# Patient Record
Sex: Female | Born: 1970 | Race: Black or African American | Hispanic: No | Marital: Single | Smoking: Current every day smoker
Health system: Southern US, Community
[De-identification: ages and names within clinical notes are randomized; demographics above are authoritative.]

## PROBLEM LIST (undated history)

## (undated) DIAGNOSIS — I639 Cerebral infarction, unspecified: Secondary | ICD-10-CM

## (undated) DIAGNOSIS — M51369 Other intervertebral disc degeneration, lumbar region without mention of lumbar back pain or lower extremity pain: Secondary | ICD-10-CM

## (undated) DIAGNOSIS — I219 Acute myocardial infarction, unspecified: Secondary | ICD-10-CM

## (undated) DIAGNOSIS — R0600 Dyspnea, unspecified: Secondary | ICD-10-CM

## (undated) DIAGNOSIS — I10 Essential (primary) hypertension: Secondary | ICD-10-CM

## (undated) DIAGNOSIS — E785 Hyperlipidemia, unspecified: Secondary | ICD-10-CM

## (undated) DIAGNOSIS — K219 Gastro-esophageal reflux disease without esophagitis: Secondary | ICD-10-CM

## (undated) DIAGNOSIS — M5136 Other intervertebral disc degeneration, lumbar region: Secondary | ICD-10-CM

## (undated) DIAGNOSIS — R0602 Shortness of breath: Secondary | ICD-10-CM

## (undated) DIAGNOSIS — E119 Type 2 diabetes mellitus without complications: Secondary | ICD-10-CM

## (undated) DIAGNOSIS — F32A Depression, unspecified: Secondary | ICD-10-CM

## (undated) DIAGNOSIS — F419 Anxiety disorder, unspecified: Secondary | ICD-10-CM

## (undated) DIAGNOSIS — T7840XA Allergy, unspecified, initial encounter: Secondary | ICD-10-CM

## (undated) DIAGNOSIS — N611 Abscess of the breast and nipple: Secondary | ICD-10-CM

## (undated) HISTORY — DX: Cerebral infarction, unspecified: I63.9

## (undated) HISTORY — PX: CARDIAC CATHETERIZATION: SHX172

## (undated) HISTORY — DX: Hyperlipidemia, unspecified: E78.5

## (undated) HISTORY — DX: Abscess of the breast and nipple: N61.1

## (undated) HISTORY — PX: TUBAL LIGATION: SHX77

---

## 2004-12-19 ENCOUNTER — Observation Stay: Payer: Self-pay | Admitting: Internal Medicine

## 2005-04-05 ENCOUNTER — Emergency Department: Payer: Self-pay | Admitting: Emergency Medicine

## 2005-04-23 ENCOUNTER — Emergency Department: Payer: Self-pay | Admitting: Emergency Medicine

## 2006-08-15 ENCOUNTER — Emergency Department: Payer: Self-pay | Admitting: Internal Medicine

## 2007-02-03 ENCOUNTER — Emergency Department: Payer: Self-pay | Admitting: Emergency Medicine

## 2008-05-09 DIAGNOSIS — R5383 Other fatigue: Secondary | ICD-10-CM | POA: Insufficient documentation

## 2008-09-12 DIAGNOSIS — E785 Hyperlipidemia, unspecified: Secondary | ICD-10-CM | POA: Insufficient documentation

## 2009-04-19 ENCOUNTER — Ambulatory Visit: Payer: Self-pay | Admitting: Family Medicine

## 2009-11-28 ENCOUNTER — Emergency Department: Payer: Self-pay | Admitting: Emergency Medicine

## 2010-10-23 ENCOUNTER — Emergency Department: Payer: Self-pay | Admitting: Emergency Medicine

## 2011-10-31 ENCOUNTER — Emergency Department: Payer: Self-pay | Admitting: Emergency Medicine

## 2012-01-23 DIAGNOSIS — K649 Unspecified hemorrhoids: Secondary | ICD-10-CM | POA: Insufficient documentation

## 2012-08-10 DIAGNOSIS — E559 Vitamin D deficiency, unspecified: Secondary | ICD-10-CM | POA: Insufficient documentation

## 2013-07-15 DIAGNOSIS — L732 Hidradenitis suppurativa: Secondary | ICD-10-CM | POA: Insufficient documentation

## 2013-09-22 DIAGNOSIS — I219 Acute myocardial infarction, unspecified: Secondary | ICD-10-CM

## 2013-09-22 HISTORY — DX: Acute myocardial infarction, unspecified: I21.9

## 2013-10-23 ENCOUNTER — Emergency Department: Payer: Self-pay | Admitting: Emergency Medicine

## 2013-10-23 LAB — CBC WITH DIFFERENTIAL/PLATELET
Basophil #: 0 10*3/uL (ref 0.0–0.1)
Basophil %: 0.3 %
EOS ABS: 0.1 10*3/uL (ref 0.0–0.7)
EOS PCT: 0.9 %
HCT: 42.1 % (ref 35.0–47.0)
HGB: 14.4 g/dL (ref 12.0–16.0)
LYMPHS ABS: 1.3 10*3/uL (ref 1.0–3.6)
LYMPHS PCT: 17 %
MCH: 31.6 pg (ref 26.0–34.0)
MCHC: 34 g/dL (ref 32.0–36.0)
MCV: 93 fL (ref 80–100)
MONOS PCT: 9.8 %
Monocyte #: 0.8 x10 3/mm (ref 0.2–0.9)
Neutrophil #: 5.6 10*3/uL (ref 1.4–6.5)
Neutrophil %: 72 %
PLATELETS: 224 10*3/uL (ref 150–440)
RBC: 4.54 10*6/uL (ref 3.80–5.20)
RDW: 13.5 % (ref 11.5–14.5)
WBC: 7.8 10*3/uL (ref 3.6–11.0)

## 2013-10-23 LAB — URINALYSIS, COMPLETE
BILIRUBIN, UR: NEGATIVE
BLOOD: NEGATIVE
Glucose,UR: >=500 mg/dL (ref 0–75)
LEUKOCYTE ESTERASE: NEGATIVE
NITRITE: NEGATIVE
PH: 6 (ref 4.5–8.0)
PROTEIN: NEGATIVE
RBC,UR: <1 /HPF (ref 0–5)
SPECIFIC GRAVITY: 1.021 (ref 1.003–1.030)
Squamous Epithelial: 7
WBC UR: 3 /HPF (ref 0–5)

## 2013-10-23 LAB — COMPREHENSIVE METABOLIC PANEL
ALBUMIN: 3.2 g/dL — AB (ref 3.4–5.0)
ALT: 54 U/L (ref 12–78)
AST: 38 U/L — AB (ref 15–37)
Alkaline Phosphatase: 115 U/L
Anion Gap: 5 — ABNORMAL LOW (ref 7–16)
BUN: 9 mg/dL (ref 7–18)
Bilirubin,Total: 0.7 mg/dL (ref 0.2–1.0)
Calcium, Total: 8.9 mg/dL (ref 8.5–10.1)
Chloride: 104 mmol/L (ref 98–107)
Co2: 25 mmol/L (ref 21–32)
Creatinine: 0.57 mg/dL — ABNORMAL LOW (ref 0.60–1.30)
EGFR (African American): >60
EGFR (Non-African Amer.): >60
GLUCOSE: 238 mg/dL — AB (ref 65–99)
OSMOLALITY: 275 (ref 275–301)
Potassium: 3.6 mmol/L (ref 3.5–5.1)
Sodium: 134 mmol/L — ABNORMAL LOW (ref 136–145)
Total Protein: 7.2 g/dL (ref 6.4–8.2)

## 2013-10-23 LAB — RAPID INFLUENZA A&B ANTIGENS

## 2013-10-25 LAB — BETA STREP CULTURE(ARMC)

## 2013-11-02 ENCOUNTER — Ambulatory Visit: Payer: Self-pay

## 2013-12-09 DIAGNOSIS — F332 Major depressive disorder, recurrent severe without psychotic features: Secondary | ICD-10-CM | POA: Insufficient documentation

## 2013-12-30 DIAGNOSIS — M5417 Radiculopathy, lumbosacral region: Secondary | ICD-10-CM | POA: Insufficient documentation

## 2014-03-08 DIAGNOSIS — G8929 Other chronic pain: Secondary | ICD-10-CM | POA: Insufficient documentation

## 2014-05-10 DIAGNOSIS — Z79899 Other long term (current) drug therapy: Secondary | ICD-10-CM | POA: Insufficient documentation

## 2014-06-15 ENCOUNTER — Inpatient Hospital Stay: Payer: Self-pay | Admitting: Internal Medicine

## 2014-06-15 LAB — CBC
HCT: 44 % (ref 35.0–47.0)
HGB: 14.4 g/dL (ref 12.0–16.0)
MCH: 30.1 pg (ref 26.0–34.0)
MCHC: 32.6 g/dL (ref 32.0–36.0)
MCV: 92 fL (ref 80–100)
Platelet: 325 10*3/uL (ref 150–440)
RBC: 4.77 10*6/uL (ref 3.80–5.20)
RDW: 13.3 % (ref 11.5–14.5)
WBC: 16 10*3/uL — AB (ref 3.6–11.0)

## 2014-06-15 LAB — DIFFERENTIAL
Basophil #: 0.1 10*3/uL (ref 0.0–0.1)
Basophil %: 0.4 %
Eosinophil #: 0.1 10*3/uL (ref 0.0–0.7)
Eosinophil %: 0.4 %
Lymphocyte #: 3.8 10*3/uL — ABNORMAL HIGH (ref 1.0–3.6)
Lymphocyte %: 24.9 %
Monocyte #: 0.9 x10 3/mm (ref 0.2–0.9)
Monocyte %: 5.9 %
NEUTROS PCT: 68.4 %
Neutrophil #: 10.3 10*3/uL — ABNORMAL HIGH (ref 1.4–6.5)

## 2014-06-15 LAB — BASIC METABOLIC PANEL
Anion Gap: 5 — ABNORMAL LOW (ref 7–16)
BUN: 22 mg/dL — ABNORMAL HIGH (ref 7–18)
CALCIUM: 9.3 mg/dL (ref 8.5–10.1)
CHLORIDE: 104 mmol/L (ref 98–107)
CO2: 26 mmol/L (ref 21–32)
CREATININE: 0.77 mg/dL (ref 0.60–1.30)
EGFR (African American): >60
EGFR (Non-African Amer.): >60
Glucose: 254 mg/dL — ABNORMAL HIGH (ref 65–99)
OSMOLALITY: 282 (ref 275–301)
Potassium: 3.9 mmol/L (ref 3.5–5.1)
Sodium: 135 mmol/L — ABNORMAL LOW (ref 136–145)

## 2014-06-15 LAB — D-DIMER(ARMC): D-Dimer: 367 ng/ml

## 2014-06-15 LAB — TROPONIN I
TROPONIN-I: 0.06 ng/mL — AB
Troponin-I: <0.02 ng/mL

## 2014-06-15 LAB — PROTIME-INR
INR: 1
Prothrombin Time: 12.8 secs (ref 11.5–14.7)

## 2014-06-15 LAB — PRO B NATRIURETIC PEPTIDE: B-TYPE NATIURETIC PEPTID: 16 pg/mL (ref 0–125)

## 2014-06-15 LAB — APTT: Activated PTT: 25.1 secs (ref 23.6–35.9)

## 2014-06-16 LAB — HEPARIN LEVEL (UNFRACTIONATED): Anti-Xa(Unfractionated): <0.1 IU/mL — ABNORMAL LOW (ref 0.30–0.70)

## 2014-06-16 LAB — TROPONIN I
TROPONIN-I: 0.16 ng/mL — AB
TROPONIN-I: 0.15 ng/mL — AB
Troponin-I: 0.12 ng/mL — ABNORMAL HIGH

## 2014-06-16 LAB — LIPID PANEL
CHOLESTEROL: 205 mg/dL — AB (ref 0–200)
HDL Cholesterol: 45 mg/dL (ref 40–60)
Ldl Cholesterol, Calc: 115 mg/dL — ABNORMAL HIGH (ref 0–100)
TRIGLYCERIDES: 227 mg/dL — AB (ref 0–200)
VLDL Cholesterol, Calc: 45 mg/dL — ABNORMAL HIGH (ref 5–40)

## 2014-06-16 LAB — CK-MB
CK-MB: 0.9 ng/mL (ref 0.5–3.6)
CK-MB: 1.5 ng/mL (ref 0.5–3.6)

## 2014-09-19 ENCOUNTER — Emergency Department: Payer: Self-pay | Admitting: Emergency Medicine

## 2014-09-20 LAB — CBC
HCT: 39.9 % (ref 35.0–47.0)
HGB: 13 g/dL (ref 12.0–16.0)
MCH: 31 pg (ref 26.0–34.0)
MCHC: 32.6 g/dL (ref 32.0–36.0)
MCV: 95 fL (ref 80–100)
Platelet: 281 10*3/uL (ref 150–440)
RBC: 4.2 10*6/uL (ref 3.80–5.20)
RDW: 13.1 % (ref 11.5–14.5)
WBC: 11.3 10*3/uL — AB (ref 3.6–11.0)

## 2014-09-20 LAB — BASIC METABOLIC PANEL
Anion Gap: 8 (ref 7–16)
BUN: 13 mg/dL (ref 7–18)
Calcium, Total: 8.9 mg/dL (ref 8.5–10.1)
Chloride: 104 mmol/L (ref 98–107)
Co2: 25 mmol/L (ref 21–32)
Creatinine: 0.67 mg/dL (ref 0.60–1.30)
EGFR (African American): >60
EGFR (Non-African Amer.): >60
Glucose: 259 mg/dL — ABNORMAL HIGH (ref 65–99)
Osmolality: 283 (ref 275–301)
Potassium: 3.6 mmol/L (ref 3.5–5.1)
Sodium: 137 mmol/L (ref 136–145)

## 2014-09-20 LAB — TROPONIN I
Troponin-I: <0.02 ng/mL
Troponin-I: <0.02 ng/mL

## 2014-12-14 DIAGNOSIS — F17200 Nicotine dependence, unspecified, uncomplicated: Secondary | ICD-10-CM | POA: Insufficient documentation

## 2015-01-13 NOTE — Discharge Summary (Signed)
PATIENT NAME:  Morgan Patel, Morgan Patel MR#:  [id number] DATE OF BIRTH:  07-May-1971  DATE OF ADMISSION:  06/15/2014 DATE OF DISCHARGE:  06/16/2014  ADMISSION DIAGNOSIS:  Non-ST elevation myocardial infarction.    DISCHARGE DIAGNOSES:   1.  Non-ST elevation myocardial infarction.  2.  Hypertension.   3.  Diabetes.   CONSULTATIONS:  Dr. Gwen Pounds.   PROCEDURES:   1.  The patient underwent a cardiac catheterization on 06/16/2014 which showed minimal CAD.   2.  2D echocardiogram showed a normal ejection fraction of 55%-60% with decreased left ventricular internal cavity size.  3.  Troponin max 0.16.  4.  Discharge troponin 0.15.   HOSPITAL COURSE:  A 44 year old female who presented with chest pain, found to have elevation in troponin. For further details please refer to H and P.    1.  Non-STEMI.  The patient was admitted to the hospitalist service. She was started on a heparin drip. She had a cardiac consultation. She underwent cardiac catheterization which showed preserved EF, however she has minimal CAD.  At this time medical management was indicated. She is on aspirin, statin, and beta blocker. She is chest pain-free and she will have followup in a couple of weeks with Dr. Gwen Pounds.  2.  Hypertensio ESSENTAIL:. The patient's blood pressure was controlled on her current medications.  3.  Diabetes not controlled well. The patient will continue her outpatient medications and needs close outpatient follow up. 4.  Leukocytosis on admission. This is, likely stress induced.   DISCHARGE MEDICATIONS:  1.  Hydralazine 100 mg p.o. t.i.d.  2.  Spironolactone 25 mg b.i.d.  3.  Glipizide 5 mg b.i.d.   4.  Verapamil 240 mg b.i.d.  5.  Metformin 500 mg b.i.d.  6.  Nitroglycerin sublingual p.r.n. chest pain.  7.  Aspirin 81 mg daily.  8.  Zocor 20 mg daily.   DISCHARGE DIET: Low sodium, ADA diet.   DISCHARGE ACTIVITY: As tolerated.   DISCHARGE FOLLOWUP: The patient will follow up in 2 weeks with Dr.  Gwen Pounds.   The patient is stable for discharge.   TIME SPENT: Approximately 45 minutes.   PHYSICAL EXAMINATION AT DISCHARGE:  VITAL SIGNS: Temperature 98, pulse is 71, respirations 20, blood pressure 146/86, 99% on room air.  GENERAL: The patient is alert, oriented, not in acute distress.  CARDIAC: Regular rate and rhythm. No murmurs, gallops, or rubs.  PMI is not displaced.   LUNGS: Clear to auscultation without crackles, rales, rhonchi, or wheezes.   ABDOMEN: Bowel sounds positive. Nontender,  nondistended. No hepatosplenomegaly.   EXTREMITIES:  No clubbing, cyanosis, or edema.  NEUROLOGIC:  Cranial nerves II-XII are intact. There are no focal deficits.   GROIN: The patient has  her incision at the right groin site without any bruits or bleeding.    TIME 38 minutes ____________________________ Donnis Pecha P. Juliene Pina, MD spm:bu D: 06/16/2014 18:38:17 ET T: 06/16/2014 20:37:21 ET JOB#: [id number]  cc: Ronnae Kaser P. Juliene Pina, MD, <Dictator> Lamar Blinks, MD Stacie Acres Cliffton Asters, MD Janyth Contes Franciszek Platten MD ELECTRONICALLY SIGNED 06/16/2014 21:53

## 2015-01-13 NOTE — H&P (Signed)
PATIENT NAME:  Morgan Patel, DUNFORD MR#:  [id number] DATE OF BIRTH:  01-27-1971  DATE OF ADMISSION:  06/15/2014  REFERRING PHYSICIAN: Dr. Darnelle Catalan.   PRIMARY CARE PHYSICIAN: Dr. Laurann Montana.   CHIEF COMPLAINT: Chest pain.   HISTORY OF PRESENT ILLNESS: A 44 year old African American female with type 2 diabetes, non-insulin-requiring, uncomplicated as well as  hypertension, presenting with chest pain. She describes chest pain, which has essentially been intermittent for 1 week total duration, retrosternal location, quality described as "pain," intensity 7 to 8 out of 10. Today; however, had radiation down the left arm with associated shortness of breath, nausea, diaphoresis, however, nonexertional. She denies any worsening or relieving factors and thus presented to the hospital for further work-up and evaluation. During her duration of her stay her cardiac enzymes trended upward thus she was started on heparin drip in the Emergency Department. Currently she is asymptomatic.   REVIEW OF SYSTEMS:  CONSTITUTIONAL: Denies fever, fatigue, weakness.  EYES: Denies blurred vision, double vision, eye pain.  EARS, NOSE, THROAT: Denies tinnitus, ear pain or hearing loss.  RESPIRATORY: Denies cough, wheeze, shortness of breath.  CARDIOVASCULAR: Positive for chest pain.  RESPIRATORY: Positive for shortness of breath as described above; however, denies any cough or wheeze.  CARDIOVASCULAR: Positive for chest pain as described above. Denies orthopnea, edema.  GASTROINTESTINAL: Positive for nausea. Denies vomiting, diarrhea, abdominal pain.  GENITOURINARY: Denies dysuria, hematuria. ENDOCRINE: Denies polyuria or thyroid problems.  HEMATOLOGIC: Denies easy bruising or bleeding.  SKIN: Denies rash or lesion. MUSCULOSKELETAL: Denies pain in the neck, back, shoulders, knees, hips or arthritic pain.  NEUROLOGIC: Denies paralysis or paresthesias.  PSYCHIATRIC: Denies anxiety or depressive symptoms.   PAST MEDICAL  HISTORY: Hypertension, type 2 diabetes non-insulin-requiring, uncomplicated.   SOCIAL HISTORY: Positive for tobacco use. Denies any alcohol or drug use.   FAMILY HISTORY: Positive for early onset coronary artery disease in her mother.   ALLERGIES: No known drug allergies.   HOME MEDICATIONS: Includes spironolactone 25 mg p.o. b.i.d., glipizide 5 mg p.o. b.i.d., hydralazine 100 mg p.o. 3 times daily.   PHYSICAL EXAMINATION:  VITAL SIGNS: Temperature 98.6, heart rate 88, respirations 20, blood pressure 189/106 on arrival, currently 153/96, saturating 98% on room air. Weight 127 kg, BUN 51.3.  GENERAL: Obese African American female, currently in no acute distress.  HEAD: Normocephalic, atraumatic.  EYES: Pupils are equal, round and reactive to light. Extraocular muscles intact. No scleral icterus.  MOUTH: Moist mucous membranes. Dentition intact. No abscess noted.  EARS, NOSE, THROAT: Clear without exudates. No external lesions.  NECK: Supple. No thyromegaly. No nodules. No JVD.  PULMONARY: Clear to auscultation bilaterally without wheezes, rales or rhonchi. No accessory muscles usage. Good respiratory effort.  CHEST: Nontender to palpation. CARDIOVASCULAR: S1, S2, regular rate and rhythm with no murmur, rubs or gallops. No edema. Pedal pulses 2+ bilaterally.   GASTROINTESTINAL: Soft, nontender, nondistended. No masses. Positive bowel sounds. No hepatosplenomegaly.  MUSCULOSKELETAL: No swelling, clubbing or edema. Range of motion full in all extremities.  NEUROLOGIC: Cranial nerves II-XII intact. No gross focal neurologic deficits. Sensation intact. Reflexes intact.  SKIN: No ulceration, lesions, rash, cyanosis. Skin warm, dry. Turgor intact.  PSYCHIATRIC: Mood and affect within normal limits. The patient is awake, alert, oriented x 3. Insight and judgment intact.   LABORATORY DATA: EKG performed normal sinus rhythm without ST or T wave abnormalities. Chest x-ray performed, reveals no acute  cardiopulmonary process. Remainder of laboratory data: Sodium 135, potassium 3.9, chloride 104, bicarbonate 26, BUN 22,  creatinine 0.77 glucose 254. Original troponin less than 0.02. Repeat troponin 0.06. WBC 16, hemoglobin 14.4, platelets of 325,000.   ASSESSMENT AND PLAN: A 44 year old history of type 2 diabetes non-insulin-requiring, uncomplicated, as well as hypertension who presents with chest pain.   1. N-STEMI indicated by positive troponins and chest pain. Initiate aspirin therapy. She has Nitroglycerine patch already in place. Heparin drip initiated in the Emergency Department. Will continue this for now. Trend cardiac enzymes. We will consult cardiology.  2. Type 2 diabetes, non-insulin-requiring, though poorly controlled. Hold p.o. agents. Add insulin sliding scale with q. 6 hour Accu-Cheks.  3. Hypertensive urgency without evidence of congestive heart failure. Restart home medications and add p.r.n. hydralazine IV as needed for systolic blood pressure greater than 180 or diastolic greater than 100.  4. Leukocytosis: No indication for antibiotics at this time.  5. Deep venous thrombosis prophylaxis. She is on heparin drip.   CODE STATUS: The patient is full code.   TIME SPENT: 45 minutes.    ____________________________ Cletis Athens. Hower, MD dkh:JT D: 06/16/2014 00:11:22 ET T: 06/16/2014 00:38:10 ET JOB#: [id number]  cc: Cletis Athens. Hower, MD, <Dictator> DAVID Synetta Shadow MD ELECTRONICALLY SIGNED 06/16/2014 20:20

## 2015-01-13 NOTE — Consult Note (Signed)
PATIENT NAME:  Morgan Patel, Morgan Patel MR#:  [id number] DATE OF BIRTH:  Mar 17, 1971  DATE OF CONSULTATION:  06/16/2014  REFERRING PHYSICIAN:  Dr. Allena Katz CONSULTING PHYSICIAN:  Lamar Blinks, MD  REASON FOR CONSULTATION: Acute subendocardial myocardial infarction, diabetes type 2, hypertension and hyperlipidemia.   CHIEF COMPLAINT: "I had chest pain."   HISTORY OF PRESENT ILLNESS: This is a 44 year old female with known diabetes type 2 without evidence of complication at this time and essential hypertension with mixed hyperlipidemia. These have been treated with appropriate insulin injection, as well as other medication management, and ACE inhibitor. The patient has had reasonable blood pressure control with these issues when she has had waxing and waning chest discomfort, substernal in nature, radiating into her back and left arm with and without physical activity over the last 3 days, relieved by rest and progressive in nature overall. She did have an EKG showing normal sinus rhythm and in addition of that an elevated troponin of 0.16 consistent with subendocardial myocardial infarction or NSTEMI. The patient has had appropriate heparin use and other beta blocker and stable at this time with no further episodes of chest discomfort.   REVIEW OF SYSTEMS: The remainder review of systems is negative for vision change, ringing in the ears, hearing loss, cough, congestion, heartburn, nausea, vomiting, diarrhea, bloody stools, stomach pain, extremity pain, leg weakness, cramping of the buttocks, known blood clots, headaches, blackouts, dizzy spells, nosebleeds, congestion, trouble swallowing, frequent urination, urination at night, muscle weakness, numbness, anxiety, depression, skin lesions or skin rashes.   PAST MEDICAL HISTORY: 1.  Essential hypertension.  2.  Uncomplicated diabetes type 2. 3.  Hyperlipidemia, mixed.   FAMILY HISTORY: Positive for mom having cardiovascular disease, diabetes and  hypertension.   SOCIAL HISTORY: She smokes and denies alcohol use.   ALLERGIES: As listed.   MEDICATIONS: As listed.   PHYSICAL EXAMINATION: VITAL SIGNS: Blood pressure is 126/68 bilaterally and heart rate 72 upright and reclining and regular.  GENERAL: She is a well-appearing female in no acute distress.  HEENT: No icterus, thyromegaly, ulcers, hemorrhage, or xanthelasma.  CARDIOVASCULAR: Regular rate and rhythm with normal S1 and S2, distant heart sounds. PMI is diffuse. Carotid upstroke normal without bruit. Jugular venous pressure is normal.  LUNGS: Few basilar crackles with few expiratory wheezes.  ABDOMEN: Soft and nontender without hepatosplenomegaly or masses. Abdominal aorta cannot be felt or heard.  EXTREMITIES: Show 2+ bilateral pulses in dorsal, pedal, radial and femoral arteries with trace lower extremity edema. No cyanosis, clubbing or ulcers.  NEUROLOGIC: She is oriented to time, place, and person with normal mood and affect.   ASSESSMENT: A 44 year old female with diabetes type 2 (uncomplicated), essential hypertension (mixed), and hyperlipidemia with elevated troponin consistent with non-ST-elevation myocardial infarction or subendocardial myocardial infarction with tobacco abuse needing further treatment options.   RECOMMENDATIONS: 1.  Heparin for further risk reduction in cardiovascular event.  2.  Aspirin 81 mg.  3.  Beta blocker and ACE inhibitor for treatment. 4.  The patient has had discussion of cardiovascular rehabilitation and other risk management.  5.  Proceed to cardiac catheterization to assess coronary anatomy and further treatment thereof as necessary. The patient understands the risks and benefits of cardiac catheterization. These include the possibility of death, stroke, heart attack, infection, bleeding, or blood clot. She is at low risk for conscious sedation.  6.  Echocardiogram for left ventricular systolic dysfunction and myocardial infarction. 7.   Hypertension control with ACE inhibitor and beta blocker. 8.  Diabetes  with insulin injection with goal hemoglobin A1c below 7. 9.  Hyperlipidemia with high intensity cholesterol therapy.  ____________________________ Lamar Blinks, MD bjk:sb D: 06/16/2014 08:34:00 ET T: 06/16/2014 10:33:02 ET JOB#: [id number]  cc: Lamar Blinks, MD, <Dictator> Lamar Blinks MD ELECTRONICALLY SIGNED 06/21/2014 15:47

## 2015-02-04 DIAGNOSIS — N611 Abscess of the breast and nipple: Secondary | ICD-10-CM | POA: Insufficient documentation

## 2015-02-04 HISTORY — DX: Abscess of the breast and nipple: N61.1

## 2015-09-02 ENCOUNTER — Emergency Department
Admission: EM | Admit: 2015-09-02 | Discharge: 2015-09-02 | Disposition: A | Discharge status: Home / Self Care | Payer: Medicaid Other | Attending: Emergency Medicine | Admitting: Emergency Medicine

## 2015-09-02 ENCOUNTER — Emergency Department: Payer: Medicaid Other

## 2015-09-02 DIAGNOSIS — H538 Other visual disturbances: Secondary | ICD-10-CM | POA: Insufficient documentation

## 2015-09-02 DIAGNOSIS — Z79899 Other long term (current) drug therapy: Secondary | ICD-10-CM | POA: Insufficient documentation

## 2015-09-02 DIAGNOSIS — R0989 Other specified symptoms and signs involving the circulatory and respiratory systems: Secondary | ICD-10-CM | POA: Insufficient documentation

## 2015-09-02 DIAGNOSIS — I10 Essential (primary) hypertension: Secondary | ICD-10-CM | POA: Insufficient documentation

## 2015-09-02 DIAGNOSIS — E119 Type 2 diabetes mellitus without complications: Secondary | ICD-10-CM | POA: Insufficient documentation

## 2015-09-02 DIAGNOSIS — R51 Headache: Secondary | ICD-10-CM | POA: Insufficient documentation

## 2015-09-02 DIAGNOSIS — R197 Diarrhea, unspecified: Secondary | ICD-10-CM | POA: Insufficient documentation

## 2015-09-02 DIAGNOSIS — R519 Headache, unspecified: Secondary | ICD-10-CM

## 2015-09-02 DIAGNOSIS — R200 Anesthesia of skin: Secondary | ICD-10-CM | POA: Insufficient documentation

## 2015-09-02 DIAGNOSIS — F17210 Nicotine dependence, cigarettes, uncomplicated: Secondary | ICD-10-CM | POA: Insufficient documentation

## 2015-09-02 DIAGNOSIS — R42 Dizziness and giddiness: Secondary | ICD-10-CM | POA: Insufficient documentation

## 2015-09-02 DIAGNOSIS — Z880 Allergy status to penicillin: Secondary | ICD-10-CM | POA: Insufficient documentation

## 2015-09-02 DIAGNOSIS — Z7984 Long term (current) use of oral hypoglycemic drugs: Secondary | ICD-10-CM | POA: Insufficient documentation

## 2015-09-02 DIAGNOSIS — R0602 Shortness of breath: Secondary | ICD-10-CM | POA: Insufficient documentation

## 2015-09-02 HISTORY — DX: Type 2 diabetes mellitus without complications: E11.9

## 2015-09-02 HISTORY — DX: Other intervertebral disc degeneration, lumbar region: M51.36

## 2015-09-02 HISTORY — DX: Other intervertebral disc degeneration, lumbar region without mention of lumbar back pain or lower extremity pain: M51.369

## 2015-09-02 HISTORY — DX: Essential (primary) hypertension: I10

## 2015-09-02 HISTORY — DX: Acute myocardial infarction, unspecified: I21.9

## 2015-09-02 MED ORDER — METOCLOPRAMIDE HCL 5 MG/ML IJ SOLN
10.0000 mg | Freq: Once | INTRAMUSCULAR | Status: AC
  Administered 2015-09-02: 10 mg via INTRAVENOUS
  Filled 2015-09-02: qty 2

## 2015-09-02 MED ORDER — SODIUM CHLORIDE 0.9 % IV BOLUS (SEPSIS)
500.0000 mL | INTRAVENOUS | Status: AC
  Administered 2015-09-02: 500 mL via INTRAVENOUS

## 2015-09-02 MED ORDER — DIPHENHYDRAMINE HCL 50 MG/ML IJ SOLN
25.0000 mg | Freq: Once | INTRAMUSCULAR | Status: AC
  Administered 2015-09-02: 25 mg via INTRAVENOUS
  Filled 2015-09-02: qty 1

## 2015-09-02 MED ORDER — DEXAMETHASONE SODIUM PHOSPHATE 10 MG/ML IJ SOLN
10.0000 mg | Freq: Once | INTRAMUSCULAR | Status: AC
  Administered 2015-09-02: 10 mg via INTRAVENOUS
  Filled 2015-09-02: qty 1

## 2015-09-02 MED ORDER — KETOROLAC TROMETHAMINE 30 MG/ML IJ SOLN
30.0000 mg | Freq: Once | INTRAMUSCULAR | Status: AC
  Administered 2015-09-02: 30 mg via INTRAVENOUS
  Filled 2015-09-02: qty 1

## 2015-09-02 NOTE — ED Notes (Signed)
22 G IV in R hand was removed at this time. Site clean,dry, and intact. Catheter intact on removal

## 2015-09-02 NOTE — ED Notes (Signed)
Patient transported to CT 

## 2015-09-02 NOTE — Discharge Instructions (Signed)
You have been seen in the Emergency Department (ED) for a headache that we believe is a form of migraine.  Please use Tylenol or Motrin as needed for symptoms, but only as written on the box, and take any regular medications that have been prescribed for you.  As we have discussed, please follow up with your doctor as soon as possible regarding todays ED visit and your headache symptoms.    Call your doctor or return to the Emergency Department (ED) if you have a worsening headache, sudden and severe headache, confusion, slurred speech, facial droop, weakness or numbness in any arm or leg, extreme fatigue, or other symptoms that concern you.   Migraine Headache A migraine headache is an intense, throbbing pain on one or both sides of your head. A migraine can last for 30 minutes to several hours. CAUSES  The exact cause of a migraine headache is not always known. However, a migraine may be caused when nerves in the brain become irritated and release chemicals that cause inflammation. This causes pain. Certain things may also trigger migraines, such as:  Alcohol.  Smoking.  Stress.  Menstruation.  Aged cheeses.  Foods or drinks that contain nitrates, glutamate, aspartame, or tyramine.  Lack of sleep.  Chocolate.  Caffeine.  Hunger.  Physical exertion.  Fatigue.  Medicines used to treat chest pain (nitroglycerine), birth control pills, estrogen, and some blood pressure medicines. SIGNS AND SYMPTOMS  Pain on one or both sides of your head.  Pulsating or throbbing pain.  Severe pain that prevents daily activities.  Pain that is aggravated by any physical activity.  Nausea, vomiting, or both.  Dizziness.  Pain with exposure to bright lights, loud noises, or activity.  General sensitivity to bright lights, loud noises, or smells. Before you get a migraine, you may get warning signs that a migraine is coming (aura). An aura may include:  Seeing flashing  lights.  Seeing bright spots, halos, or zigzag lines.  Having tunnel vision or blurred vision.  Having feelings of numbness or tingling.  Having trouble talking.  Having muscle weakness. DIAGNOSIS  A migraine headache is often diagnosed based on:  Symptoms.  Physical exam.  A CT scan or MRI of your head. These imaging tests cannot diagnose migraines, but they can help rule out other causes of headaches. TREATMENT Medicines may be given for pain and nausea. Medicines can also be given to help prevent recurrent migraines.  HOME CARE INSTRUCTIONS  Only take over-the-counter or prescription medicines for pain or discomfort as directed by your health care provider. The use of long-term narcotics is not recommended.  Lie down in a dark, quiet room when you have a migraine.  Keep a journal to find out what may trigger your migraine headaches. For example, write down:  What you eat and drink.  How much sleep you get.  Any change to your diet or medicines.  Limit alcohol consumption.  Quit smoking if you smoke.  Get 7-9 hours of sleep, or as recommended by your health care provider.  Limit stress.  Keep lights dim if bright lights bother you and make your migraines worse. SEEK IMMEDIATE MEDICAL CARE IF:   Your migraine becomes severe.  You have a fever.  You have a stiff neck.  You have vision loss.  You have muscular weakness or loss of muscle control.  You start losing your balance or have trouble walking.  You feel faint or pass out.  You have severe symptoms that are  different from your first symptoms. MAKE SURE YOU:   Understand these instructions.  Will watch your condition.  Will get help right away if you are not doing well or get worse.   This information is not intended to replace advice given to you by your health care provider. Make sure you discuss any questions you have with your health care provider.   Document Released: 09/08/2005 Document  Revised: 09/29/2014 Document Reviewed: 05/16/2013 Elsevier Interactive Patient Education [employer]

## 2015-09-02 NOTE — ED Notes (Addendum)
Pt c/o headache at 10 out of 10 with blurred vision, nausea, diarrhea, numbness of left hand, and numbness of entire right side, burning sensation in nose, SOB, dizziness and GERD. Pt denies vomiting.  Pt last bm was approximately 2 hours ago, and pt states: "it was straight liquid, other than a little bit of solid in the middle". Pt denies abdominal pain, changes in urination. Pt has a hx of migraines, but reports has not had any recently.

## 2015-09-02 NOTE — ED Provider Notes (Addendum)
Patient feels significantly better after resting, she reports her headache is improved. She has no blurry vision numbness or weakness. She is anxious to get home. Her CT head shows no acute abnormalities but does show some opacification of mastoid air cells but the patient has no symptoms of mastoiditis nor ear pain. We'll have her follow up with ENT for this likely chronic finding  Jene Every, MD 09/02/15 [id number]  Jene Every, MD 09/02/15 [phone]  Jene Every, MD 09/02/15 [phone]

## 2015-09-02 NOTE — ED Provider Notes (Addendum)
[facility] Emergency Department Provider Note  ____________________________________________  Time seen: Approximately 6:02 AM  I have reviewed the triage vital signs and the nursing notes.   HISTORY  Chief Complaint Headache; Numbness; and Dizziness    HPI Morgan Patel is a 44 y.o. female with a past medical history that includes diabetes, hypertension, elevated troponin/NSTEMI, and morbid obesity who presents with a variety of complaints that a been present for about 24 hours.  She reports that yesterday she awoke with a 10 out of 10 headache in the front of her head accompanied with some left sided facial numbness and blurry vision.  It is been steady and consistent since starting 24 hours ago.  She has also had some nausea, several episodes of loose stools, intermittent numbness of her left hand, numbness of "my whole right side", a burning sensation in her nose, intermittent shortness of breath, some dizziness/lightheadedness, and acid reflux.  She has had no vomiting.  Her last loose bowel movement was approximately 2 hours prior to arrival.  She has not had any abdominal pain.  She denies dysuria, fever/chills, chest pain.  She is currently having no shortness of breath.  She reports her symptoms as severe and her main cause of concern is her headache.  She states that she has had migraines in the past but this feels different.  She has had no motor dysfunction, specifically no weakness in any of her extremities.She takes no estrogen supplements and has never had a blood clot in the past.   Past Medical History  Diagnosis Date  . Hypertension   . Diabetes mellitus without complication (HCC)   . MI (myocardial infarction) (HCC) 2015  . DDD (degenerative disc disease), lumbar     There are no active problems to display for this patient.   No past surgical history on file.  Current Outpatient Rx  Name  Route  Sig  Dispense  Refill  . glipiZIDE  (GLUCOTROL) 10 MG tablet   Oral   Take 10 mg by mouth 2 (two) times daily before a meal.         . hydrALAZINE (APRESOLINE) 100 MG tablet   Oral   Take 100 mg by mouth 3 (three) times daily.         . metFORMIN (GLUCOPHAGE) 500 MG tablet   Oral   Take 500 mg by mouth 2 (two) times daily with a meal.         . spironolactone (ALDACTONE) 50 MG tablet   Oral   Take 50 mg by mouth 2 (two) times daily.         . verapamil (CALAN-SR) 240 MG CR tablet   Oral   Take 240 mg by mouth 2 (two) times daily.           Allergies Penicillins  Family History  Problem Relation Age of Onset  . Hypertension Mother   . Diabetes Mother   . Osteoarthritis Mother   . Hypertension Father   . Stroke Father   . Osteoarthritis Father   . Cancer Brother     Social History Social History  Substance Use Topics  . Smoking status: Current Every Day Smoker -- 0.50 packs/day    Types: Cigarettes  . Smokeless tobacco: Not on file  . Alcohol Use: No    Review of Systems Constitutional: No fever/chills Eyes: Blurry vision. ENT: Burning sensation in nose Cardiovascular: Denies chest pain. Respiratory: Diminished shortness of breath, now resolved Gastrointestinal: No abdominal  pain.  Nausea with no vomiting.  3 episodes of loose stools. Genitourinary: Negative for dysuria. Musculoskeletal: Negative for back pain. Skin: Negative for rash. Neurological: Severe frontal and somewhat left-sided headache with some blurred vision and numbness in the left side of her face and the right side of her body.  10-point ROS otherwise negative.  ____________________________________________   PHYSICAL EXAM:  VITAL SIGNS: ED Triage Vitals  Enc Vitals Group     BP 09/02/15 0532 158/113 mmHg     Pulse Rate 09/02/15 0532 103     Resp 09/02/15 0532 14     Temp 09/02/15 0532 98.9 F (37.2 C)     Temp Source 09/02/15 0532 Oral     SpO2 09/02/15 0532 99 %     Weight 09/02/15 0532 260 lb (117.935  kg)     Height 09/02/15 0532 5\' 2"  (1.575 m)     Head Cir --      Peak Flow --      Pain Score 09/02/15 0535 10     Pain Loc --      Pain Edu? --      Excl. in GC? --     Constitutional: Alert and oriented.  Though she has many concerns, she is in no apparent distress, interacting with me appropriately and laughing at jokes. Eyes: Conjunctivae are normal. PERRL. EOMI. no papilledema on funduscopic exam. Head: Atraumatic. Nose: No congestion/rhinnorhea. Mouth/Throat: Mucous membranes are moist.  Oropharynx non-erythematous. Neck: No stridor.  No meningismus. Cardiovascular: Normal rate, regular rhythm. Grossly normal heart sounds.  Good peripheral circulation. Respiratory: Normal respiratory effort.  No retractions. Lungs CTAB. Gastrointestinal: Soft and nontender. No distention. No abdominal bruits. No CVA tenderness. Musculoskeletal: No lower extremity tenderness nor edema.  No joint effusions. Neurologic:  Normal speech and language. No gross focal neurologic deficits are appreciated.  Strength is intact throughout her extremities.  No cranial nerve deficits are appreciated. Skin:  Skin is warm, dry and intact. No rash noted. Psychiatric: Mood and affect are normal. Speech and behavior are normal.  ____________________________________________   LABS (all labs ordered are listed, but only abnormal results are displayed)  Labs Reviewed - No data to display ____________________________________________  EKG  ED ECG REPORT I, Siearra Amberg, the attending physician, personally viewed and interpreted this ECG.  Date: 09/02/2015 EKG Time: 5:34 Rate: 102 Rhythm: Borderline sinus tachycardia QRS Axis: normal Intervals: normal ST/T Wave abnormalities: normal Conduction Disutrbances: none Narrative Interpretation: unremarkable  ____________________________________________  RADIOLOGY   No results  found.  ____________________________________________   PROCEDURES  Procedure(s) performed: None  Critical Care performed: No ____________________________________________   INITIAL IMPRESSION / ASSESSMENT AND PLAN / ED COURSE  Pertinent labs & imaging results that were available during my care of the patient were reviewed by me and considered in my medical decision making (see chart for details).  Differential diagnosis includes but is not exclusive to subarachnoid hemorrhage, meningitis, encephalitis, previous head trauma, cavernous/cerebral venous thrombosis, muscle tension headache, temporal arteritis, migraine or migraine equivalent, etc.  however, most of these are highly unlikely, especially in the absence of any obvious infectious process.  I suspect her symptoms are the result of a migraine or equivalent although cerebral venous thrombosis is certainly on my list.  The patient has borderline tachycardia but this is likely due to her discomfort.  I will treat her empirically as a migraine and obtain a noncontrast head CT for a general initial evaluation and then reassess the need for possible additional imaging.  I do not believe that her presentation warrants a lumbar puncture at this time.  ----------------------------------------- 7:03 AM on 09/02/2015 -----------------------------------------  Patient sleeping comfortably and easily, vital signs stable, awaiting results of head CT and reassessment afterwards.  Transferring ED care to Dr. Cyril Loosen.   ____________________________________________  FINAL CLINICAL IMPRESSION(S) / ED DIAGNOSES  Final diagnoses:  Nonintractable headache, unspecified chronicity pattern, unspecified headache type      NEW MEDICATIONS STARTED DURING THIS VISIT:  New Prescriptions   No medications on file     Loleta Rose, MD 09/02/15 [phone]

## 2015-09-23 DIAGNOSIS — I639 Cerebral infarction, unspecified: Secondary | ICD-10-CM

## 2015-09-23 HISTORY — DX: Cerebral infarction, unspecified: I63.9

## 2015-10-03 DIAGNOSIS — G4700 Insomnia, unspecified: Secondary | ICD-10-CM | POA: Insufficient documentation

## 2015-10-28 ENCOUNTER — Emergency Department
Admission: EM | Admit: 2015-10-28 | Discharge: 2015-10-28 | Disposition: A | Discharge status: Home / Self Care | Payer: Medicaid Other | Attending: Emergency Medicine | Admitting: Emergency Medicine

## 2015-10-28 ENCOUNTER — Encounter: Payer: Self-pay | Admitting: Emergency Medicine

## 2015-10-28 DIAGNOSIS — E119 Type 2 diabetes mellitus without complications: Secondary | ICD-10-CM | POA: Insufficient documentation

## 2015-10-28 DIAGNOSIS — Z7984 Long term (current) use of oral hypoglycemic drugs: Secondary | ICD-10-CM | POA: Insufficient documentation

## 2015-10-28 DIAGNOSIS — Z79899 Other long term (current) drug therapy: Secondary | ICD-10-CM | POA: Insufficient documentation

## 2015-10-28 DIAGNOSIS — R531 Weakness: Secondary | ICD-10-CM | POA: Insufficient documentation

## 2015-10-28 DIAGNOSIS — Z880 Allergy status to penicillin: Secondary | ICD-10-CM | POA: Insufficient documentation

## 2015-10-28 DIAGNOSIS — R519 Headache, unspecified: Secondary | ICD-10-CM

## 2015-10-28 DIAGNOSIS — H538 Other visual disturbances: Secondary | ICD-10-CM | POA: Insufficient documentation

## 2015-10-28 DIAGNOSIS — F17210 Nicotine dependence, cigarettes, uncomplicated: Secondary | ICD-10-CM | POA: Insufficient documentation

## 2015-10-28 DIAGNOSIS — R200 Anesthesia of skin: Secondary | ICD-10-CM | POA: Insufficient documentation

## 2015-10-28 DIAGNOSIS — R51 Headache: Secondary | ICD-10-CM | POA: Insufficient documentation

## 2015-10-28 DIAGNOSIS — R202 Paresthesia of skin: Secondary | ICD-10-CM | POA: Insufficient documentation

## 2015-10-28 DIAGNOSIS — I10 Essential (primary) hypertension: Secondary | ICD-10-CM | POA: Insufficient documentation

## 2015-10-28 DIAGNOSIS — G8929 Other chronic pain: Secondary | ICD-10-CM | POA: Insufficient documentation

## 2015-10-28 MED ORDER — METOCLOPRAMIDE HCL 10 MG PO TABS: 10.0000 mg | ORAL_TABLET | Freq: Three times a day (TID) | ORAL | Status: DC

## 2015-10-28 MED ORDER — DIPHENHYDRAMINE HCL 25 MG PO CAPS: 50.0000 mg | ORAL_CAPSULE | Freq: Four times a day (QID) | ORAL | Status: DC | PRN

## 2015-10-28 MED ORDER — DIVALPROEX SODIUM 500 MG PO DR TAB
500.0000 mg | DELAYED_RELEASE_TABLET | Freq: Once | ORAL | Status: AC
  Administered 2015-10-28: 500 mg via ORAL
  Filled 2015-10-28: qty 1

## 2015-10-28 MED ORDER — DIPHENHYDRAMINE HCL 25 MG PO CAPS
50.0000 mg | ORAL_CAPSULE | Freq: Once | ORAL | Status: AC
  Administered 2015-10-28: 50 mg via ORAL
  Filled 2015-10-28: qty 2

## 2015-10-28 MED ORDER — PREDNISONE 20 MG PO TABS
60.0000 mg | ORAL_TABLET | Freq: Once | ORAL | Status: AC
  Administered 2015-10-28: 60 mg via ORAL
  Filled 2015-10-28: qty 3

## 2015-10-28 MED ORDER — METOCLOPRAMIDE HCL 10 MG PO TABS
10.0000 mg | ORAL_TABLET | Freq: Once | ORAL | Status: AC
  Administered 2015-10-28: 10 mg via ORAL
  Filled 2015-10-28: qty 1

## 2015-10-28 NOTE — ED Notes (Signed)
Pt states hx of sinus blockage. Pt states she was dx on the 11th of December. Pt states worsening of the numbness of the whole face, worse on the R side. Pt also c/o HA and blurry vision and weakness at this time. Pt states the blurry vision and HA are not new to her, states that she has had those symptoms since Dec 11. Pt presents ambulatory in triage, alert and oriented, able to speak in full sentences, facial symmetry in tact at this time.

## 2015-10-28 NOTE — ED Notes (Signed)
MD at bedside. 

## 2015-10-28 NOTE — ED Provider Notes (Signed)
[facility] Emergency Department Provider Note  ____________________________________________  Time seen: 11:40 AM  I have reviewed the triage vital signs and the nursing notes.   HISTORY  Chief Complaint Headache; Numbness; and Facial Pain    HPI Morgan Patel is a 45 y.o. female who complains of bilateral headache and facial pain, right greater than left. Also associated with some blurry vision and generalized weakness. She also feels that her entire face and right-sided body is numb. This is all chronic for the past 2 months. She had an episode in December 11 which resolved spontaneously. She is followed up with her primary care doctor who is referred her to ENT and neurology per the patient's report. These appointments are coming up in the next 1-2 weeks. No trauma or recent illness or other changes. She's been compliant with her medications, but reports a recurrence of a similar episode from 2 months ago. No falls, denies any weakness.     Past Medical History  Diagnosis Date  . Hypertension   . Diabetes mellitus without complication (HCC)   . MI (myocardial infarction) (HCC) 2015  . DDD (degenerative disc disease), lumbar      There are no active problems to display for this patient.    No past surgical history on file.   Current Outpatient Rx  Name  Route  Sig  Dispense  Refill  . diphenhydrAMINE (BENADRYL) 25 mg capsule   Oral   Take 2 capsules (50 mg total) by mouth every 6 (six) hours as needed.   60 capsule   0   . glipiZIDE (GLUCOTROL) 10 MG tablet   Oral   Take 10 mg by mouth 2 (two) times daily before a meal.         . hydrALAZINE (APRESOLINE) 100 MG tablet   Oral   Take 100 mg by mouth 3 (three) times daily.         . metFORMIN (GLUCOPHAGE) 500 MG tablet   Oral   Take 500 mg by mouth 2 (two) times daily with a meal.         . metoCLOPramide (REGLAN) 10 MG tablet   Oral   Take 1 tablet (10 mg total) by mouth 4  (four) times daily -  before meals and at bedtime.   60 tablet   0   . spironolactone (ALDACTONE) 50 MG tablet   Oral   Take 50 mg by mouth 2 (two) times daily.         . verapamil (CALAN-SR) 240 MG CR tablet   Oral   Take 240 mg by mouth 2 (two) times daily.            Allergies Penicillins   Family History  Problem Relation Age of Onset  . Hypertension Mother   . Diabetes Mother   . Osteoarthritis Mother   . Hypertension Father   . Stroke Father   . Osteoarthritis Father   . Cancer Brother     Social History Social History  Substance Use Topics  . Smoking status: Current Every Day Smoker -- 0.50 packs/day    Types: Cigarettes  . Smokeless tobacco: None  . Alcohol Use: No    Review of Systems  Constitutional:   No fever or chills. No weight changes Eyes:   No blurry vision or double vision.  ENT:   No sore throat. Cardiovascular:   No chest pain. Respiratory:   No dyspnea or cough. Gastrointestinal:   Negative for abdominal  pain, vomiting and diarrhea.  No BRBPR or melena. Genitourinary:   Negative for dysuria, urinary retention, bloody urine, or difficulty urinating. Musculoskeletal:   Negative for back pain. No joint swelling or pain. Skin:   Negative for rash. Neurological:   Positive headache and paresthesias, no weakness. Psychiatric:  No anxiety or depression.   Endocrine:  No hot/cold intolerance, changes in energy, or sleep difficulty.  10-point ROS otherwise negative.  ____________________________________________   PHYSICAL EXAM:  VITAL SIGNS: ED Triage Vitals  Enc Vitals Group     BP 10/28/15 1133 155/110 mmHg     Pulse Rate 10/28/15 1133 95     Resp 10/28/15 1133 18     Temp 10/28/15 1133 98.8 F (37.1 C)     Temp Source 10/28/15 1133 Oral     SpO2 10/28/15 1133 98 %     Weight 10/28/15 1133 268 lb (121.564 kg)     Height 10/28/15 1133 5\' 2"  (1.575 m)     Head Cir --      Peak Flow --      Pain Score 10/28/15 1133 6      Pain Loc --      Pain Edu? --      Excl. in GC? --     Vital signs reviewed, nursing assessments reviewed.   Constitutional:   Alert and oriented. Well appearing and in no distress. Eyes:   No scleral icterus. No conjunctival pallor. PERRL. EOMI ENT   Head:   Normocephalic and atraumatic. TMs normal bilaterally   Nose:   No congestion/rhinnorhea. No septal hematoma   Mouth/Throat:   MMM, no pharyngeal erythema. No peritonsillar mass. No uvula shift.   Neck:   No stridor. No SubQ emphysema. No meningismus. Hematological/Lymphatic/Immunilogical:   No cervical lymphadenopathy. Cardiovascular:   RRR. Normal and symmetric distal pulses are present in all extremities. No murmurs, rubs, or gallops. Respiratory:   Normal respiratory effort without tachypnea nor retractions. Breath sounds are clear and equal bilaterally. No wheezes/rales/rhonchi. Gastrointestinal:   Soft and nontender. No distention. There is no CVA tenderness.  No rebound, rigidity, or guarding. Genitourinary:   deferred Musculoskeletal:   Nontender with normal range of motion in all extremities. No joint effusions.  No lower extremity tenderness.  No edema. Neurologic:   Normal speech and language.  CN 2-10 normal. Motor grossly intact. No pronator drift.  Normal gait. No gross focal neurologic deficits are appreciated.  Stroke scale is 0 Skin:    Skin is warm, dry and intact. No rash noted.  No petechiae, purpura, or bullae. Psychiatric:   Mood and affect are normal. Speech and behavior are normal. Patient exhibits appropriate insight and judgment.  ____________________________________________    LABS (pertinent positives/negatives) (all labs ordered are listed, but only abnormal results are displayed) Labs Reviewed - No data to display ____________________________________________   EKG  Normal sinus rhythm rate of 100, normal axis intervals QRS and ST segments and T  waves  ____________________________________________    RADIOLOGY    ____________________________________________   PROCEDURES   ____________________________________________   INITIAL IMPRESSION / ASSESSMENT AND PLAN / ED COURSE  Pertinent labs & imaging results that were available during my care of the patient were reviewed by me and considered in my medical decision making (see chart for details).  Patient presents with recurrent nonspecific headache. It is bilateral as are the paresthesias. She has no lateralizing acute symptoms. Workup done previously was unremarkable, and I see no reason to think that this is  any different or that we should repeat any of those tests or escalate the workup at this time. We'll treat symptomatically and if she is feeling better discharge home to follow up with primary care and outpatient specialist. Low suspicion for meningitis encephalitis temporal arteritis glaucoma intracranial thrombosis optic neuritis.    ----------------------------------------- 1:18 PM on 10/28/2015 -----------------------------------------  Feeling better. Now complains only of some mild perioral numbness but otherwise her symptoms are resolved. This may represent an atypical migraine. We'll discharge home to follow up with ENT and neurology.     ____________________________________________   FINAL CLINICAL IMPRESSION(S) / ED DIAGNOSES  Final diagnoses:  Nonintractable episodic headache, unspecified headache type      Sharman Cheek, MD 10/28/15 1318

## 2015-10-28 NOTE — Discharge Instructions (Signed)

## 2015-11-15 DIAGNOSIS — K219 Gastro-esophageal reflux disease without esophagitis: Secondary | ICD-10-CM | POA: Insufficient documentation

## 2015-11-28 DIAGNOSIS — I639 Cerebral infarction, unspecified: Secondary | ICD-10-CM | POA: Insufficient documentation

## 2015-11-29 DIAGNOSIS — F17200 Nicotine dependence, unspecified, uncomplicated: Secondary | ICD-10-CM | POA: Insufficient documentation

## 2016-01-31 DIAGNOSIS — E118 Type 2 diabetes mellitus with unspecified complications: Secondary | ICD-10-CM | POA: Insufficient documentation

## 2016-01-31 DIAGNOSIS — I10 Essential (primary) hypertension: Secondary | ICD-10-CM | POA: Insufficient documentation

## 2016-02-06 DIAGNOSIS — G8929 Other chronic pain: Secondary | ICD-10-CM | POA: Insufficient documentation

## 2016-02-06 DIAGNOSIS — M7918 Myalgia, other site: Secondary | ICD-10-CM

## 2016-05-19 DIAGNOSIS — H547 Unspecified visual loss: Secondary | ICD-10-CM | POA: Insufficient documentation

## 2016-05-19 DIAGNOSIS — I651 Occlusion and stenosis of basilar artery: Secondary | ICD-10-CM | POA: Insufficient documentation

## 2016-05-19 DIAGNOSIS — I69398 Other sequelae of cerebral infarction: Secondary | ICD-10-CM

## 2016-05-26 DIAGNOSIS — M5136 Other intervertebral disc degeneration, lumbar region: Secondary | ICD-10-CM | POA: Insufficient documentation

## 2016-05-26 DIAGNOSIS — M47816 Spondylosis without myelopathy or radiculopathy, lumbar region: Secondary | ICD-10-CM | POA: Insufficient documentation

## 2016-06-13 DIAGNOSIS — F329 Major depressive disorder, single episode, unspecified: Secondary | ICD-10-CM | POA: Insufficient documentation

## 2016-11-10 DIAGNOSIS — Z716 Tobacco abuse counseling: Secondary | ICD-10-CM | POA: Insufficient documentation

## 2017-05-28 ENCOUNTER — Encounter: Payer: Self-pay | Admitting: Nurse Practitioner

## 2017-05-28 ENCOUNTER — Ambulatory Visit: Payer: Medicaid Other | Attending: Nurse Practitioner | Admitting: Nurse Practitioner

## 2017-05-28 VITALS — BP 169/87 | HR 100 | Temp 98.0°F | Resp 16 | Ht 64.0 in | Wt 255.8 lb

## 2017-05-28 DIAGNOSIS — M069 Rheumatoid arthritis, unspecified: Secondary | ICD-10-CM | POA: Insufficient documentation

## 2017-05-28 DIAGNOSIS — G894 Chronic pain syndrome: Secondary | ICD-10-CM | POA: Diagnosis not present

## 2017-05-28 DIAGNOSIS — M542 Cervicalgia: Secondary | ICD-10-CM

## 2017-05-28 DIAGNOSIS — M5441 Lumbago with sciatica, right side: Secondary | ICD-10-CM

## 2017-05-28 DIAGNOSIS — Z79891 Long term (current) use of opiate analgesic: Secondary | ICD-10-CM

## 2017-05-28 DIAGNOSIS — Z8249 Family history of ischemic heart disease and other diseases of the circulatory system: Secondary | ICD-10-CM | POA: Insufficient documentation

## 2017-05-28 DIAGNOSIS — I2510 Atherosclerotic heart disease of native coronary artery without angina pectoris: Secondary | ICD-10-CM | POA: Diagnosis not present

## 2017-05-28 DIAGNOSIS — H5340 Unspecified visual field defects: Secondary | ICD-10-CM | POA: Diagnosis not present

## 2017-05-28 DIAGNOSIS — M546 Pain in thoracic spine: Secondary | ICD-10-CM

## 2017-05-28 DIAGNOSIS — Z7984 Long term (current) use of oral hypoglycemic drugs: Secondary | ICD-10-CM | POA: Diagnosis not present

## 2017-05-28 DIAGNOSIS — Z823 Family history of stroke: Secondary | ICD-10-CM | POA: Insufficient documentation

## 2017-05-28 DIAGNOSIS — I252 Old myocardial infarction: Secondary | ICD-10-CM | POA: Diagnosis not present

## 2017-05-28 DIAGNOSIS — Z8261 Family history of arthritis: Secondary | ICD-10-CM | POA: Diagnosis not present

## 2017-05-28 DIAGNOSIS — I10 Essential (primary) hypertension: Secondary | ICD-10-CM | POA: Insufficient documentation

## 2017-05-28 DIAGNOSIS — Z888 Allergy status to other drugs, medicaments and biological substances status: Secondary | ICD-10-CM | POA: Insufficient documentation

## 2017-05-28 DIAGNOSIS — M533 Sacrococcygeal disorders, not elsewhere classified: Secondary | ICD-10-CM | POA: Diagnosis not present

## 2017-05-28 DIAGNOSIS — Z8673 Personal history of transient ischemic attack (TIA), and cerebral infarction without residual deficits: Secondary | ICD-10-CM | POA: Insufficient documentation

## 2017-05-28 DIAGNOSIS — R5382 Chronic fatigue, unspecified: Secondary | ICD-10-CM | POA: Insufficient documentation

## 2017-05-28 DIAGNOSIS — G8929 Other chronic pain: Secondary | ICD-10-CM | POA: Diagnosis not present

## 2017-05-28 DIAGNOSIS — E119 Type 2 diabetes mellitus without complications: Secondary | ICD-10-CM | POA: Diagnosis not present

## 2017-05-28 DIAGNOSIS — M5442 Lumbago with sciatica, left side: Secondary | ICD-10-CM

## 2017-05-28 DIAGNOSIS — F17210 Nicotine dependence, cigarettes, uncomplicated: Secondary | ICD-10-CM | POA: Diagnosis not present

## 2017-05-28 DIAGNOSIS — Z833 Family history of diabetes mellitus: Secondary | ICD-10-CM | POA: Insufficient documentation

## 2017-05-28 DIAGNOSIS — M545 Low back pain: Secondary | ICD-10-CM

## 2017-05-28 DIAGNOSIS — M79601 Pain in right arm: Secondary | ICD-10-CM | POA: Diagnosis not present

## 2017-05-28 DIAGNOSIS — Z7982 Long term (current) use of aspirin: Secondary | ICD-10-CM | POA: Diagnosis not present

## 2017-05-28 DIAGNOSIS — M79602 Pain in left arm: Secondary | ICD-10-CM | POA: Diagnosis not present

## 2017-05-28 DIAGNOSIS — Z880 Allergy status to penicillin: Secondary | ICD-10-CM | POA: Insufficient documentation

## 2017-05-28 DIAGNOSIS — E785 Hyperlipidemia, unspecified: Secondary | ICD-10-CM | POA: Diagnosis not present

## 2017-05-28 DIAGNOSIS — Z809 Family history of malignant neoplasm, unspecified: Secondary | ICD-10-CM | POA: Insufficient documentation

## 2017-05-28 NOTE — Patient Instructions (Addendum)
____________________________________________________________________________________________  Appointment Policy Summary  It is our goal and responsibility to provide the medical community with assistance in the evaluation and management of patients with chronic pain. Unfortunately our resources are limited. Because we do not have an unlimited amount of time, or available appointments, we are required to closely monitor and manage their use. The following rules exist to maximize their use:  Patient's responsibilities: 1. Punctuality:  At what time should I arrive? You should be physically present in our office 30 minutes before your scheduled appointment. Your scheduled appointment is with your assigned healthcare provider. However, it takes 5-10 minutes to be "checked-in", and another 15 minutes for the nurses to do the admission. If you arrive to our office at the time you were given for your appointment, you will end up being at least 20-25 minutes late to your appointment with the provider. 2. Tardiness:  What happens if I arrive only a few minutes after my scheduled appointment time? You will need to reschedule your appointment. The cutoff is your appointment time. This is why it is so important that you arrive at least 30 minutes before that appointment. If you have an appointment scheduled for 10:00 AM and you arrive at 10:01, you will be required to reschedule your appointment.  3. Plan ahead:  Always assume that you will encounter traffic on your way in. Plan for it. If you are dependent on a driver, make sure they understand these rules and the need to arrive early. 4. Other appointments and responsibilities:  Avoid scheduling any other appointments before or after your pain clinic appointments.  5. Be prepared:  Write down everything that you need to discuss with your healthcare provider and give this information to the admitting nurse. Write down the medications that you will need  refilled. Bring your pills and bottles (even the empty ones), to all of your appointments, except for those where a procedure is scheduled. 6. No children or pets:  Find someone to take care of them. It is not appropriate to bring them in. 7. Scheduling changes:  We request "advanced notification" of any changes or cancellations. 8. Advanced notification:  Defined as a time period of more than 24 hours prior to the originally scheduled appointment. This allows for the appointment to be offered to other patients. 9. Rescheduling:  When a visit is rescheduled, it will require the cancellation of the original appointment. For this reason they both fall within the category of "Cancellations".  10. Cancellations:  They require advanced notification. Any cancellation less than 24 hours before the  appointment will be recorded as a "No Show". 11. No Show:  Defined as an unkept appointment where the patient failed to notify or declare to the practice their intention or inability to keep the appointment.  Corrective process for repeat offenders:  1. Tardiness: Three (3) episodes of rescheduling due to late arrivals will be recorded as one (1) "No Show". 2. Cancellation or reschedule: Three (3) cancellations or rescheduling will be recorded as one (1) "No Show". 3. "No Shows": Three (3) "No Shows" within a 12 month period will result in discharge from the practice.  ____________________________________________________________________________________________  ____________________________________________________________________________________________  Pain Scale  Introduction: The pain score used by this practice is the Verbal Numerical Rating Scale (VNRS-11). This is an 11-point scale. It is for adults and children 10 years or older. There are significant differences in how the pain score is reported, used, and applied. Forget everything you learned in the past and  learn this scoring  system.  General Information: The scale should reflect your current level of pain. Unless you are specifically asked for the level of your worst pain, or your average pain. If you are asked for one of these two, then it should be understood that it is over the past 24 hours.  Basic Activities of Daily Living (ADL): Personal hygiene, dressing, eating, transferring, and using restroom.  Instructions: Most patients tend to report their level of pain as a combination of two factors, their physical pain and their psychosocial pain. This last one is also known as "suffering" and it is reflection of how physical pain affects you socially and psychologically. From now on, report them separately. From this point on, when asked to report your pain level, report only your physical pain. Use the following table for reference.  Pain Clinic Pain Levels (0-5/10)  Pain Level Score  Description  No Pain 0   Mild pain 1 Nagging, annoying, but does not interfere with basic activities of daily living (ADL). Patients are able to eat, bathe, get dressed, toileting (being able to get on and off the toilet and perform personal hygiene functions), transfer (move in and out of bed or a chair without assistance), and maintain continence (able to control bladder and bowel functions). Blood pressure and heart rate are unaffected. A normal heart rate for a healthy adult ranges from 60 to 100 bpm (beats per minute).   Mild to moderate pain 2 Noticeable and distracting. Impossible to hide from other people. More frequent flare-ups. Still possible to adapt and function close to normal. It can be very annoying and may have occasional stronger flare-ups. With discipline, patients may get used to it and adapt.   Moderate pain 3 Interferes significantly with activities of daily living (ADL). It becomes difficult to feed, bathe, get dressed, get on and off the toilet or to perform personal hygiene functions. Difficult to get in and out of  bed or a chair without assistance. Very distracting. With effort, it can be ignored when deeply involved in activities.   Moderately severe pain 4 Impossible to ignore for more than a few minutes. With effort, patients may still be able to manage work or participate in some social activities. Very difficult to concentrate. Signs of autonomic nervous system discharge are evident: dilated pupils (mydriasis); mild sweating (diaphoresis); sleep interference. Heart rate becomes elevated (>115 bpm). Diastolic blood pressure (lower number) rises above 100 mmHg. Patients find relief in laying down and not moving.   Severe pain 5 Intense and extremely unpleasant. Associated with frowning face and frequent crying. Pain overwhelms the senses.  Ability to do any activity or maintain social relationships becomes significantly limited. Conversation becomes difficult. Pacing back and forth is common, as getting into a comfortable position is nearly impossible. Pain wakes you up from deep sleep. Physical signs will be obvious: pupillary dilation; increased sweating; goosebumps; brisk reflexes; cold, clammy hands and feet; nausea, vomiting or dry heaves; loss of appetite; significant sleep disturbance with inability to fall asleep or to remain asleep. When persistent, significant weight loss is observed due to the complete loss of appetite and sleep deprivation.  Blood pressure and heart rate becomes significantly elevated. Caution: If elevated blood pressure triggers a pounding headache associated with blurred vision, then the patient should immediately seek attention at an urgent or emergency care unit, as these may be signs of an impending stroke.    Emergency Department Pain Levels (6-10/10)  Emergency Room Pain 6   Severely limiting. Requires emergency care and should not be seen or managed at an outpatient pain management facility. Communication becomes difficult and requires great effort. Assistance to reach the  emergency department may be required. Facial flushing and profuse sweating along with potentially dangerous increases in heart rate and blood pressure will be evident.   Distressing pain 7 Self-care is very difficult. Assistance is required to transport, or use restroom. Assistance to reach the emergency department will be required. Tasks requiring coordination, such as bathing and getting dressed become very difficult.   Disabling pain 8 Self-care is no longer possible. At this level, pain is disabling. The individual is unable to do even the most "basic" activities such as walking, eating, bathing, dressing, transferring to a bed, or toileting. Fine motor skills are lost. It is difficult to think clearly.   Incapacitating pain 9 Pain becomes incapacitating. Thought processing is no longer possible. Difficult to remember your own name. Control of movement and coordination are lost.   The worst pain imaginable 10 At this level, most patients pass out from pain. When this level is reached, collapse of the autonomic nervous system occurs, leading to a sudden drop in blood pressure and heart rate. This in turn results in a temporary and dramatic drop in blood flow to the brain, leading to a loss of consciousness. Fainting is one of the body's self defense mechanisms. Passing out puts the brain in a calmed state and causes it to shut down for a while, in order to begin the healing process.    Summary: 1. Refer to this scale when providing [place] with your pain level. 2. Be accurate and careful when reporting your pain level. This will help with your care. 3. Over-reporting your pain level will lead to loss of credibility. 4. Even a level of 1/10 means that there is pain and will be treated at our facility. 5. High, inaccurate reporting will be documented as "Symptom Exaggeration", leading to loss of credibility and suspicions of possible secondary gains such as obtaining more narcotics, or wanting to appear  disabled, for fraudulent reasons. 6. Only pain levels of 5 or below will be seen at our facility. 7. Pain levels of 6 and above will be sent to the Emergency Department and the appointment cancelled. ____________________________________________________________________________________________   BMI Assessment: Estimated body mass index is 43.91 kg/m as calculated from the following:   Height as of this encounter: 5\' 4"  (1.626 m).   Weight as of this encounter: 255 lb 12.8 oz (116 kg).  BMI interpretation table: BMI level Category Range association with higher incidence of chronic pain  <18 kg/m2 Underweight   18.5-24.9 kg/m2 Ideal body weight   25-29.9 kg/m2 Overweight Increased incidence by 20%  30-34.9 kg/m2 Obese (Class I) Increased incidence by 68%  35-39.9 kg/m2 Severe obesity (Class II) Increased incidence by 136%  >40 kg/m2 Extreme obesity (Class III) Increased incidence by 254%   BMI Readings from Last 4 Encounters:  05/28/17 43.91 kg/m  10/28/15 49.02 kg/m  09/02/15 47.55 kg/m   Wt Readings from Last 4 Encounters:  05/28/17 255 lb 12.8 oz (116 kg)  10/28/15 268 lb (121.6 kg)  09/02/15 260 lb (117.9 kg)

## 2017-05-28 NOTE — Progress Notes (Signed)
Patient's Name: Morgan Patel  MRN: [id number]  Referring Provider: Inge Rise, NP  DOB: 1971-05-19  PCP: Inge Rise, NP  DOS: 05/28/2017  Note by: Dionisio David NP  Service setting: Ambulatory outpatient  Specialty: Interventional Pain Management  Location: [facility] (AMB) Pain Management Facility    Patient type: New Patient    Primary Reason(s) for Visit: Initial Patient Evaluation CC: Neck Pain (left); Joint Pain (arthritis); and Back Pain (lower bilateral)  HPI  Ms. Creegan is a 46 y.o. year old, female patient, who comes today for an initial evaluation. She has Abscess of breast, right; Basilar artery stenosis; Cervical spondylosis; Chronic pain; Coronary artery disease; Degenerative disc disease, lumbar; Encounter for long-term (current) use of other high-risk medications; Encounter for tobacco use cessation counseling; Essential hypertension; Gastroesophageal reflux disease; Ischemic stroke ([place]); Lumbar facet arthropathy ([place]); Lumbar spinal stenosis; Lumbosacral radiculopathy; Major depressive disorder with current active episode; Myofascial pain; Nicotine dependence, uncomplicated; Other chronic pain; Tobacco use disorder; Type 2 diabetes mellitus with complication, without long-term current use of insulin ([place]); Visual field loss following stroke; Chronic neck pain (Primary Area of Pain) (L>R); Chronic upper extremity pain  (Tertiary Area of Pain) (L>R); Chronic thoracic back pain  (Secondary Area of Pain)(L>R); Chronic low back pain (Fourth Area of Pain) (L>R); Chronic pain syndrome; Long term current use of opiate analgesic; Long term prescription opiate use; Opiate use; and Chronic sacroiliac joint pain on her problem list.. Her primarily concern today is the Neck Pain (left); Joint Pain (arthritis); and Back Pain (lower bilateral)  Pain Assessment: Location: Left Neck (back and joint pain) Radiating: radiating into shoulder area and down left arm Onset: More than a month  ago Duration: Chronic pain Quality: Numbness, Tingling, Constant, Radiating Severity: 5 /10 (self-reported pain score)  Note: Reported level is compatible with observation.                   Effect on ADL: increased activity increases back pain or different positions or movements.  neck pain is pretty constant, states it is a pinched nerve Timing: Constant Modifying factors: laying down and pain medication makes back better, stretches help neck and patient states there is a popping sensation that she can feel and hear  Onset and Duration: Gradual and Date of onset: 2011 Cause of pain: Unknown Severity: Getting worse Timing: During activity or exercise Aggravating Factors: Bending, Climbing, Kneeling, Lifiting, Prolonged sitting, Prolonged standing, Squatting, Stooping , Twisting, Walking and Working Alleviating Factors: Hot packs, Medications, Resting, Sleeping and Warm showers or baths Associated Problems: Day-time cramps, Depression, Fatigue, Inability to concentrate, Nausea, Numbness, Personality changes, Spasms, Swelling, Temperature changes, Tingling and Weakness Quality of Pain: Aching, Annoying, Cramping, Deep, Exhausting, Horrible, Nagging, Sharp and Uncomfortable Previous Examinations or Tests: MRI scan Previous Treatments: The patient denies any  The patient comes into the clinics today for the first time for a chronic pain management evaluation. According to the patient her primary area of pain is in her neck. She admits that the left side is greater than the right. She radicular pains that go into her shoulder. She denies any previous surgery, interventional therapy or physical therapy.   Her second area of pain is in her upper back. She feels that this also radiates down into her shoulders. She denies any previous surgeries, interventional therapy or physical therapy  Her third area of pain is in her upper extremities. She denies any numbness tingling or weakness. She denies any  previous surgeries, interventional therapy  or physical therapy.  Her next area of pain is in her lower back. She admits the left is greater than the right. She does have occasional radicular symptoms. She denies any numbness or tingling. She describes aching pain with some weakness. She admits that she does have occasional falls contribute to loss of balance. She did have PT for balance and safety in the past.   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 She admits that she has had multiple strokes the last year She also suffered an MI about 3-4 years ago.  Today I took the time to provide the patient with information regarding this pain practice. The patient was informed that the practice is divided into two sections: an interventional pain management section, as well as a completely separate and distinct medication management section. I explained that there are procedure days for interventional therapies, and evaluation days for follow-ups and medication management. Because of the amount of documentation required during both, they are kept separated. This means that there is the possibility that she may be scheduled for a procedure on one day, and medication management the next. I have also informed her that because of staffing and facility limitations, this practice will no longer take patients for medication management only. To illustrate the reasons for this, I gave the patient the example of surgeons, and how inappropriate it would be to refer a patient to his/her care, just to write for the post-surgical antibiotics on a surgery done by a different surgeon.   Because interventional pain management is part of the board-certified specialty for the doctors, the patient was informed that joining this practice means that they are open to any and all interventional therapies. I made it clear that this does not mean that they will be forced to have any procedures done. What this means is that I believe  interventional therapies to be essential part of the diagnosis and proper management of chronic pain conditions. Therefore, patients not interested in these interventional alternatives will be better served under the care of a different practitioner.  The patient was also made aware of my Comprehensive Pain Management Safety Guidelines where by joining this practice, they limit all of their nerve blocks and joint injections to those done by our practice, for as long as we are retained to manage their care. Historic Controlled Substance Pharmacotherapy Review  PMP and historical list of controlled substances:Lyrica 150 mg, hydrocodone/acetaminophen 10/325 mg,hydrocodone/acetaminophen 5/325 mg Lyrica 50 mg, oxycodone/acetaminophen 5/325 mg, lorazepam 0.5 mg, fentanyl 12 g hydrocodone/acetaminophen 5/539m Highest opioid analgesic regimen found: oxycodone/acetaminophen 10/325 mg every 4 hours (fill date 10/31/2011)) Oxycodone 60 mg per day) Most recent opioid analgesic:hydrocodone/acetaminophen 10/325 mg 1 tablet daily (fill date 04/09/2017) hydrocodone 10 mg per day Current opioid analgesics:none Highest recorded MME/day: 90 mg/day MME/day:10 mg/day Medications: The patient did not bring the medication(s) to the appointment, as requested in our "New Patient Package" Pharmacodynamics: Desired effects: Analgesia: The patient reports >50% benefit. Reported improvement in function: The patient reports medication allows her to accomplish basic ADLs. Clinically meaningful improvement in function (CMIF): Sustained CMIF goals met Perceived effectiveness: Described as relatively effective, allowing for increase in activities of daily living (ADL) Undesirable effects: Side-effects or Adverse reactions: None reported Historical Monitoring: The patient  reports that she does not use drugs. List of all UDS Test(s): No results found for: MDMA, COCAINSCRNUR, PCPSCRNUR, PCPQUANT, CANNABQUANT, THCU, EO[place]stList of  all Serum Drug Screening Test(s):  No results found for: AMPHSCRSER, BHarrold Donath  COCAINSCRSER, PCPSCRSER, PCPQUANT, THCSCRSER, CANNABQUANT, OPIATESCRSER, OXYSCRSER, PROPOXSCRSER Historical Background Evaluation: [place] PDMP: Six (6) year initial data search conducted.             [place] Department of public safety, offender search: Editor, commissioning Information) Non-contributory Risk Assessment Profile: Aberrant behavior: None observed or detected today Risk factors for fatal opioid overdose: None identified today Fatal overdose hazard ratio (HR): Calculation deferred Non-fatal overdose hazard ratio (HR): Calculation deferred Risk of opioid abuse or dependence: 0.7-3.0% with doses ? 36 MME/day and 6.1-26% with doses ? 120 MME/day. Substance use disorder (SUD) risk level: Pending results of Medical Psychology Evaluation for SUD Opioid risk tool (ORT) (Total Score): 3  ORT Scoring interpretation table:  Score <3 = Low Risk for SUD  Score between 4-7 = Moderate Risk for SUD  Score >8 = High Risk for Opioid Abuse   PHQ-2 Depression Scale:  Total score: 0  PHQ-2 Scoring interpretation table: (Score and probability of major depressive disorder)  Score 0 = No depression  Score 1 = 15.4% Probability  Score 2 = 21.1% Probability  Score 3 = 38.4% Probability  Score 4 = 45.5% Probability  Score 5 = 56.4% Probability  Score 6 = 78.6% Probability   PHQ-9 Depression Scale:  Total score: 0  PHQ-9 Scoring interpretation table:  Score 0-4 = No depression  Score 5-9 = Mild depression  Score 10-14 = Moderate depression  Score 15-19 = Moderately severe depression  Score 20-27 = Severe depression (2.4 times higher risk of SUD and 2.89 times higher risk of overuse)   Pharmacologic Plan: Pending ordered tests and/or consults  Meds  The patient has a current medication list which includes the following prescription(s): atorvastatin, cyclobenzaprine, fluticasone, glipizide, glucose blood, hydralazine,  hydroxyzine, ibuprofen, metformin, pregabalin, venlafaxine xr, verapamil, aspirin ec, and nitroglycerin.  Current Outpatient Prescriptions on File Prior to Visit  Medication Sig  . glipiZIDE (GLUCOTROL) 10 MG tablet Take 10 mg by mouth 2 (two) times daily before a meal.  . hydrALAZINE (APRESOLINE) 100 MG tablet Take 100 mg by mouth 3 (three) times daily.  . metFORMIN (GLUCOPHAGE) 500 MG tablet Take 500 mg by mouth 2 (two) times daily with a meal.  . verapamil (CALAN-SR) 240 MG CR tablet Take 240 mg by mouth 2 (two) times daily.   No current facility-administered medications on file prior to visit.    Imaging Review  Note: No new results found.        ROS  Cardiovascular History: High blood pressure Pulmonary or Respiratory History: Snoring  and Temporary stoppage of breathing during sleep Neurological History: Stroke (Residual deficits or weakness:  ) and Abnormal skin sensations (Peripheral Neuropathy) Review of Past Neurological Studies:  Results for orders placed or performed during the hospital encounter of 09/02/15  CT Head Wo Contrast   Narrative   CLINICAL DATA:  Frontal headache with blurred vision and left hand numbness; numbness of right side in its entirety.  EXAM: CT HEAD WITHOUT CONTRAST  TECHNIQUE: Contiguous axial images were obtained from the base of the skull through the vertex without intravenous contrast.  COMPARISON:  None.  FINDINGS: The ventricles are normal in size and configuration. There is apparent invagination of CSF into the sella. There is no intracranial mass hemorrhage, extra-axial fluid collection, or midline shift. Gray-white compartments are normal. No acute infarct identified.  Bony calvarium appears intact. There is extensive opacification of mastoid air cells bilaterally. There is lymphoid prominence in the posterior upper pharyngeal region.  IMPRESSION: Evidence of  a degree of empty sella. No intracranial mass, hemorrhage, or focal  gray -white compartment lesions/acute appearing infarct.  Extensive opacification of mastoid air cells bilaterally. Lymphoid prominence in the upper pharynx bilaterally of uncertain etiology.   Electronically Signed   By: Lowella Grip III M.D.   On: 09/02/2015 07:10    Psychological-Psychiatric History: Anxiousness, Depressed and Prone to panicking Gastrointestinal History: Reflux or heatburn Genitourinary History: No reported renal or genitourinary signs or symptoms such as difficulty voiding or producing urine, peeing blood, non-functioning kidney, kidney stones, difficulty emptying the bladder, difficulty controlling the flow of urine, or chronic kidney disease Hematological History: No reported hematological signs or symptoms such as prolonged bleeding, low or poor functioning platelets, bruising or bleeding easily, hereditary bleeding problems, low energy levels due to low hemoglobin or being anemic Endocrine History: High blood sugar controlled without the use of insulin (NIDDM) Rheumatologic History: Rheumatoid arthritis and Constant unexplained fatigue (Chronic Fatigue Syndrome) Musculoskeletal History: Negative for myasthenia gravis, muscular dystrophy, multiple sclerosis or malignant hyperthermia Work History: Disabled  Allergies  Ms. Hou is allergic to amlodipine; quinapril; hydrochlorothiazide; atenolol; and penicillins.  Laboratory Chemistry  Inflammation Markers No results found for: CRP, ESRSEDRATE (CRP: Acute Phase) (ESR: Chronic Phase) Renal Function Markers Lab Results  Component Value Date   BUN 13 09/20/2014   CREATININE 0.67 09/20/2014   GFRAA >60 09/20/2014   GFRNONAA >60 09/20/2014   Hepatic Function Markers Lab Results  Component Value Date   AST 38 (H) 10/23/2013   ALT 54 10/23/2013   ALBUMIN 3.2 (L) 10/23/2013   ALKPHOS 115 10/23/2013   Electrolytes Lab Results  Component Value Date   NA 137 09/20/2014   K 3.6 09/20/2014   CL 104  09/20/2014   CALCIUM 8.9 09/20/2014   Neuropathy Markers No results found for: [id number] Bone Pathology Markers Lab Results  Component Value Date   ALKPHOS 115 10/23/2013   CALCIUM 8.9 09/20/2014   Coagulation Parameters Lab Results  Component Value Date   INR 1.0 06/15/2014   LABPROT 12.8 06/15/2014   APTT 25.1 06/15/2014   PLT 281 09/20/2014   Cardiovascular Markers Lab Results  Component Value Date   BNP 16 06/15/2014   HGB 13.0 09/20/2014   HCT 39.9 09/20/2014   Note: Lab results reviewed.  [place]  Drug: Ms. Bambrick  reports that she does not use drugs. Alcohol:  reports that she does not drink alcohol. Tobacco:  reports that she has been smoking Cigarettes.  She has been smoking about 0.50 packs per day. She has never used smokeless tobacco. Medical:  has a past medical history of DDD (degenerative disc disease), lumbar; Diabetes mellitus without complication ([place]); Hyperlipidemia; Hypertension; MI (myocardial infarction) ([place]) (2015); and Stroke ([place]) (2017). Family: family history includes Cancer in her brother; Diabetes in her mother; Hypertension in her father and mother; Osteoarthritis in her father and mother; Stroke in her father.  History reviewed. No pertinent surgical history. Active Ambulatory Problems    Diagnosis Date Noted  . Abscess of breast, right 02/04/2015  . Basilar artery stenosis 05/19/2016  . Cervical spondylosis 12/30/2013  . Chronic pain 03/08/2014  . Coronary artery disease 05/28/2017  . Degenerative disc disease, lumbar 05/26/2016  . Encounter for long-term (current) use of other high-risk medications 05/10/2014  . Encounter for tobacco use cessation counseling 11/10/2016  . Essential hypertension 01/31/2016  . Gastroesophageal reflux disease 11/15/2015  . Ischemic stroke ([place]) 11/28/2015  . Lumbar facet arthropathy ([place]) 05/26/2016  . Lumbar spinal stenosis 05/26/2016  .  Lumbosacral radiculopathy 12/30/2013  . Major depressive  disorder with current active episode 06/13/2016  . Myofascial pain 02/06/2016  . Nicotine dependence, uncomplicated 45/85/9292  . Other chronic pain 03/08/2014  . Tobacco use disorder 11/29/2015  . Type 2 diabetes mellitus with complication, without long-term current use of insulin ([place]) 01/31/2016  . Visual field loss following stroke 05/19/2016  . Chronic neck pain (Primary Area of Pain) (L>R) 05/28/2017  . Chronic upper extremity pain  [facility] Area of Pain) (L>R) 05/28/2017  . Chronic thoracic back pain  (Secondary Area of Pain)(L>R) 05/28/2017  . Chronic low back pain (Fourth Area of Pain) (L>R) 05/28/2017  . Chronic pain syndrome 05/28/2017  . Long term current use of opiate analgesic 05/28/2017  . Long term prescription opiate use 05/28/2017  . Opiate use 05/28/2017  . Chronic sacroiliac joint pain 05/28/2017   Resolved Ambulatory Problems    Diagnosis Date Noted  . No Resolved Ambulatory Problems   Past Medical History:  Diagnosis Date  . DDD (degenerative disc disease), lumbar   . Diabetes mellitus without complication ([place])   . Hyperlipidemia   . Hypertension   . MI (myocardial infarction) ([place]) 2015  . Stroke [facility]) 2017   Constitutional Exam  General appearance: Well nourished, well developed, and well hydrated. In no apparent acute distress Vitals:   05/28/17 1135  BP: (!) 169/87  Pulse: 100  Resp: 16  Temp: 98 F (36.7 C)  TempSrc: Oral  SpO2: 99%  Weight: 255 lb 12.8 oz (116 kg)  Height: 5' 4"  (1.626 m)   BMI Assessment: Estimated body mass index is 43.91 kg/m as calculated from the following:   Height as of this encounter: 5' 4"  (1.626 m).   Weight as of this encounter: 255 lb 12.8 oz (116 kg). Psych/Mental status: Alert, oriented x 3 (person, place, & time)       Eyes: PERLA Respiratory: No evidence of acute respiratory distress  Cervical Spine Exam  Inspection: No masses, redness, or swelling Alignment: Symmetrical Functional ROM: Decreased ROM       Stability: No instability detected Muscle strength & Tone: Functionally intact Sensory: Unimpaired Palpation: Complains of area being tender to palpation              Upper Extremity (UE) Exam    Side: Right upper extremity  Side: Left upper extremity  Inspection: No masses, redness, swelling, or asymmetry. No contractures  Inspection: No masses, redness, swelling, or asymmetry. No contractures  Functional ROM: Unrestricted ROM          Functional ROM: Unrestricted ROM          Muscle strength & Tone: Functionally intact  Muscle strength & Tone: Functionally intact  Sensory: Unimpaired  Sensory: Unimpaired  Palpation: No palpable anomalies              Palpation: No palpable anomalies              Specialized Test(s): Deferred         Specialized Test(s): Deferred          Thoracic Spine Exam  Inspection: No masses, redness, or swelling Alignment: Symmetrical Functional ROM: Unrestricted ROM Stability: No instability detected Sensory: Unimpaired Muscle strength & Tone: No palpable anomalies  Lumbar Spine Exam  Inspection: No masses, redness, or swelling Alignment: Symmetrical Functional ROM: Unrestricted ROM      Stability: No instability detected Muscle strength & Tone: Functionally intact Sensory: Unimpaired Palpation: Complains of area being tender to palpation  Provocative Tests: Lumbar Hyperextension and rotation test: Positive bilaterally for facet joint pain. Patrick's Maneuver: Positive for bilateral S-I arthralgia              Gait & Posture Assessment  Ambulation: Unassisted Gait: Grossly intact Posture: WNL   Lower Extremity Exam    Side: Right lower extremity  Side: Left lower extremity  Inspection: No masses, redness, swelling, or asymmetry. No contractures  Inspection: No masses, redness, swelling, or asymmetry. No contractures  Functional ROM: Unrestricted ROM          Functional ROM: Unrestricted ROM          Muscle strength & Tone: Functionally  intact  Muscle strength & Tone: Functionally intact  Sensory: Unimpaired  Sensory: Unimpaired  Palpation: No palpable anomalies  Palpation: No palpable anomalies   Assessment  Primary Diagnosis & Pertinent Problem List: The primary encounter diagnosis was Chronic neck pain (Primary Area of Pain) (L>R). Diagnoses of Chronic pain of both upper extremities, Chronic bilateral thoracic back pain, Chronic bilateral low back pain, with sciatica presence unspecified, Chronic pain syndrome, Long term current use of opiate analgesic, and Chronic sacroiliac joint pain were also pertinent to this visit.  Visit Diagnosis: 1. Chronic neck pain (Primary Area of Pain) (L>R)   2. Chronic pain of both upper extremities   3. Chronic bilateral thoracic back pain   4. Chronic bilateral low back pain, with sciatica presence unspecified   5. Chronic pain syndrome   6. Long term current use of opiate analgesic   7. Chronic sacroiliac joint pain    Plan of Care  Initial treatment plan:  Please be advised that as per protocol, today's visit has been an evaluation only. We have not taken over the patient's controlled substance management.  Problem-specific plan: No problem-specific Assessment & Plan notes found for this encounter.  Ordered Lab-work, Procedure(s), Referral(s), & Consult(s): Orders Placed This Encounter  Procedures  . DG Lumbar Spine Complete W/Bend  . DG Si Joints  . Compliance Drug Analysis, Ur  . Comprehensive metabolic panel  . C-reactive protein  . Sedimentation rate  . Magnesium  . 25-Hydroxyvitamin D Lcms D2+D3  . Vitamin B12  . Ambulatory referral to Psychology   Pharmacotherapy: Medications ordered:  No orders of the defined types were placed in this encounter.  Medications administered during this visit: Ms. Flow had no medications administered during this visit.   Pharmacotherapy under consideration:  Opioid Analgesics: The patient was informed that there is no  guarantee that she would be a candidate for opioid analgesics. The decision will be made following CDC guidelines. This decision will be based on the results of diagnostic studies, as well as Ms. Buchberger risk profile.  Membrane stabilizer: To be determined at a later time Muscle relaxant: To be determined at a later time NSAID: To be determined at a later time Other analgesic(s): To be determined at a later time   Interventional therapies under consideration: Ms. Lovick was informed that there is no guarantee that she would be a candidate for interventional therapies. The decision will be based on the results of diagnostic studies, as well as Ms. Friis risk profile.  Possible procedure(s): diagnostic left sided cervical epidural steroid injection Diagnostic left-sided cervical facet block Left sided Cervical radiofrequency ablation Possible diagnostic left-sided thoracic facet block Possible diagnostic bilateral lumbar epidural steroid injection Possible diagnostic bilateral lumbar facet  Possible bilateral radiofrequency ablation   Provider-requested follow-up: Return for 2nd Visit, w/ Dr. Dossie Arbour, after [facility] eval.  No future appointments.  Primary Care Physician: Inge Rise, NP Location: [facility] Facility Note by:  Date: 05/28/2017; Time: 12:42 PM  Pain Score Disclaimer: We use the NRS-11 scale. This is a self-reported, subjective measurement of pain severity with only modest accuracy. It is used primarily to identify changes within a particular patient. It must be understood that outpatient pain scales are significantly less accurate that those used for research, where they can be applied under ideal controlled circumstances with minimal exposure to variables. In reality, the score is likely to be a combination of pain intensity and pain affect, where pain affect describes the degree of emotional arousal or changes in action readiness caused by the  sensory experience of pain. Factors such as social and work situation, setting, emotional state, anxiety levels, expectation, and prior pain experience may influence pain perception and show large inter-individual differences that may also be affected by time variables.  Patient instructions provided during this appointment: Patient Instructions    ____________________________________________________________________________________________  Appointment Policy Summary  It is our goal and responsibility to provide the medical community with assistance in the evaluation and management of patients with chronic pain. Unfortunately our resources are limited. Because we do not have an unlimited amount of time, or available appointments, we are required to closely monitor and manage their use. The following rules exist to maximize their use:  Patient's responsibilities: 1. Punctuality:  At what time should I arrive? You should be physically present in our office 30 minutes before your scheduled appointment. Your scheduled appointment is with your assigned healthcare provider. However, it takes 5-10 minutes to be "checked-in", and another 15 minutes for the nurses to do the admission. If you arrive to our office at the time you were given for your appointment, you will end up being at least 20-25 minutes late to your appointment with the provider. 2. Tardiness:  What happens if I arrive only a few minutes after my scheduled appointment time? You will need to reschedule your appointment. The cutoff is your appointment time. This is why it is so important that you arrive at least 30 minutes before that appointment. If you have an appointment scheduled for 10:00 AM and you arrive at 10:01, you will be required to reschedule your appointment.  3. Plan ahead:  Always assume that you will encounter traffic on your way in. Plan for it. If you are dependent on a driver, make sure they understand these rules and the  need to arrive early. 4. Other appointments and responsibilities:  Avoid scheduling any other appointments before or after your pain clinic appointments.  5. Be prepared:  Write down everything that you need to discuss with your healthcare provider and give this information to the admitting nurse. Write down the medications that you will need refilled. Bring your pills and bottles (even the empty ones), to all of your appointments, except for those where a procedure is scheduled. 6. No children or pets:  Find someone to take care of them. It is not appropriate to bring them in. 7. Scheduling changes:  We request "advanced notification" of any changes or cancellations. 8. Advanced notification:  Defined as a time period of more than 24 hours prior to the originally scheduled appointment. This allows for the appointment to be offered to other patients. 9. Rescheduling:  When a visit is rescheduled, it will require the cancellation of the original appointment. For this reason they both fall within the category of "Cancellations".  10.  Cancellations:  They require advanced notification. Any cancellation less than 24 hours before the  appointment will be recorded as a "No Show". 11. No Show:  Defined as an unkept appointment where the patient failed to notify or declare to the practice their intention or inability to keep the appointment.  Corrective process for repeat offenders:  1. Tardiness: Three (3) episodes of rescheduling due to late arrivals will be recorded as one (1) "No Show". 2. Cancellation or reschedule: Three (3) cancellations or rescheduling will be recorded as one (1) "No Show". 3. "No Shows": Three (3) "No Shows" within a 12 month period will result in discharge from the practice.  ____________________________________________________________________________________________  ____________________________________________________________________________________________  Pain  Scale  Introduction: The pain score used by this practice is the Verbal Numerical Rating Scale (VNRS-11). This is an 11-point scale. It is for adults and children 10 years or older. There are significant differences in how the pain score is reported, used, and applied. Forget everything you learned in the past and learn this scoring system.  General Information: The scale should reflect your current level of pain. Unless you are specifically asked for the level of your worst pain, or your average pain. If you are asked for one of these two, then it should be understood that it is over the past 24 hours.  Basic Activities of Daily Living (ADL): Personal hygiene, dressing, eating, transferring, and using restroom.  Instructions: Most patients tend to report their level of pain as a combination of two factors, their physical pain and their psychosocial pain. This last one is also known as "suffering" and it is reflection of how physical pain affects you socially and psychologically. From now on, report them separately. From this point on, when asked to report your pain level, report only your physical pain. Use the following table for reference.  Pain Clinic Pain Levels (0-5/10)  Pain Level Score  Description  No Pain 0   Mild pain 1 Nagging, annoying, but does not interfere with basic activities of daily living (ADL). Patients are able to eat, bathe, get dressed, toileting (being able to get on and off the toilet and perform personal hygiene functions), transfer (move in and out of bed or a chair without assistance), and maintain continence (able to control bladder and bowel functions). Blood pressure and heart rate are unaffected. A normal heart rate for a healthy adult ranges from 60 to 100 bpm (beats per minute).   Mild to moderate pain 2 Noticeable and distracting. Impossible to hide from other people. More frequent flare-ups. Still possible to adapt and function close to normal. It can be very  annoying and may have occasional stronger flare-ups. With discipline, patients may get used to it and adapt.   Moderate pain 3 Interferes significantly with activities of daily living (ADL). It becomes difficult to feed, bathe, get dressed, get on and off the toilet or to perform personal hygiene functions. Difficult to get in and out of bed or a chair without assistance. Very distracting. With effort, it can be ignored when deeply involved in activities.   Moderately severe pain 4 Impossible to ignore for more than a few minutes. With effort, patients may still be able to manage work or participate in some social activities. Very difficult to concentrate. Signs of autonomic nervous system discharge are evident: dilated pupils (mydriasis); mild sweating (diaphoresis); sleep interference. Heart rate becomes elevated (>115 bpm). Diastolic blood pressure (lower number) rises above 100 mmHg. Patients find relief in laying down  and not moving.   Severe pain 5 Intense and extremely unpleasant. Associated with frowning face and frequent crying. Pain overwhelms the senses.  Ability to do any activity or maintain social relationships becomes significantly limited. Conversation becomes difficult. Pacing back and forth is common, as getting into a comfortable position is nearly impossible. Pain wakes you up from deep sleep. Physical signs will be obvious: pupillary dilation; increased sweating; goosebumps; brisk reflexes; cold, clammy hands and feet; nausea, vomiting or dry heaves; loss of appetite; significant sleep disturbance with inability to fall asleep or to remain asleep. When persistent, significant weight loss is observed due to the complete loss of appetite and sleep deprivation.  Blood pressure and heart rate becomes significantly elevated. Caution: If elevated blood pressure triggers a pounding headache associated with blurred vision, then the patient should immediately seek attention at an urgent or  emergency care unit, as these may be signs of an impending stroke.    Emergency Department Pain Levels (6-10/10)  Emergency Room Pain 6 Severely limiting. Requires emergency care and should not be seen or managed at an outpatient pain management facility. Communication becomes difficult and requires great effort. Assistance to reach the emergency department may be required. Facial flushing and profuse sweating along with potentially dangerous increases in heart rate and blood pressure will be evident.   Distressing pain 7 Self-care is very difficult. Assistance is required to transport, or use restroom. Assistance to reach the emergency department will be required. Tasks requiring coordination, such as bathing and getting dressed become very difficult.   Disabling pain 8 Self-care is no longer possible. At this level, pain is disabling. The individual is unable to do even the most "basic" activities such as walking, eating, bathing, dressing, transferring to a bed, or toileting. Fine motor skills are lost. It is difficult to think clearly.   Incapacitating pain 9 Pain becomes incapacitating. Thought processing is no longer possible. Difficult to remember your own name. Control of movement and coordination are lost.   The worst pain imaginable 10 At this level, most patients pass out from pain. When this level is reached, collapse of the autonomic nervous system occurs, leading to a sudden drop in blood pressure and heart rate. This in turn results in a temporary and dramatic drop in blood flow to the brain, leading to a loss of consciousness. Fainting is one of the body's self defense mechanisms. Passing out puts the brain in a calmed state and causes it to shut down for a while, in order to begin the healing process.    Summary: 1. Refer to this scale when providing [place] with your pain level. 2. Be accurate and careful when reporting your pain level. This will help with your care. 3. Over-reporting  your pain level will lead to loss of credibility. 4. Even a level of 1/10 means that there is pain and will be treated at our facility. 5. High, inaccurate reporting will be documented as "Symptom Exaggeration", leading to loss of credibility and suspicions of possible secondary gains such as obtaining more narcotics, or wanting to appear disabled, for fraudulent reasons. 6. Only pain levels of 5 or below will be seen at our facility. 7. Pain levels of 6 and above will be sent to the Emergency Department and the appointment cancelled. ____________________________________________________________________________________________   BMI Assessment: Estimated body mass index is 43.91 kg/m as calculated from the following:   Height as of this encounter: 5' 4"  (1.626 m).   Weight as of this encounter:  255 lb 12.8 oz (116 kg).  BMI interpretation table: BMI level Category Range association with higher incidence of chronic pain  <18 kg/m2 Underweight   18.5-24.9 kg/m2 Ideal body weight   25-29.9 kg/m2 Overweight Increased incidence by 20%  30-34.9 kg/m2 Obese (Class I) Increased incidence by 68%  35-39.9 kg/m2 Severe obesity (Class II) Increased incidence by 136%  >40 kg/m2 Extreme obesity (Class III) Increased incidence by 254%   BMI Readings from Last 4 Encounters:  05/28/17 43.91 kg/m  10/28/15 49.02 kg/m  09/02/15 47.55 kg/m   Wt Readings from Last 4 Encounters:  05/28/17 255 lb 12.8 oz (116 kg)  10/28/15 268 lb (121.6 kg)  09/02/15 260 lb (117.9 kg)

## 2017-05-28 NOTE — Progress Notes (Signed)
Safety precautions to be maintained throughout the outpatient stay will include: orient to surroundings, keep bed in low position, maintain call bell within reach at all times, provide assistance with transfer out of bed and ambulation.  

## 2017-06-02 LAB — COMPLIANCE DRUG ANALYSIS, UR

## 2017-06-03 LAB — COMPREHENSIVE METABOLIC PANEL
ALBUMIN: 4.2 g/dL (ref 3.5–5.5)
ALK PHOS: 107 IU/L (ref 39–117)
ALT: 21 IU/L (ref 0–32)
AST: 25 IU/L (ref 0–40)
Albumin/Globulin Ratio: 1.4 (ref 1.2–2.2)
BUN / CREAT RATIO: 23 (ref 9–23)
BUN: 14 mg/dL (ref 6–24)
Bilirubin Total: 0.5 mg/dL (ref 0.0–1.2)
CO2: 23 mmol/L (ref 20–29)
CREATININE: 0.6 mg/dL (ref 0.57–1.00)
Calcium: 10 mg/dL (ref 8.7–10.2)
Chloride: 107 mmol/L — ABNORMAL HIGH (ref 96–106)
GFR calc Af Amer: 126 mL/min/{1.73_m2} (ref >59)
GFR, EST NON AFRICAN AMERICAN: 109 mL/min/{1.73_m2} (ref >59)
GLOBULIN, TOTAL: 3 g/dL (ref 1.5–4.5)
Glucose: 89 mg/dL (ref 65–99)
Potassium: 4.6 mmol/L (ref 3.5–5.2)
SODIUM: 144 mmol/L (ref 134–144)
Total Protein: 7.2 g/dL (ref 6.0–8.5)

## 2017-06-03 LAB — 25-HYDROXYVITAMIN D LCMS D2+D3: 25-HYDROXY, VITAMIN D-3: 13 ng/mL

## 2017-06-03 LAB — SEDIMENTATION RATE: Sed Rate: 2 mm/hr (ref 0–32)

## 2017-06-03 LAB — VITAMIN B12: Vitamin B-12: 700 pg/mL (ref 232–1245)

## 2017-06-03 LAB — MAGNESIUM: Magnesium: 1.8 mg/dL (ref 1.6–2.3)

## 2017-06-03 LAB — C-REACTIVE PROTEIN: CRP: 0.4 mg/L (ref 0.0–4.9)

## 2017-06-03 LAB — 25-HYDROXY VITAMIN D LCMS D2+D3: 25-Hydroxy, Vitamin D: 14 ng/mL — ABNORMAL LOW

## 2017-06-04 ENCOUNTER — Other Ambulatory Visit: Payer: Self-pay | Admitting: Nurse Practitioner

## 2017-06-04 ENCOUNTER — Encounter: Payer: Self-pay | Admitting: Nurse Practitioner

## 2017-06-04 DIAGNOSIS — E559 Vitamin D deficiency, unspecified: Secondary | ICD-10-CM | POA: Insufficient documentation

## 2017-06-04 MED ORDER — ERGOCALCIFEROL 1.25 MG (50000 UT) PO CAPS: 50000.0000 [IU] | ORAL_CAPSULE | ORAL | 0 refills | Status: AC

## 2017-06-08 ENCOUNTER — Telehealth: Payer: Self-pay | Admitting: *Deleted

## 2017-06-08 NOTE — Telephone Encounter (Signed)
Patient advised of Vitamin D level.

## 2017-06-15 ENCOUNTER — Ambulatory Visit
Admission: RE | Admit: 2017-06-15 | Discharge: 2017-06-15 | Disposition: A | Discharge status: Home / Self Care | Payer: Medicaid Other | Source: Ambulatory Visit | Attending: Nurse Practitioner | Admitting: Nurse Practitioner

## 2017-06-15 DIAGNOSIS — M533 Sacrococcygeal disorders, not elsewhere classified: Secondary | ICD-10-CM

## 2017-06-15 DIAGNOSIS — G8929 Other chronic pain: Secondary | ICD-10-CM | POA: Insufficient documentation

## 2017-06-15 DIAGNOSIS — M545 Low back pain: Principal | ICD-10-CM

## 2017-06-15 DIAGNOSIS — M47816 Spondylosis without myelopathy or radiculopathy, lumbar region: Secondary | ICD-10-CM | POA: Diagnosis not present

## 2017-06-16 NOTE — Progress Notes (Signed)
Results were reviewed and found to be: mildly abnormal  No acute injury or pathology identified  Review would suggest interventional pain management techniques may be of benefit 

## 2017-06-21 ENCOUNTER — Encounter: Payer: Self-pay | Admitting: Pain Medicine

## 2017-06-21 NOTE — Progress Notes (Addendum)
Patient's Name: Morgan Patel  MRN: [id number]  Referring Provider: Burnette, Erin C, NP  DOB: 12/22/1970  PCP: Burnette, Erin C, NP  DOS: 06/22/2017  Note by: Francisco A Naveira, MD  Service setting: Ambulatory outpatient  Specialty: Interventional Pain Management  Location: [facility] (AMB) Pain Management Facility    Patient type: Established   Primary Reason(s) for Visit: Encounter for evaluation before starting new chronic pain management plan of care (Level of risk: moderate) CC: Neck Pain and Back Pain (low)  HPI  Morgan Patel is a 46 y.o. year old, female patient, who comes today for a follow-up evaluation to review the test results and decide on a treatment plan. She has Basilar artery stenosis; Coronary artery disease; DDD (degenerative disc disease), lumbar; Encounter for long-term (current) use of other high-risk medications; Encounter for tobacco use cessation counseling; Essential hypertension; Gastroesophageal reflux disease; Ischemic stroke (HCC); Lumbar facet arthropathy (Bilateral); Lumbosacral radiculopathy; Major depressive disorder with current active episode; Chronic myofascial pain; Nicotine dependence, uncomplicated; Other chronic pain; Tobacco use disorder; Type 2 diabetes mellitus with complication, without long-term current use of insulin (HCC); Visual field loss following stroke; Chronic neck pain (Primary Area of Pain) (Bilateral) (L>R); Chronic low back pain (Second Area of Pain) (Bilateral) (L>R); Chronic pain syndrome; Chronic sacroiliac joint pain (Bilateral) (L>R); Vitamin D deficiency; DDD (degenerative disc disease), cervical; Disorder of skeletal system; Pharmacologic therapy; Problems influencing health status; Chronic thoracic back pain (Bilateral) (L>R); Chronic upper extremity pain (4) (Bilateral) (L>R); Lumbar facet syndrome (Bilateral) (L>R); Cervical spondylosis; Lumbar central spinal stenosis; Chronic pain of lower extremity (3)(B)(L>R); and Social fears on her  problem list. Her primarily concern today is the Neck Pain and Back Pain (low)  Pain Assessment: Location: Upper, Left Neck Radiating: left shoulder and arm Onset: More than a month ago Duration: Chronic pain Quality: Cramping, Constant, Nagging (pulling) Severity: 10-Worst pain ever (neck is a 10, low back 6)/10 (self-reported pain score)  Note: Reported level is inconsistent with clinical observations. Clinically the patient looks like a 3/10 Information on the proper use of the pain scale provided to the patient today. When using our objective Pain Scale, levels between 6 and 10/10 are said to belong in an emergency room, as it progressively worsens from a 6/10, described as severely limiting, requiring emergency care not usually available at an outpatient pain management facility. At a 6/10 level, communication becomes difficult and requires great effort. Assistance to reach the emergency department may be required. Facial flushing and profuse sweating along with potentially dangerous increases in heart rate and blood pressure will be evident. Timing: Constant Modifying factors: rest, pain patches, NSAIDS, heat  Morgan Patel comes in today for a follow-up visit after her initial evaluation on 06/08/2017. Today we went over the results of her tests. These were explained in "Layman's terms". During today's appointment we went over my diagnostic impression, as well as the proposed treatment plan.  According to the patient her primary area of pain is in her neck. She admits that the left side is greater than the right. She radicular pains that go into her shoulder. She denies any previous surgery, interventional therapy or physical therapy.   Her second area of pain is in her upper back. She feels that this also radiates down into her shoulders. She denies any previous surgeries, interventional therapy or physical therapy  Her third area of pain is in her upper extremities. She denies any numbness  tingling or weakness. She denies any previous surgeries, interventional therapy   or physical therapy.  Her next area of pain is in her lower back. She admits the left is greater than the right. She does have occasional radicular symptoms. She denies any numbness or tingling. She describes aching pain with some weakness. She admits that she does have occasional falls contribute to loss of balance. She did have PT for balance and safety in the past.   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 She admits that she has had multiple strokes the last year She also suffered an MI about 3-4 years ago.  As the nurse was conducting her initial interview, the patient expressed her concern about her safety at home, apparently related to her daughter's boyfriend. This type of things always concerning when it comes to making a decision as to whether or not we can provide this patient with any kind of controlled substances.  In considering the treatment plan options, Morgan Patel was reminded that I no longer take patients for medication management only. I asked her to let me know if she had no intention of taking advantage of the interventional therapies, so that we could make arrangements to provide this space to someone interested. I also made it clear that undergoing interventional therapies for the purpose of getting pain medications is very inappropriate on the part of a patient, and it will not be tolerated in this practice. This type of behavior would suggest true addiction and therefore it requires referral to an addiction specialist.   Further details on both, my assessment(s), as well as the proposed treatment plan, please see below.  Controlled Substance Pharmacotherapy Assessment REMS (Risk Evaluation and Mitigation Strategy)  Analgesic: Tramadol 50 mg 1 tablet by mouth every 6 hours prn for pain (The patient had initially indicated that she was not taking any pain medication. However, when we looked at the  results of the UDS, it turned out that she was taking hydrocodone. Patient indicates that that was the last pill that she had and that she has not taking anything else since. During the course of our conversation, she indicated that that hydrocodone was left from a prior prescription given to her. She also indicated that she saves the pills for a "rainy day". Of course, this is a type of hoarding of medications. Today I asked her to collect all the pills that she had at home and bring him here to be destroyed. This is when she said that she had no more left. Since she was not taking any medications, and according to her had not been taking any for a while, I decided to start the patient on tramadol 50 mg 1 tablet by mouth every 6 hours. When she received a prescription from the nurse, she said that this was like taking water and that she didn't want the prescription. The nurse proceeded to threat the prescription, after which the patient returned and indicated that she would take to prescription. Unfortunately for the patient, by now the prescription had been destroyed. In view of this I decided that it would not be proper to give her another prescription. I will concentrate managing her pain primarily with interventional therapies.) Highest recorded MME/day: 90 mg/day MME/day: 20 mg/day Pill Count: None expected due to no prior prescriptions written by our practice. Landis Martins, RN  06/22/2017  1:09 PM  Sign at close encounter Attempted to explain Medication Agreement, she said she was ready to go and didn't need to hear it. When given Tramadol, states. "I  don't want that, it's like taking Aspirin." The prescription was placed in the shredder box. A few minutes later, she returned and stated she changed her mind and wanted to try it.Dr. Dossie Arbour informed, instructed patient that the prescription will not be reprinted.  Hart Rochester, RN  06/22/2017 11:48 AM  Sign at close encounter Safety  precautions to be maintained throughout the outpatient stay will include: orient to surroundings, keep bed in low position, maintain call bell within reach at all times, provide assistance with transfer out of bed and ambulation.    Pharmacokinetics: Liberation and absorption (onset of action): WNL Distribution (time to peak effect): WNL Metabolism and excretion (duration of action): WNL         Pharmacodynamics: Desired effects: Analgesia: Ms. Forge reports >50% benefit. Functional ability: Patient reports that medication allows her to accomplish basic ADLs Clinically meaningful improvement in function (CMIF): Sustained CMIF goals met Perceived effectiveness: Described as relatively effective, allowing for increase in activities of daily living (ADL) Undesirable effects: Side-effects or Adverse reactions: None reported Monitoring: [place] PMP: Online review of the past 51-monthperiod previously conducted. Not applicable at this point since we have not taken over the patient's medication management yet. List of all Serum Drug Screening Test(s):  No results found for: AMPHSCRSER, BARBSCRSER, BENZOSCRSER, COCAINSCRSER, PCPSCRSER, THCSCRSER, OPIATESCRSER, [place] test(s) done:  Lab Results  Component Value Date   SUMMARY FINAL 05/28/2017   Last UDS on record: Summary  Date Value Ref Range Status  05/28/2017 FINAL  Final    Comment:    ==================================================================== TOXASSURE COMP DRUG ANALYSIS,UR ==================================================================== Test                             Result       Flag       Units Drug Present and Declared for Prescription Verification   Pregabalin                     PRESENT      EXPECTED   Cyclobenzaprine                PRESENT      EXPECTED   Desmethylcyclobenzaprine       PRESENT      EXPECTED    Desmethylcyclobenzaprine is an expected metabolite of     cyclobenzaprine.   Venlafaxine                    PRESENT      EXPECTED   Desmethylvenlafaxine           PRESENT      EXPECTED    Desmethylvenlafaxine is an expected metabolite of venlafaxine.   Hydroxyzine                    PRESENT      EXPECTED   Verapamil                      PRESENT      EXPECTED Drug Present not Declared for Prescription Verification   Hydrocodone                    114          UNEXPECTED ng/mg creat   Norhydrocodone                 100  UNEXPECTED ng/mg creat    Sources of hydrocodone include scheduled prescription    medications. Norhydrocodone is an expected metabolite of    hydrocodone.   Acetaminophen                  PRESENT      UNEXPECTED   Ibuprofen                      PRESENT      UNEXPECTED Drug Absent but Declared for Prescription Verification   Salicylate                     Not Detected UNEXPECTED    Aspirin, as indicated in the declared medication list, is not    always detected even when used as directed. ==================================================================== Test                      Result    Flag   Units      Ref Range   Creatinine              265              mg/dL      >=20 ==================================================================== Declared Medications:  The flagging and interpretation on this report are based on the  following declared medications.  Unexpected results may arise from  inaccuracies in the declared medications.  **Note: The testing scope of this panel includes these medications:  Cyclobenzaprine (Flexeril)  Hydroxyzine  Pregabalin  Venlafaxine  Verapamil  **Note: The testing scope of this panel does not include small to  moderate amounts of these reported medications:  Aspirin (Aspirin 81)  **Note: The testing scope of this panel does not include following  reported medications:  Atorvastatin (Lipitor)  Fluticasone (Flonase)  Glipizide (Glucotrol)  Hydralazine  Metformin   Nitroglycerin ==================================================================== For clinical consultation, please call [phone]. ====================================================================    UDS interpretation: Unexpected findings: Today I reminded Ms. Suttles that accuracy during medication reconciliation is of paramount importance. When I informed the patient that hydrocodone had been found on the UDS, she immediately went into the defensive, which is something that I have noticed is common when there is the possibility of substance use disorder. Medication Assessment Form: Patient introduced to form today Treatment compliance: Treatment may start today if patient agrees with proposed plan. Evaluation of compliance is not applicable at this point Risk Assessment Profile: Aberrant behavior: See initial evaluations. None observed or detected today Comorbid factors increasing risk of overdose: See initial evaluation. No additional risks detected today Medical Psychology Evaluation: Please see scanned results in medical record.     Opioid Risk Tool - 05/28/17 1156      Family History of Substance Abuse   Alcohol Negative   Illegal Drugs Negative   Rx Drugs Negative     Personal History of Substance Abuse   Alcohol Negative   Illegal Drugs Negative   Rx Drugs Negative     Psychological Disease   Psychological Disease Positive   ADD Negative   OCD Negative   Bipolar Negative   Schizophrenia Negative   Depression Positive  patient takes effexor for depression but remains to have a problem over family situation.     Total Score   Opioid Risk Tool Scoring 3   Opioid Risk Interpretation Low Risk     ORT Scoring interpretation table:  Score <3 = Low Risk for SUD  Score between 4-7 =  Moderate Risk for SUD  Score >8 = High Risk for Opioid Abuse   Risk Mitigation Strategies:  Patient opioid safety counseling: Completed today. Counseling provided to patient as per  "Patient Counseling Document". Document signed by patient, attesting to counseling and understanding Patient-Prescriber Agreement (PPA): The patient indicated that she was in a hurry and she did not want to complete the agreement. Because of this, I have also not provide her with any controlled substances.  Controlled substance notification to other providers: Not applicable  Pharmacologic Plan: For now, I will manage the patient's condition without the use of opioids as I am somewhat concerned about the information provided to me by the patient.             Laboratory Chemistry  Inflammation Markers (CRP: Acute Phase) (ESR: Chronic Phase) Lab Results  Component Value Date   CRP 0.4 05/28/2017   ESRSEDRATE 2 05/28/2017                 Renal Function Markers Lab Results  Component Value Date   BUN 14 05/28/2017   CREATININE 0.60 05/28/2017   GFRAA 126 05/28/2017   GFRNONAA 109 05/28/2017                 Hepatic Function Markers Lab Results  Component Value Date   AST 25 05/28/2017   ALT 21 05/28/2017   ALBUMIN 4.2 05/28/2017   ALKPHOS 107 05/28/2017                 Electrolytes Lab Results  Component Value Date   NA 144 05/28/2017   K 4.6 05/28/2017   CL 107 (H) 05/28/2017   CALCIUM 10.0 05/28/2017   MG 1.8 05/28/2017                 Neuropathy Markers Lab Results  Component Value Date   VITAMINB12 700 05/28/2017                 Bone Pathology Markers Lab Results  Component Value Date   ALKPHOS 107 05/28/2017   25OHVITD1 14 (L) 05/28/2017   25OHVITD2 <1.0 05/28/2017   25OHVITD3 13 05/28/2017   CALCIUM 10.0 05/28/2017                 Coagulation Parameters Lab Results  Component Value Date   INR 1.0 06/15/2014   LABPROT 12.8 06/15/2014   APTT 25.1 06/15/2014   PLT 281 09/20/2014                 Cardiovascular Markers Lab Results  Component Value Date   BNP 16 06/15/2014   HGB 13.0 09/20/2014   HCT 39.9 09/20/2014                 Note: Lab  results reviewed and explained to patient in Layman's terms.  Recent Diagnostic Imaging Review  Lumbosacral Imaging: Lumbar DG Bending views:  Results for orders placed during the hospital encounter of 06/15/17  DG Lumbar Spine Complete W/Bend   Narrative CLINICAL DATA:  Mid to lower back pain for years.  EXAM: LUMBAR SPINE - COMPLETE WITH BENDING VIEWS  COMPARISON:  None.  FINDINGS: No fracture or malalignment. Degenerative disc disease seen at L4-5 and L5-S1. No unexpected change in alignment with flexion or extension.  IMPRESSION: Degenerative changes as above. No instability with flexion or extension identified.   Electronically Signed   By: David  Williams III M.D   On: 06/15/2017 17:04    Sacroiliac Joint Imaging:   Sacroiliac Joint DG:  Results for orders placed during the hospital encounter of 06/15/17  DG Si Joints   Narrative CLINICAL DATA:  Mid low back pain for years.  EXAM: BILATERAL SACROILIAC JOINTS - 3+ VIEW  COMPARISON:  None.  FINDINGS: SI joints are unremarkable. Degenerative changes are seen in the lower lumbar spine.  IMPRESSION: Negative.   Electronically Signed   By: Misty Stanley M.D.   On: 06/15/2017 19:59    Note: Results of ordered imaging test(s) reviewed and explained to patient in Layman's terms. Copy of results provided to patient.  Meds   Current Outpatient Prescriptions:  .  aspirin EC 81 MG tablet, Take 81 mg by mouth daily., Disp: , Rfl:  .  atorvastatin (LIPITOR) 80 MG tablet, Take 80 mg by mouth daily., Disp: , Rfl:  .  cyclobenzaprine (FLEXERIL) 10 MG tablet, Take 10 mg by mouth at bedtime as needed., Disp: , Rfl:  .  ergocalciferol (VITAMIN D2) 50000 units capsule, Take 1 capsule (50,000 Units total) by mouth once a week. X 12 weeks., Disp: 12 capsule, Rfl: 0 .  fluticasone (FLONASE) 50 MCG/ACT nasal spray, Place 1 spray into both nostrils daily., Disp: , Rfl:  .  glipiZIDE (GLUCOTROL) 10 MG tablet, Take 10 mg by  mouth 2 (two) times daily before a meal., Disp: , Rfl:  .  glucose blood (FREESTYLE TEST STRIPS) test strip, 1 strip as needed., Disp: , Rfl:  .  hydrALAZINE (APRESOLINE) 100 MG tablet, Take 100 mg by mouth 3 (three) times daily., Disp: , Rfl:  .  hydrOXYzine (ATARAX/VISTARIL) 25 MG tablet, Take 1 tablet by mouth daily., Disp: , Rfl:  .  ibuprofen (ADVIL,MOTRIN) 600 MG tablet, Take 600 mg by mouth daily., Disp: , Rfl:  .  metFORMIN (GLUCOPHAGE) 500 MG tablet, Take 500 mg by mouth 2 (two) times daily with a meal., Disp: , Rfl:  .  nitroGLYCERIN (NITROSTAT) 0.4 MG SL tablet, Place 0.4 mg under the tongue as needed., Disp: , Rfl:  .  pregabalin (LYRICA) 150 MG capsule, Take 150 mg by mouth 2 (two) times daily., Disp: , Rfl:  .  venlafaxine XR (EFFEXOR-XR) 37.5 MG 24 hr capsule, Take 2 capsules by mouth daily., Disp: , Rfl:  .  verapamil (CALAN-SR) 240 MG CR tablet, Take 240 mg by mouth 2 (two) times daily., Disp: , Rfl:  .  traMADol (ULTRAM) 50 MG tablet, Take 1 tablet (50 mg total) by mouth every 6 (six) hours as needed for severe pain., Disp: 120 tablet, Rfl: 0  ROS  Constitutional: Denies any fever or chills Gastrointestinal: No reported hemesis, hematochezia, vomiting, or acute GI distress Musculoskeletal: Denies any acute onset joint swelling, redness, loss of ROM, or weakness Neurological: No reported episodes of acute onset apraxia, aphasia, dysarthria, agnosia, amnesia, paralysis, loss of coordination, or loss of consciousness  Allergies  Ms. Bamburg is allergic to amlodipine; quinapril; hydrochlorothiazide; atenolol; and penicillins.  [place]  Drug: Ms. Guiles  reports that she does not use drugs. Alcohol:  reports that she does not drink alcohol. Tobacco:  reports that she has been smoking Cigarettes.  She has been smoking about 0.50 packs per day. She has never used smokeless tobacco. Medical:  has a past medical history of Abscess of breast, right (02/04/2015); DDD (degenerative disc  disease), lumbar; Diabetes mellitus without complication ([place]); Hyperlipidemia; Hypertension; MI (myocardial infarction) ([place]) (2015); and Stroke ([place]) (2017). Surgical: Ms. Sunderland  has no past surgical history on file. Family: family history includes Cancer  in her brother; Diabetes in her mother; Hypertension in her father and mother; Osteoarthritis in her father and mother; Stroke in her father.  Constitutional Exam  General appearance: Well nourished, well developed, and well hydrated. In no apparent acute distress Vitals:   06/22/17 1129  BP: (!) 162/100  Pulse: (!) 104  Resp: 18  Temp: 98.4 F (36.9 C)  TempSrc: Oral  SpO2: 98%  Weight: 259 lb 9.6 oz (117.8 kg)  Height: 5' 2" (1.575 m)   BMI Assessment: Estimated body mass index is 47.48 kg/m as calculated from the following:   Height as of this encounter: 5' 2" (1.575 m).   Weight as of this encounter: 259 lb 9.6 oz (117.8 kg).  BMI interpretation table: BMI level Category Range association with higher incidence of chronic pain  <18 kg/m2 Underweight   18.5-24.9 kg/m2 Ideal body weight   25-29.9 kg/m2 Overweight Increased incidence by 20%  30-34.9 kg/m2 Obese (Class I) Increased incidence by 68%  35-39.9 kg/m2 Severe obesity (Class II) Increased incidence by 136%  >40 kg/m2 Extreme obesity (Class III) Increased incidence by 254%   BMI Readings from Last 4 Encounters:  06/22/17 47.48 kg/m  05/28/17 43.91 kg/m  10/28/15 49.02 kg/m  09/02/15 47.55 kg/m   Wt Readings from Last 4 Encounters:  06/22/17 259 lb 9.6 oz (117.8 kg)  05/28/17 255 lb 12.8 oz (116 kg)  10/28/15 268 lb (121.6 kg)  09/02/15 260 lb (117.9 kg)  Psych/Mental status: Alert, oriented x 3 (person, place, & time)       Eyes: PERLA Respiratory: No evidence of acute respiratory distress  Cervical Spine Area Exam  Skin & Axial Inspection: No masses, redness, edema, swelling, or associated skin lesions Alignment: Symmetrical Functional ROM:  Decreased ROM      Stability: No instability detected Muscle Tone/Strength: Functionally intact. No obvious neuro-muscular anomalies detected. Sensory (Neurological): Movement-associated pain Palpation: Complains of area being tender to palpation              Upper Extremity (UE) Exam    Side: Right upper extremity  Side: Left upper extremity  Skin & Extremity Inspection: Skin color, temperature, and hair growth are WNL. No peripheral edema or cyanosis. No masses, redness, swelling, asymmetry, or associated skin lesions. No contractures.  Skin & Extremity Inspection: Skin color, temperature, and hair growth are WNL. No peripheral edema or cyanosis. No masses, redness, swelling, asymmetry, or associated skin lesions. No contractures.  Functional ROM: Unrestricted ROM          Functional ROM: Unrestricted ROM          Muscle Tone/Strength: Functionally intact. No obvious neuro-muscular anomalies detected.  Muscle Tone/Strength: Functionally intact. No obvious neuro-muscular anomalies detected.  Sensory (Neurological): Unimpaired          Sensory (Neurological): Unimpaired          Palpation: No palpable anomalies              Palpation: No palpable anomalies              Specialized Test(s): Deferred         Specialized Test(s): Deferred          Thoracic Spine Area Exam  Skin & Axial Inspection: No masses, redness, or swelling Alignment: Symmetrical Functional ROM: Unrestricted ROM Stability: No instability detected Muscle Tone/Strength: Functionally intact. No obvious neuro-muscular anomalies detected. Sensory (Neurological): Unimpaired Muscle strength & Tone: No palpable anomalies  Lumbar Spine Area Exam  Skin & Axial Inspection:   No masses, redness, or swelling Alignment: Symmetrical Functional ROM: Decreased ROM      Stability: No instability detected Muscle Tone/Strength: Functionally intact. No obvious neuro-muscular anomalies detected. Sensory (Neurological): Movement-associated  pain Palpation: Complains of area being tender to palpation       Provocative Tests: Lumbar Hyperextension and rotation test: Positive bilaterally for facet joint pain. Lumbar Lateral bending test: evaluation deferred today       Patrick's Maneuver: evaluation deferred today                    Gait & Posture Assessment  Ambulation: Unassisted Gait: Relatively normal for age and body habitus Posture: WNL   Lower Extremity Exam    Side: Right lower extremity  Side: Left lower extremity  Skin & Extremity Inspection: Skin color, temperature, and hair growth are WNL. No peripheral edema or cyanosis. No masses, redness, swelling, asymmetry, or associated skin lesions. No contractures.  Skin & Extremity Inspection: Skin color, temperature, and hair growth are WNL. No peripheral edema or cyanosis. No masses, redness, swelling, asymmetry, or associated skin lesions. No contractures.  Functional ROM: Unrestricted ROM          Functional ROM: Unrestricted ROM          Muscle Tone/Strength: Functionally intact. No obvious neuro-muscular anomalies detected.  Muscle Tone/Strength: Functionally intact. No obvious neuro-muscular anomalies detected.  Sensory (Neurological): Unimpaired  Sensory (Neurological): Unimpaired  Palpation: No palpable anomalies  Palpation: No palpable anomalies   Assessment & Plan  Primary Diagnosis & Pertinent Problem List: The primary encounter diagnosis was Chronic neck pain (Primary Area of Pain) (Bilateral) (L>R). Diagnoses of Cervical spondylosis, DDD (degenerative disc disease), cervical, Chronic thoracic back pain (Secondary Area of Pain) (Bilateral) (L>R), Chronic upper extremity pain (Tertiary Area of Pain) (Bilateral) (L>R), Chronic low back pain (Fourth Area of Pain) (Bilateral) (L>R), DDD (degenerative disc disease), lumbar, Lumbar central spinal stenosis, Lumbar facet arthropathy (Bilateral), Lumbar facet syndrome (Bilateral) (L>R), Chronic sacroiliac joint pain  (Bilateral) (L>R), Lumbosacral radiculopathy, Chronic myofascial pain, Chronic pain syndrome, Other chronic pain, Disorder of skeletal system, Problems influencing health status, Pharmacologic therapy, Chronic pain of lower extremity (3)(B)(L>R), and Social fears were also pertinent to this visit.  Visit Diagnosis: 1. Chronic neck pain (Primary Area of Pain) (Bilateral) (L>R)   2. Cervical spondylosis   3. DDD (degenerative disc disease), cervical   4. Chronic thoracic back pain (Secondary Area of Pain) (Bilateral) (L>R)   5. Chronic upper extremity pain (Tertiary Area of Pain) (Bilateral) (L>R)   6. Chronic low back pain (Fourth Area of Pain) (Bilateral) (L>R)   7. DDD (degenerative disc disease), lumbar   8. Lumbar central spinal stenosis   9. Lumbar facet arthropathy (Bilateral)   10. Lumbar facet syndrome (Bilateral) (L>R)   11. Chronic sacroiliac joint pain (Bilateral) (L>R)   12. Lumbosacral radiculopathy   13. Chronic myofascial pain   14. Chronic pain syndrome   15. Other chronic pain   16. Disorder of skeletal system   17. Problems influencing health status   18. Pharmacologic therapy   19. Chronic pain of lower extremity (3)(B)(L>R)   20. Social fears    Problems updated and reviewed during this visit: Problem  Ddd (Degenerative Disc Disease), Cervical  Chronic thoracic back pain (Bilateral) (L>R)  Chronic upper extremity pain (4) (Bilateral) (L>R)  Lumbar facet syndrome (Bilateral) (L>R)  Cervical spondylosis  Lumbar central spinal stenosis  Chronic pain of lower extremity (3)(B)(L>R)  Chronic neck pain (Primary Area of   Pain) (Bilateral) (L>R)  Chronic low back pain (Second Area of Pain) (Bilateral) (L>R)  Chronic Pain Syndrome  Chronic sacroiliac joint pain (Bilateral) (L>R)  Ddd (Degenerative Disc Disease), Lumbar  Lumbar facet arthropathy (Bilateral)  Chronic Myofascial Pain  Other Chronic Pain  Disorder of Skeletal System  Pharmacologic Therapy  Problems  Influencing Health Status  Social Fears    Plan of Care  Pharmacotherapy (Medications Ordered): Meds ordered this encounter  Medications  . traMADol (ULTRAM) 50 MG tablet    Sig: Take 1 tablet (50 mg total) by mouth every 6 (six) hours as needed for severe pain.    Dispense:  120 tablet    Refill:  0    Do not place this medication, or any other prescription from our practice, on "Automatic Refill". Patient may have prescription filled one day early if pharmacy is closed on scheduled refill date. Do not fill until: 06/22/17 To last until: 07/22/17 Note: The patient actually did not take this prescription home. Please read above.     Procedure Orders     Cervical Epidural Injection Lab Orders  No laboratory test(s) ordered today   Imaging Orders  No imaging studies ordered today   Referral Orders  No referral(s) requested today    Pharmacological management options:  Opioid Analgesics: We'll take over management today. See above orders Membrane stabilizer: We have discussed the possibility of optimizing this mode of therapy, if tolerated Muscle relaxant: We have discussed the possibility of a trial NSAID: We have discussed the possibility of a trial Other analgesic(s): To be determined at a later time   Interventional management options: Planned, scheduled, and/or pending:    Diagnostic left CESI under fluoro    Considering:   Diagnostic left sided cervical epidural steroid injection Diagnostic left-sided cervical facet block Diagnostic Left sided Cervical radiofrequency ablation Diagnostic left-sided thoracic facet block Diagnostic bilateral lumbar epidural steroid injection Diagnostic bilateral lumbar facet  Possible bilateral radiofrequency ablation   PRN Procedures:   None at this time   Provider-requested follow-up: Return for Procedure (w/ sedation): (L) CESI.  Future Appointments Date Time Provider Department Center  06/30/2017 10:15 AM Naveira,  Francisco, MD [facility] None    Primary Care Physician: Burnette, Erin C, NP Location: [facility] Note by: Francisco A Naveira, MD Date: 06/22/2017; Time: 2:12 PM 

## 2017-06-22 ENCOUNTER — Ambulatory Visit: Payer: Medicaid Other | Attending: Pain Medicine | Admitting: Pain Medicine

## 2017-06-22 ENCOUNTER — Encounter: Payer: Self-pay | Admitting: Pain Medicine

## 2017-06-22 VITALS — BP 162/100 | HR 104 | Temp 98.4°F | Resp 18 | Ht 62.0 in | Wt 259.6 lb

## 2017-06-22 DIAGNOSIS — M5030 Other cervical disc degeneration, unspecified cervical region: Secondary | ICD-10-CM | POA: Insufficient documentation

## 2017-06-22 DIAGNOSIS — M48061 Spinal stenosis, lumbar region without neurogenic claudication: Secondary | ICD-10-CM | POA: Diagnosis not present

## 2017-06-22 DIAGNOSIS — G8929 Other chronic pain: Secondary | ICD-10-CM | POA: Insufficient documentation

## 2017-06-22 DIAGNOSIS — M4722 Other spondylosis with radiculopathy, cervical region: Secondary | ICD-10-CM | POA: Diagnosis not present

## 2017-06-22 DIAGNOSIS — Z880 Allergy status to penicillin: Secondary | ICD-10-CM | POA: Diagnosis not present

## 2017-06-22 DIAGNOSIS — I10 Essential (primary) hypertension: Secondary | ICD-10-CM | POA: Insufficient documentation

## 2017-06-22 DIAGNOSIS — M546 Pain in thoracic spine: Secondary | ICD-10-CM | POA: Diagnosis not present

## 2017-06-22 DIAGNOSIS — M79602 Pain in left arm: Secondary | ICD-10-CM

## 2017-06-22 DIAGNOSIS — M5441 Lumbago with sciatica, right side: Secondary | ICD-10-CM

## 2017-06-22 DIAGNOSIS — I252 Old myocardial infarction: Secondary | ICD-10-CM | POA: Insufficient documentation

## 2017-06-22 DIAGNOSIS — E119 Type 2 diabetes mellitus without complications: Secondary | ICD-10-CM | POA: Insufficient documentation

## 2017-06-22 DIAGNOSIS — F4010 Social phobia, unspecified: Secondary | ICD-10-CM | POA: Insufficient documentation

## 2017-06-22 DIAGNOSIS — Z79899 Other long term (current) drug therapy: Secondary | ICD-10-CM | POA: Insufficient documentation

## 2017-06-22 DIAGNOSIS — M79601 Pain in right arm: Secondary | ICD-10-CM | POA: Diagnosis not present

## 2017-06-22 DIAGNOSIS — Z7982 Long term (current) use of aspirin: Secondary | ICD-10-CM | POA: Diagnosis not present

## 2017-06-22 DIAGNOSIS — M545 Low back pain: Secondary | ICD-10-CM | POA: Insufficient documentation

## 2017-06-22 DIAGNOSIS — F17210 Nicotine dependence, cigarettes, uncomplicated: Secondary | ICD-10-CM | POA: Diagnosis not present

## 2017-06-22 DIAGNOSIS — M533 Sacrococcygeal disorders, not elsewhere classified: Secondary | ICD-10-CM | POA: Insufficient documentation

## 2017-06-22 DIAGNOSIS — F329 Major depressive disorder, single episode, unspecified: Secondary | ICD-10-CM | POA: Insufficient documentation

## 2017-06-22 DIAGNOSIS — Z8673 Personal history of transient ischemic attack (TIA), and cerebral infarction without residual deficits: Secondary | ICD-10-CM | POA: Diagnosis not present

## 2017-06-22 DIAGNOSIS — M899 Disorder of bone, unspecified: Secondary | ICD-10-CM | POA: Insufficient documentation

## 2017-06-22 DIAGNOSIS — Z7984 Long term (current) use of oral hypoglycemic drugs: Secondary | ICD-10-CM | POA: Insufficient documentation

## 2017-06-22 DIAGNOSIS — M47812 Spondylosis without myelopathy or radiculopathy, cervical region: Secondary | ICD-10-CM | POA: Diagnosis not present

## 2017-06-22 DIAGNOSIS — E785 Hyperlipidemia, unspecified: Secondary | ICD-10-CM | POA: Diagnosis not present

## 2017-06-22 DIAGNOSIS — M5417 Radiculopathy, lumbosacral region: Secondary | ICD-10-CM

## 2017-06-22 DIAGNOSIS — M48062 Spinal stenosis, lumbar region with neurogenic claudication: Secondary | ICD-10-CM | POA: Insufficient documentation

## 2017-06-22 DIAGNOSIS — G894 Chronic pain syndrome: Secondary | ICD-10-CM | POA: Insufficient documentation

## 2017-06-22 DIAGNOSIS — M79605 Pain in left leg: Secondary | ICD-10-CM

## 2017-06-22 DIAGNOSIS — M47816 Spondylosis without myelopathy or radiculopathy, lumbar region: Secondary | ICD-10-CM | POA: Insufficient documentation

## 2017-06-22 DIAGNOSIS — M5442 Lumbago with sciatica, left side: Secondary | ICD-10-CM | POA: Diagnosis not present

## 2017-06-22 DIAGNOSIS — M961 Postlaminectomy syndrome, not elsewhere classified: Secondary | ICD-10-CM | POA: Diagnosis not present

## 2017-06-22 DIAGNOSIS — Z789 Other specified health status: Secondary | ICD-10-CM | POA: Insufficient documentation

## 2017-06-22 DIAGNOSIS — M542 Cervicalgia: Secondary | ICD-10-CM

## 2017-06-22 DIAGNOSIS — I2510 Atherosclerotic heart disease of native coronary artery without angina pectoris: Secondary | ICD-10-CM | POA: Insufficient documentation

## 2017-06-22 DIAGNOSIS — M7918 Myalgia, other site: Secondary | ICD-10-CM | POA: Diagnosis not present

## 2017-06-22 DIAGNOSIS — M5136 Other intervertebral disc degeneration, lumbar region: Secondary | ICD-10-CM | POA: Insufficient documentation

## 2017-06-22 DIAGNOSIS — Z888 Allergy status to other drugs, medicaments and biological substances status: Secondary | ICD-10-CM | POA: Diagnosis not present

## 2017-06-22 DIAGNOSIS — I651 Occlusion and stenosis of basilar artery: Secondary | ICD-10-CM | POA: Insufficient documentation

## 2017-06-22 DIAGNOSIS — M79604 Pain in right leg: Secondary | ICD-10-CM

## 2017-06-22 MED ORDER — TRAMADOL HCL 50 MG PO TABS: 50.0000 mg | ORAL_TABLET | Freq: Four times a day (QID) | ORAL | 0 refills | Status: DC | PRN

## 2017-06-22 NOTE — Progress Notes (Signed)
Safety precautions to be maintained throughout the outpatient stay will include: orient to surroundings, keep bed in low position, maintain call bell within reach at all times, provide assistance with transfer out of bed and ambulation.  

## 2017-06-22 NOTE — Progress Notes (Signed)
Attempted to explain Medication Agreement, she said she was ready to go and didn't need to hear it. When given Tramadol, states. "I don't want that, it's like taking Aspirin." The prescription was placed in the shredder box. A few minutes later, she returned and stated she changed her mind and wanted to try it.Dr. Laban Emperor informed, instructed patient that the prescription will not be reprinted.

## 2017-06-22 NOTE — Patient Instructions (Addendum)
____________________________________________________________________________________________  Pain Scale  Introduction: The pain score used by this practice is the Verbal Numerical Rating Scale (VNRS-11). This is an 11-point scale. It is for adults and children 10 years or older. There are significant differences in how the pain score is reported, used, and applied. Forget everything you learned in the past and learn this scoring system.  General Information: The scale should reflect your current level of pain. Unless you are specifically asked for the level of your worst pain, or your average pain. If you are asked for one of these two, then it should be understood that it is over the past 24 hours.  Basic Activities of Daily Living (ADL): Personal hygiene, dressing, eating, transferring, and using restroom.  Instructions: Most patients tend to report their level of pain as a combination of two factors, their physical pain and their psychosocial pain. This last one is also known as "suffering" and it is reflection of how physical pain affects you socially and psychologically. From now on, report them separately. From this point on, when asked to report your pain level, report only your physical pain. Use the following table for reference.  Pain Clinic Pain Levels (0-5/10)  Pain Level Score  Description  No Pain 0   Mild pain 1 Nagging, annoying, but does not interfere with basic activities of daily living (ADL). Patients are able to eat, bathe, get dressed, toileting (being able to get on and off the toilet and perform personal hygiene functions), transfer (move in and out of bed or a chair without assistance), and maintain continence (able to control bladder and bowel functions). Blood pressure and heart rate are unaffected. A normal heart rate for a healthy adult ranges from 60 to 100 bpm (beats per minute).   Mild to moderate pain 2 Noticeable and distracting. Impossible to hide from other  people. More frequent flare-ups. Still possible to adapt and function close to normal. It can be very annoying and may have occasional stronger flare-ups. With discipline, patients may get used to it and adapt.   Moderate pain 3 Interferes significantly with activities of daily living (ADL). It becomes difficult to feed, bathe, get dressed, get on and off the toilet or to perform personal hygiene functions. Difficult to get in and out of bed or a chair without assistance. Very distracting. With effort, it can be ignored when deeply involved in activities.   Moderately severe pain 4 Impossible to ignore for more than a few minutes. With effort, patients may still be able to manage work or participate in some social activities. Very difficult to concentrate. Signs of autonomic nervous system discharge are evident: dilated pupils (mydriasis); mild sweating (diaphoresis); sleep interference. Heart rate becomes elevated (>115 bpm). Diastolic blood pressure (lower number) rises above 100 mmHg. Patients find relief in laying down and not moving.   Severe pain 5 Intense and extremely unpleasant. Associated with frowning face and frequent crying. Pain overwhelms the senses.  Ability to do any activity or maintain social relationships becomes significantly limited. Conversation becomes difficult. Pacing back and forth is common, as getting into a comfortable position is nearly impossible. Pain wakes you up from deep sleep. Physical signs will be obvious: pupillary dilation; increased sweating; goosebumps; brisk reflexes; cold, clammy hands and feet; nausea, vomiting or dry heaves; loss of appetite; significant sleep disturbance with inability to fall asleep or to remain asleep. When persistent, significant weight loss is observed due to the complete loss of appetite and sleep deprivation.  Blood   pressure and heart rate becomes significantly elevated. Caution: If elevated blood pressure triggers a pounding headache  associated with blurred vision, then the patient should immediately seek attention at an urgent or emergency care unit, as these may be signs of an impending stroke.    Emergency Department Pain Levels (6-10/10)  Emergency Room Pain 6 Severely limiting. Requires emergency care and should not be seen or managed at an outpatient pain management facility. Communication becomes difficult and requires great effort. Assistance to reach the emergency department may be required. Facial flushing and profuse sweating along with potentially dangerous increases in heart rate and blood pressure will be evident.   Distressing pain 7 Self-care is very difficult. Assistance is required to transport, or use restroom. Assistance to reach the emergency department will be required. Tasks requiring coordination, such as bathing and getting dressed become very difficult.   Disabling pain 8 Self-care is no longer possible. At this level, pain is disabling. The individual is unable to do even the most "basic" activities such as walking, eating, bathing, dressing, transferring to a bed, or toileting. Fine motor skills are lost. It is difficult to think clearly.   Incapacitating pain 9 Pain becomes incapacitating. Thought processing is no longer possible. Difficult to remember your own name. Control of movement and coordination are lost.   The worst pain imaginable 10 At this level, most patients pass out from pain. When this level is reached, collapse of the autonomic nervous system occurs, leading to a sudden drop in blood pressure and heart rate. This in turn results in a temporary and dramatic drop in blood flow to the brain, leading to a loss of consciousness. Fainting is one of the body's self defense mechanisms. Passing out puts the brain in a calmed state and causes it to shut down for a while, in order to begin the healing process.    Summary: 1. Refer to this scale when providing us with your pain level. 2. Be  accurate and careful when reporting your pain level. This will help with your care. 3. Over-reporting your pain level will lead to loss of credibility. 4. Even a level of 1/10 means that there is pain and will be treated at our facility. 5. High, inaccurate reporting will be documented as "Symptom Exaggeration", leading to loss of credibility and suspicions of possible secondary gains such as obtaining more narcotics, or wanting to appear disabled, for fraudulent reasons. 6. Only pain levels of 5 or below will be seen at our facility. 7. Pain levels of 6 and above will be sent to the Emergency Department and the appointment cancelled. ____________________________________________________________________________________________  ____________________________________________________________________________________________  Preparing for Procedure with Sedation Instructions: . Oral Intake: Do not eat or drink anything for at least 8 hours prior to your procedure. . Transportation: Public transportation is not allowed. Bring an adult driver. The driver must be physically present in our waiting room before any procedure can be started. . Physical Assistance: Bring an adult physically capable of assisting you, in the event you need help. This adult should keep you company at home for at least 6 hours after the procedure. . Blood Pressure Medicine: Take your blood pressure medicine with a sip of water the morning of the procedure. . Blood thinners:  . Diabetics on insulin: Notify the staff so that you can be scheduled 1st case in the morning. If your diabetes requires high dose insulin, take only  of your normal insulin dose the morning of the procedure and notify the   staff that you have done so. . Preventing infections: Shower with an antibacterial soap the morning of your procedure. . Build-up your immune system: Take 1000 mg of Vitamin C with every meal (3 times a day) the day prior to your  procedure. [place] Kitchen Antibiotics: Inform the staff if you have a condition or reason that requires you to take antibiotics before dental procedures. . Pregnancy: If you are pregnant, call and cancel the procedure. . Sickness: If you have a cold, fever, or any active infections, call and cancel the procedure. . Arrival: You must be in the facility at least 30 minutes prior to your scheduled procedure. . Children: Do not bring children with you. . Dress appropriately: Bring dark clothing that you would not mind if they get stained. . Valuables: Do not bring any jewelry or valuables. Procedure appointments are reserved for interventional treatments only. Marland Kitchen No Prescription Refills. . No medication changes will be discussed during procedure appointments. . No disability issues will be discussed. ____________________________________________________________________________________________   ____________________________________________________________________________________________  Medication Rules  Applies to: All patients receiving prescriptions (written or electronic).  Pharmacy of record: Pharmacy where electronic prescriptions will be sent. If written prescriptions are taken to a different pharmacy, please inform the nursing staff. The pharmacy listed in the electronic medical record should be the one where you would like electronic prescriptions to be sent.  Prescription refills: Only during scheduled appointments. Applies to both, written and electronic prescriptions.  NOTE: The following applies primarily to controlled substances (Opioid* Pain Medications).   Patient's responsibilities: 1. Pain Pills: Bring all pain pills to every appointment (except for procedure appointments). 2. Pill Bottles: Bring pills in original pharmacy bottle. Always bring newest bottle. Bring bottle, even if empty. 3. Medication refills: You are responsible for knowing and keeping track of what medications you need  refilled. The day before your appointment, write a list of all prescriptions that need to be refilled. Bring that list to your appointment and give it to the admitting nurse. Prescriptions will be written only during appointments. If you forget a medication, it will not be "Called in", "Faxed", or "electronically sent". You will need to get another appointment to get these prescribed. 4. Prescription Accuracy: You are responsible for carefully inspecting your prescriptions before leaving our office. Have the discharge nurse carefully go over each prescription with you, before taking them home. Make sure that your name is accurately spelled, that your address is correct. Check the name and dose of your medication to make sure it is accurate. Check the number of pills, and the written instructions to make sure they are clear and accurate. Make sure that you are given enough medication to last until your next medication refill appointment. 5. Taking Medication: Take medication as prescribed. Never take more pills than instructed. Never take medication more frequently than prescribed. Taking less pills or less frequently is permitted and encouraged, when it comes to controlled substances (written prescriptions).  6. Inform other Doctors: Always inform, all of your healthcare providers, of all the medications you take. 7. Pain Medication from other Providers: You are not allowed to accept any additional pain medication from any other Doctor or Healthcare provider. There are two exceptions to this rule. (see below) In the event that you require additional pain medication, you are responsible for notifying us, as stated below. 8. Medication Agreement: You are responsible for carefully reading and following our Medication Agreement. This must be signed before receiving any prescriptions from our practice. Safely store a  copy of your signed Agreement. Violations to the Agreement will result in no further prescriptions.  (Additional copies of our Medication Agreement are available upon request.) 9. Laws, Rules, & Regulations: All patients are expected to follow all [address] and [employer], Rules, [place] [employer]. Ignorance of the Laws does not constitute a valid excuse. The use of any illegal substances is prohibited. 10. Adopted CDC guidelines & recommendations: Target dosing levels will be at or below 60 MME/day. Use of benzodiazepines** is not recommended.  Exceptions: There are only two exceptions to the rule of not receiving pain medications from other Healthcare Providers. 1. Exception #1 (Emergencies): In the event of an emergency (i.e.: accident requiring emergency care), you are allowed to receive additional pain medication. However, you are responsible for: As soon as you are able, call our office [phone], at any time of the day or night, and leave a message stating your name, the date and nature of the emergency, and the name and dose of the medication prescribed. In the event that your call is answered by a member of our staff, make sure to document and save the date, time, and the name of the person that took your information.  2. Exception #2 (Planned Surgery): In the event that you are scheduled by another doctor or dentist to have any type of surgery or procedure, you are allowed (for a period no longer than 30 days), to receive additional pain medication, for the acute post-op pain. However, in this case, you are responsible for picking up a copy of our "Post-op Pain Management for Surgeons" handout, and giving it to your surgeon or dentist. This document is available at our office, and does not require an appointment to obtain it. Simply go to our office during business hours (Monday-Thursday from 8:00 AM to 4:00 PM) (Friday 8:00 AM to 12:00 Noon) or if you have a scheduled appointment with [place], prior to your surgery, and ask for it by name. In addition, you will need to provide [place] with your  name, name of your surgeon, type of surgery, and date of procedure or surgery.  *Opioid medications include: morphine, codeine, oxycodone, oxymorphone, hydrocodone, hydromorphone, meperidine, tramadol, tapentadol, buprenorphine, fentanyl, methadone. **Benzodiazepine medications include: diazepam (Valium), alprazolam (Xanax), clonazepam (Klonopine), lorazepam (Ativan), clorazepate (Tranxene), chlordiazepoxide (Librium), estazolam (Prosom), oxazepam (Serax), temazepam (Restoril), triazolam (Halcion)  ____________________________________________________________________________________________ Epidural Steroid Injection Patient Information  Description: The epidural space surrounds the nerves as they exit the spinal cord.  In some patients, the nerves can be compressed and inflamed by a bulging disc or a tight spinal canal (spinal stenosis).  By injecting steroids into the epidural space, we can bring irritated nerves into direct contact with a potentially helpful medication.  These steroids act directly on the irritated nerves and can reduce swelling and inflammation which often leads to decreased pain.  Epidural steroids may be injected anywhere along the spine and from the neck to the low back depending upon the location of your pain.   After numbing the skin with local anesthetic (like Novocaine), a small needle is passed into the epidural space slowly.  You may experience a sensation of pressure while this is being done.  The entire block usually last less than 10 minutes.  Conditions which may be treated by epidural steroids:   Low back and leg pain  Neck and arm pain  Spinal stenosis  Post-laminectomy syndrome  Herpes zoster (shingles) pain  Pain from compression fractures  Preparation for the injection:  3. Do not eat any solid food or dairy products within 8 hours of your appointment.  4. You may drink clear liquids up to 3 hours before appointment.  Clear liquids include water,  black coffee, juice or soda.  No milk or cream please. 5. You may take your regular medication, including pain medications, with a sip of water before your appointment  Diabetics should hold regular insulin (if taken separately) and take 1/2 normal NPH dos the morning of the procedure.  Carry some sugar containing items with you to your appointment. 6. A driver must accompany you and be prepared to drive you home after your procedure.  7. Bring all your current medications with your. 8. An IV may be inserted and sedation may be given at the discretion of the physician.   9. A blood pressure cuff, EKG and other monitors will often be applied during the procedure.  Some patients may need to have extra oxygen administered for a short period. 10. You will be asked to provide medical information, including your allergies, prior to the procedure.  We must know immediately if you are taking blood thinners (like Coumadin/Warfarin)  Or if you are allergic to IV iodine contrast (dye). We must know if you could possible be pregnant.  Possible side-effects:  Bleeding from needle site  Infection (rare, may require surgery)  Nerve injury (rare)  Numbness & tingling (temporary)  Difficulty urinating (rare, temporary)  Spinal headache ( a headache worse with upright posture)  Light -headedness (temporary)  Pain at injection site (several days)  Decreased blood pressure (temporary)  Weakness in arm/leg (temporary)  Pressure sensation in back/neck (temporary)  Call if you experience:  Fever/chills associated with headache or increased back/neck pain.  Headache worsened by an upright position.  New onset weakness or numbness of an extremity below the injection site  Hives or difficulty breathing (go to the emergency room)  Inflammation or drainage at the infection site  Severe back/neck pain  Any new symptoms which are concerning to you  Please note:  Although the local anesthetic  injected can often make your back or neck feel good for several hours after the injection, the pain will likely return.  It takes 3-7 days for steroids to work in the epidural space.  You may not notice any pain relief for at least that one week.  If effective, we will often do a series of three injections spaced 3-6 weeks apart to maximally decrease your pain.  After the initial series, we generally will wait several months before considering a repeat injection of the same type.  If you have any questions, please call [phone] [facility] Clinic[facility]estic Violence Information What is domestic violence? Domestic violence, also called intimate partner violence, can involve physical, emotional, psychological, sexual, and economic abuse by a current or former intimate partner. Stalking is also considered a type of domestic violence. Domestic violence can happen between people who are or were:  Married.  Dating.  Living together.  Abusers repeatedly act to maintain control and power over their partner. Physical abuse Physical abuse can include:  Slapping.  Hitting.  Kicking.  Punching.  Choking.  Pulling the victim's hair.  Damaging the victim's property.  Threatening or hurting the victim with weapons.  Trapping the victim in his or her home.  Forcing the victim to use drugs or alcohol.  Emotional and psychological abuse Emotional and psychological abuse can include:  Threats.  Insults.  Isolation.  Humiliation.  Jealousy and possessiveness.  Blame.  Withholding affection.  Intimidation.  Manipulation.  Limiting contact with friends and family.  Sexual abuse Sexual abuse can include:  Forcing sex.  Forcing sexual touching.  Hurting the victim during sex.  Forcing the victim to have sex with other people.  Giving the victim a sexually transmitted disease (STD) on purpose.  Economic abuse Economic abuse can  include:  Controlling resources, such as money, food, transportation, a phone, or computer.  Stealing money from the victim or his or her family or friends.  Forbidding the victim to work.  Refusing to work or to contribute to the household.  Stalking Stalking can include:  Making repeated, unwanted phone calls, emails, or text messages.  Leaving cards, letters, flowers or other items the victim does not want.  Watching or following the victim from a distance.  Going to places where the victim does not want the abuser.  Entering the victim's home or car.  Damaging the victim's personal property.  What are some warning signs of domestic violence? Physical signs  Bruises.  Broken bones.  Burns or cuts.  Physical pain.  Head injury. Emotional and psychological signs  Crying.  Depression.  Hopelessness.  Desperation.  Trouble sleeping.  Fear of partner.  Anxiety.  Suicidal behavior.  Antisocial behavior.  Low self-esteem.  Fear of intimacy.  Flashbacks. Sexual signs  Bruising, swelling, or bleeding of the genital or rectal area.  Signs of a sexually transmitted infection, such as genital sores, warts, or discharge coming from the genital area.  Pain in the genital area.  Unintended pregnancy.  Problems with pregnancy, including delayed prenatal care and prematurity. Economic signs  Having little money or food.  Homelessness.  Asking for or borrowing money. What are common behaviors of those affected by domestic violence? Those affected by domestic violence may:  Be late to work or other events.  Not show up to places as promised.  Have to let their partner know where they are and who they are with.  Be isolated or kept from seeing friends or family.  Make comments about their partner's temper or behavior.  Make excuses for their partner.  Engage in high-risk sexual behaviors.  Use drugs or alcohol.  Have unhealthy  diet-related behaviors.  What are common feelings of those affected by domestic violence? Those affected by domestic violence may feel that:  They have to be careful not to say or do things that trigger their partner's anger.  They cannot do anything right.  They deserve to be treated badly.  They are overreacting to their partner's behavior or temper.  They cannot trust their own feelings.  They cannot trust other people.  They are trapped.  Their partner would take away their children.  They are emotionally drained or numb.  Their life is in danger.  They might have to kill their partner to survive.  Where can you get help? Domestic violence hotlines and websites If you do not feel safe searching for help online at home, use a computer at [employer] to access [employer]. Call 911 if you are in immediate danger or need medical help.  [employer]. ? The 24-hour phone hotline is 306-814-3085 or 423-247-8581 (TTY). ? The videophone is available Monday through Friday, 9 a.m. to 5 p.m. Call 214-528-3065. ? The website is http://thehotline.org  The National Sexual Assault Hotline. ? The 24-hour phone hotline is 478-350-5810. ? You can access the online hotline at [employer]nl  Shelters for victims of domestic violence If you are a victim of domestic violence, there are resources to help you find a temporary place for you and your children to live (shelter). The specific address of these shelters is often not known to the public. Police Report assaults, threats, and stalking to the police. Counselors and counseling centers People who have been victims of domestic violence can benefit from counseling. Counseling can help you cope with difficult emotions and empower you to plan for your future safety. The topics you discuss with a counselor are private and confidential. Children of domestic violence victims also might  need counseling to manage stress and anxiety. The court system You can work with a Clinical research associate or an advocate to get legal protection against an abuser. Protection includes restraining orders and private addresses. Crimes against you, such as assault, can also be prosecuted through the courts. Laws vary by state. This information is not intended to replace advice given to you by your health care provider. Make sure you discuss any questions you have with your health care provider. Document Released: 11/29/2003 Document Revised: 08/22/2016 Document Reviewed: 05/26/2014 Elsevier Interactive Patient Education  2018 [employer].

## 2017-06-30 ENCOUNTER — Ambulatory Visit: Payer: Medicaid Other | Admitting: Pain Medicine

## 2017-06-30 DIAGNOSIS — M5412 Radiculopathy, cervical region: Secondary | ICD-10-CM

## 2017-06-30 DIAGNOSIS — G8929 Other chronic pain: Secondary | ICD-10-CM | POA: Insufficient documentation

## 2017-06-30 NOTE — Progress Notes (Deleted)
Patient's Name: Morgan Patel  MRN: [id number]  Referring Provider: Steve Rattler, NP  DOB: [birth year]  PCP: Steve Rattler, NP  DOS: 06/30/2017  Note by: Oswaldo Done, MD  Service setting: Ambulatory outpatient  Specialty: Interventional Pain Management  Patient type: Established  Location: [facility] (AMB) Pain Management Facility  Visit type: Interventional Procedure   Primary Reason for Visit: Interventional Pain Management Treatment. CC: No chief complaint on file.  Procedure:  Anesthesia, Analgesia, Anxiolysis:  Type: Diagnostic, Inter-Laminar, Epidural Steroid Injection Region: Posterior Cervico-thoracic Region Level: C7-T1 Laterality: Left-Sided Paramedial  Type: Local Anesthesia with Moderate (Conscious) Sedation Local Anesthetic: Lidocaine 1% Route: Intravenous (IV) IV Access: Secured Sedation: Meaningful verbal contact was maintained at all times during the procedure  Indication(s): Analgesia and Anxiety   Indications: 1. Chronic upper extremity pain (Fourth Area of Pain) (Bilateral) (L>R)   2. Chronic cervical radicular pain (Bilateral)   3. Cervical spondylosis   4. Chronic neck pain (Primary Area of Pain) (Bilateral) (L>R)   5. DDD (degenerative disc disease), cervical    Pain Score: Pre-procedure:  /10 Post-procedure:  /10  Pre-op Assessment:  Morgan Patel is a 46 y.o. (year old), female patient, seen today for interventional treatment. She  has no past surgical history on file. Morgan Patel has a current medication list which includes the following prescription(s): aspirin ec, atorvastatin, cyclobenzaprine, ergocalciferol, fluticasone, glipizide, glucose blood, hydralazine, hydroxyzine, ibuprofen, metformin, nitroglycerin, pregabalin, tramadol, venlafaxine xr, and verapamil. Her primarily concern today is the No chief complaint on file.  Initial Vital Signs: There were no vitals taken for this visit. BMI: Estimated body mass index is 47.48 kg/m as calculated  from the following:   Height as of 06/22/17: 5\' 2"  (1.575 m).   Weight as of 06/22/17: 259 lb 9.6 oz (117.8 kg).  Risk Assessment: Allergies: Reviewed. She is allergic to amlodipine; quinapril; hydrochlorothiazide; atenolol; and penicillins.  Allergy Precautions: None required Coagulopathies: Reviewed. None identified.  Blood-thinner therapy: None at this time Active Infection(s): Reviewed. None identified. Morgan Patel is afebrile  Site Confirmation: Morgan Patel was asked to confirm the procedure and laterality before marking the site Procedure checklist: Completed Consent: Before the procedure and under the influence of no sedative(s), amnesic(s), or anxiolytics, the patient was informed of the treatment options, risks and possible complications. To fulfill our ethical and legal obligations, as recommended by the American Medical Association's Code of Ethics, I have informed the patient of my clinical impression; the nature and purpose of the treatment or procedure; the risks, benefits, and possible complications of the intervention; the alternatives, including doing nothing; the risk(s) and benefit(s) of the alternative treatment(s) or procedure(s); and the risk(s) and benefit(s) of doing nothing. The patient was provided information about the general risks and possible complications associated with the procedure. These may include, but are not limited to: failure to achieve desired goals, infection, bleeding, organ or nerve damage, allergic reactions, paralysis, and death. In addition, the patient was informed of those risks and complications associated to Spine-related procedures, such as failure to decrease pain; infection (i.e.: Meningitis, epidural or intraspinal abscess); bleeding (i.e.: epidural hematoma, subarachnoid hemorrhage, or any other type of intraspinal or peri-dural bleeding); organ or nerve damage (i.e.: Any type of peripheral nerve, nerve root, or spinal cord injury) with subsequent  damage to sensory, motor, and/or autonomic systems, resulting in permanent pain, numbness, and/or weakness of one or several areas of the body; allergic reactions; (i.e.: anaphylactic reaction); and/or death. Furthermore, the patient was informed of  those risks and complications associated with the medications. These include, but are not limited to: allergic reactions (i.e.: anaphylactic or anaphylactoid reaction(s)); adrenal axis suppression; blood sugar elevation that in diabetics may result in ketoacidosis or comma; water retention that in patients with history of congestive heart failure may result in shortness of breath, pulmonary edema, and decompensation with resultant heart failure; weight gain; swelling or edema; medication-induced neural toxicity; particulate matter embolism and blood vessel occlusion with resultant organ, and/or nervous system infarction; and/or aseptic necrosis of one or more joints. Finally, the patient was informed that Medicine is not an exact science; therefore, there is also the possibility of unforeseen or unpredictable risks and/or possible complications that may result in a catastrophic outcome. The patient indicated having understood very clearly. We have given the patient no guarantees and we have made no promises. Enough time was given to the patient to ask questions, all of which were answered to the patient's satisfaction. Morgan Patel has indicated that she wanted to continue with the procedure. Attestation: I, the ordering provider, attest that I have discussed with the patient the benefits, risks, side-effects, alternatives, likelihood of achieving goals, and potential problems during recovery for the procedure that I have provided informed consent. Date: 06/30/2017; Time: 7:51 AM  Pre-Procedure Preparation:  Monitoring: As per clinic protocol. Respiration, ETCO2, SpO2, BP, heart rate and rhythm monitor placed and checked for adequate function Safety Precautions:  Patient was assessed for positional comfort and pressure points before starting the procedure. Time-out: I initiated and conducted the "Time-out" before starting the procedure, as per protocol. The patient was asked to participate by confirming the accuracy of the "Time Out" information. Verification of the correct person, site, and procedure were performed and confirmed by me, the nursing staff, and the patient. "Time-out" conducted as per Joint Commission's Universal Protocol (UP.01.01.01). "Time-out" Date & Time: 06/30/2017;   hrs.  Description of Procedure Process:   Position: Prone with head of the table was raised to facilitate breathing. Target Area: For Epidural Steroid injections the target is the interlaminar space, initially targeting the lower border of the superior vertebral body lamina. Approach: Paramedial approach. Area Prepped: Entire PosteriorCervical Region Prepping solution: ChloraPrep (2% chlorhexidine gluconate and 70% isopropyl alcohol) Safety Precautions: Aspiration looking for blood return was conducted prior to all injections. At no point did we inject any substances, as a needle was being advanced. No attempts were made at seeking any paresthesias. Safe injection practices and needle disposal techniques used. Medications properly checked for expiration dates. SDV (single dose vial) medications used. Description of the Procedure: Protocol guidelines were followed. The procedure needle was introduced through the skin, ipsilateral to the reported pain, and advanced to the target area. Bone was contacted and the needle walked caudad, until the lamina was cleared. The epidural space was identified using "loss-of-resistance technique" with 2-3 ml of PF-NaCl (0.9% NSS), in a 5cc LOR glass syringe. There were no vitals filed for this visit.  Start Time:   hrs. End Time:   hrs. Materials:  Needle(s) Type: Epidural needle Gauge: 17G Length: 3.5-in Medication(s): Ms. Blakeney had no  medications administered during this visit. Please see chart orders for dosing details.  Imaging Guidance (Spinal):  Type of Imaging Technique: Fluoroscopy Guidance (Spinal) Indication(s): Assistance in needle guidance and placement for procedures requiring needle placement in or near specific anatomical locations not easily accessible without such assistance. Exposure Time: Please see nurses notes. Contrast: Before injecting any contrast, we confirmed that the patient did not  have an allergy to iodine, shellfish, or radiological contrast. Once satisfactory needle placement was completed at the desired level, radiological contrast was injected. Contrast injected under live fluoroscopy. No contrast complications. See chart for type and volume of contrast used. Fluoroscopic Guidance: I was personally present during the use of fluoroscopy. "Tunnel Vision Technique" used to obtain the best possible view of the target area. Parallax error corrected before commencing the procedure. "Direction-depth-direction" technique used to introduce the needle under continuous pulsed fluoroscopy. Once target was reached, antero-posterior, oblique, and lateral fluoroscopic projection used confirm needle placement in all planes. Images permanently stored in EMR. Interpretation: I personally interpreted the imaging intraoperatively. Adequate needle placement confirmed in multiple planes. Appropriate spread of contrast into desired area was observed. No evidence of afferent or efferent intravascular uptake. No intrathecal or subarachnoid spread observed. Permanent images saved into the patient's record.  Antibiotic Prophylaxis:  Indication(s): None identified Antibiotic given: None  Post-operative Assessment:  EBL: None Complications: No immediate post-treatment complications observed by team, or reported by patient. Note: The patient tolerated the entire procedure well. A repeat set of vitals were taken after the  procedure and the patient was kept under observation following institutional policy, for this type of procedure. Post-procedural neurological assessment was performed, showing return to baseline, prior to discharge. The patient was provided with post-procedure discharge instructions, including a section on how to identify potential problems. Should any problems arise concerning this procedure, the patient was given instructions to immediately contact us, at any time, without hesitation. In any case, we plan to contact the patient by telephone for a follow-up status report regarding this interventional procedure. Comments:  No additional relevant information.  Plan of Care   Imaging Orders  No imaging studies ordered today   Procedure Orders    No procedure(s) ordered today    Medications ordered for procedure: No orders of the defined types were placed in this encounter.  Medications administered: Ms. Baird had no medications administered during this visit.  See the medical record for exact dosing, route, and time of administration.  New Prescriptions   No medications on file   Disposition: Discharge home  Discharge Date & Time: 06/30/2017;   hrs.   Physician-requested Follow-up: No Follow-up on file. Future Appointments Date Time Provider Department Center  06/30/2017 10:15 AM Delano Metz, MD [facility] None   Primary Care Physician: Steve Rattler, NP Location: [facility] Facility Note by: Oswaldo Done, MD Date: 06/30/2017; Time: 7:51 AM  Disclaimer:  Medicine is not an Visual merchandiser. The only guarantee in medicine is that nothing is guaranteed. It is important to note that the decision to proceed with this intervention was based on the information collected from the patient. The Data and conclusions were drawn from the patient's questionnaire, the interview, and the physical examination. Because the information was provided in large part by  the patient, it cannot be guaranteed that it has not been purposely or unconsciously manipulated. Every effort has been made to obtain as much relevant data as possible for this evaluation. It is important to note that the conclusions that lead to this procedure are derived in large part from the available data. Always take into account that the treatment will also be dependent on availability of resources and existing treatment guidelines, considered by other Pain Management Practitioners as being common knowledge and practice, at the time of the intervention. For Medico-Legal purposes, it is also important to point out that variation in procedural techniques  and pharmacological choices are the acceptable norm. The indications, contraindications, technique, and results of the above procedure should only be interpreted and judged by a Board-Certified Interventional Pain Specialist with extensive familiarity and expertise in the same exact procedure and technique.

## 2017-07-02 ENCOUNTER — Ambulatory Visit: Payer: Medicaid Other | Admitting: Pain Medicine

## 2017-07-28 ENCOUNTER — Ambulatory Visit
Admission: RE | Admit: 2017-07-28 | Discharge: 2017-07-28 | Disposition: A | Discharge status: Home / Self Care | Payer: Medicaid Other | Source: Ambulatory Visit | Attending: Pain Medicine | Admitting: Pain Medicine

## 2017-07-28 ENCOUNTER — Ambulatory Visit: Payer: Medicaid Other | Admitting: Pain Medicine

## 2017-07-28 ENCOUNTER — Encounter: Payer: Self-pay | Admitting: Pain Medicine

## 2017-07-28 VITALS — BP 176/103 | HR 106 | Temp 97.4°F | Resp 20 | Ht 62.0 in | Wt 258.0 lb

## 2017-07-28 DIAGNOSIS — M4722 Other spondylosis with radiculopathy, cervical region: Secondary | ICD-10-CM | POA: Insufficient documentation

## 2017-07-28 DIAGNOSIS — M542 Cervicalgia: Secondary | ICD-10-CM

## 2017-07-28 DIAGNOSIS — M5412 Radiculopathy, cervical region: Secondary | ICD-10-CM | POA: Diagnosis not present

## 2017-07-28 DIAGNOSIS — M5030 Other cervical disc degeneration, unspecified cervical region: Secondary | ICD-10-CM | POA: Insufficient documentation

## 2017-07-28 DIAGNOSIS — G8929 Other chronic pain: Secondary | ICD-10-CM

## 2017-07-28 DIAGNOSIS — Z880 Allergy status to penicillin: Secondary | ICD-10-CM | POA: Insufficient documentation

## 2017-07-28 DIAGNOSIS — Z888 Allergy status to other drugs, medicaments and biological substances status: Secondary | ICD-10-CM | POA: Diagnosis not present

## 2017-07-28 MED ORDER — DEXAMETHASONE SODIUM PHOSPHATE 10 MG/ML IJ SOLN
10.0000 mg | Freq: Once | INTRAMUSCULAR | Status: AC
Start: 2017-07-28 — End: 2017-07-28
  Administered 2017-07-28: 10 mg
  Filled 2017-07-28: qty 1

## 2017-07-28 MED ORDER — LACTATED RINGERS IV SOLN
1000.0000 mL | Freq: Once | INTRAVENOUS | Status: AC
  Administered 2017-07-28: 1000 mL via INTRAVENOUS

## 2017-07-28 MED ORDER — IOPAMIDOL (ISOVUE-M 200) INJECTION 41%
10.0000 mL | Freq: Once | INTRAMUSCULAR | Status: AC
  Administered 2017-07-28: 10 mL via EPIDURAL
  Filled 2017-07-28: qty 10

## 2017-07-28 MED ORDER — FENTANYL CITRATE (PF) 100 MCG/2ML IJ SOLN
25.0000 ug | INTRAMUSCULAR | Status: DC | PRN
  Administered 2017-07-28: 100 ug via INTRAVENOUS
  Filled 2017-07-28: qty 2

## 2017-07-28 MED ORDER — SODIUM CHLORIDE 0.9% FLUSH
1.0000 mL | Freq: Once | INTRAVENOUS | Status: AC
  Administered 2017-07-28: 1 mL

## 2017-07-28 MED ORDER — MIDAZOLAM HCL 5 MG/5ML IJ SOLN
1.0000 mg | INTRAMUSCULAR | Status: DC | PRN
Start: 2017-07-28 — End: 2017-07-28
  Administered 2017-07-28: 3 mg via INTRAVENOUS
  Filled 2017-07-28: qty 5

## 2017-07-28 MED ORDER — LIDOCAINE HCL 2 % IJ SOLN
10.0000 mL | Freq: Once | INTRAMUSCULAR | Status: AC
  Administered 2017-07-28: 400 mg
  Filled 2017-07-28: qty 40

## 2017-07-28 MED ORDER — ROPIVACAINE HCL 2 MG/ML IJ SOLN
1.0000 mL | Freq: Once | INTRAMUSCULAR | Status: AC
Start: 2017-07-28 — End: 2017-07-28
  Administered 2017-07-28: 1 mL via EPIDURAL
  Filled 2017-07-28: qty 10

## 2017-07-28 NOTE — Patient Instructions (Addendum)
____________________________________________________________________________________________  Post-Procedure instructions Instructions:  Apply ice: Fill a plastic sandwich bag with crushed ice. Cover it with a small towel and apply to injection site. Apply for 15 minutes then remove x 15 minutes. Repeat sequence on day of procedure, until you go to bed. The purpose is to minimize swelling and discomfort after procedure.  Apply heat: Apply heat to procedure site starting the day following the procedure. The purpose is to treat any soreness and discomfort from the procedure.  Food intake: Start with clear liquids (like water) and advance to regular food, as tolerated.   Physical activities: Keep activities to a minimum for the first 8 hours after the procedure.   Driving: If you have received any sedation, you are not allowed to drive for 24 hours after your procedure.  Blood thinner: Restart your blood thinner 6 hours after your procedure. (Only for those taking blood thinners)  Insulin: As soon as you can eat, you may resume your normal dosing schedule. (Only for those taking insulin)  Infection prevention: Keep procedure site clean and dry.  Post-procedure Pain Diary: Extremely important that this be done correctly and accurately. Recorded information will be used to determine the next step in treatment.  Pain evaluated is that of treated area only. Do not include pain from an untreated area.  Complete every hour, on the hour, for the initial 8 hours. Set an alarm to help you do this part accurately.  Do not go to sleep and have it completed later. It will not be accurate.  Follow-up appointment: Keep your follow-up appointment after the procedure. Usually 2 weeks for most procedures. (6 weeks in the case of radiofrequency.) Bring you pain diary.  Expect:  From numbing medicine (AKA: Local Anesthetics): Numbness or decrease in pain.  Onset: Full effect within 15 minutes of  injected.  Duration: It will depend on the type of local anesthetic used. On the average, 1 to 8 hours.   From steroids: Decrease in swelling or inflammation. Once inflammation is improved, relief of the pain will follow.  Onset of benefits: Depends on the amount of swelling present. The more swelling, the longer it will take for the benefits to be seen. In some cases, up to 10 days.  Duration: Steroids will stay in the system x 2 weeks. Duration of benefits will depend on multiple posibilities including persistent irritating factors.  From procedure: Some discomfort is to be expected once the numbing medicine wears off. This should be minimal if ice and heat are applied as instructed. Call if:  You experience numbness and weakness that gets worse with time, as opposed to wearing off.  New onset bowel or bladder incontinence. (Spinal procedures only)  Emergency Numbers:  Durning business hours (Monday - Thursday, 8:00 AM - 4:00 PM) (Friday, 9:00 AM - 12:00 Noon): [phone]  After hours: [phone] ____________________________________________________________________________________________   Pain Management Discharge Instructions  General Discharge Instructions :  If you need to reach your doctor call: Monday-Friday 8:00 am - 4:00 pm at [phone] or toll free [phone].  After clinic hours [phone] to have operator reach doctor.  Bring all of your medication bottles to all your appointments in the pain clinic.  To cancel or reschedule your appointment with Pain Management please remember to call 24 hours in advance to avoid a fee.  Refer to the educational materials which you have been given on: General Risks, I had my Procedure. Discharge Instructions, Post Sedation.  Post Procedure Instructions:  The drugs  you were given will stay in your system until tomorrow, so for the next 24 hours you should not drive, make any legal decisions or drink any alcoholic  beverages.  You may eat anything you prefer, but it is better to start with liquids then soups and crackers, and gradually work up to solid foods.  Please notify your doctor immediately if you have any unusual bleeding, trouble breathing or pain that is not related to your normal pain.  Depending on the type of procedure that was done, some parts of your body may feel week and/or numb.  This usually clears up by tonight or the next day.  Walk with the use of an assistive device or accompanied by an adult for the 24 hours.  You may use ice on the affected area for the first 24 hours.  Put ice in a Ziploc bag and cover with a towel and place against area 15 minutes on 15 minutes off.  You may switch to heat after 24 hours. Epidural Steroid Injection An epidural steroid injection is a shot of steroid medicine and numbing medicine that is given into the space between the spinal cord and the bones in your back (epidural space). The shot helps relieve pain caused by an irritated or swollen nerve root. The amount of pain relief you get from the injection depends on what is causing the nerve to be swollen and irritated, and how long your pain lasts. You are more likely to benefit from this injection if your pain is strong and comes on suddenly rather than if you have had pain for a long time. Tell a health care provider about:  Any allergies you have.  All medicines you are taking, including vitamins, herbs, eye drops, creams, and over-the-counter medicines.  Any problems you or family members have had with anesthetic medicines.  Any blood disorders you have.  Any surgeries you have had.  Any medical conditions you have.  Whether you are pregnant or may be pregnant. What are the risks? Generally, this is a safe procedure. However, problems may occur, including:  Headache.  Bleeding.  Infection.  Allergic reaction to medicines.  Damage to your nerves.  What happens before the  procedure? Staying hydrated Follow instructions from your health care provider about hydration, which may include:  Up to 2 hours before the procedure - you may continue to drink clear liquids, such as water, clear fruit juice, black coffee, and plain tea.  Eating and drinking restrictions Follow instructions from your health care provider about eating and drinking, which may include:  8 hours before the procedure - stop eating heavy meals or foods such as meat, fried foods, or fatty foods.  6 hours before the procedure - stop eating light meals or foods, such as toast or cereal.  6 hours before the procedure - stop drinking milk or drinks that contain milk.  2 hours before the procedure - stop drinking clear liquids.  Medicine  You may be given medicines to lower anxiety.  Ask your health care provider about: ? Changing or stopping your regular medicines. This is especially important if you are taking diabetes medicines or blood thinners. ? Taking medicines such as aspirin and ibuprofen. These medicines can thin your blood. Do not take these medicines before your procedure if your health care provider instructs you not to. General instructions  Plan to have someone take you home from the hospital or clinic. What happens during the procedure?  You may receive a  medicine to help you relax (sedative).  You will be asked to lie on your abdomen.  The injection site will be cleaned.  A numbing medicine (local anesthetic) will be used to numb the injection site.  A needle will be inserted through your skin into the epidural space. You may feel some discomfort when this happens. An X-ray machine will be used to make sure the needle is put as close as possible to the affected nerve.  A steroid medicine and a local anesthetic will be injected into the epidural space.  The needle will be removed.  A bandage (dressing) will be put over the injection site. What happens after the  procedure?  Your blood pressure, heart rate, breathing rate, and blood oxygen level will be monitored until the medicines you were given have worn off.  Your arm or leg may feel weak or numb for a few hours.  The injection site may feel sore.  Do not drive for 24 hours if you received a sedative. This information is not intended to replace advice given to you by your health care provider. Make sure you discuss any questions you have with your health care provider. Document Released: 12/16/2007 Document Revised: 02/20/2016 Document Reviewed: 12/25/2015 [employer] Interactive Patient Education  2017 [employer] Pain Management Discharge Instructions  General Discharge Instructions :  If you need to reach your doctor call: Monday-Friday 8:00 am - 4:00 pm at [phone] or toll free [phone].  After clinic hours [phone] to have operator reach doctor.  Bring all of your medication bottles to all your appointments in the pain clinic.  To cancel or reschedule your appointment with Pain Management please remember to call 24 hours in advance to avoid a fee.  Refer to the educational materials which you have been given on: General Risks, I had my Procedure. Discharge Instructions, Post Sedation.  Post Procedure Instructions:  The drugs you were given will stay in your system until tomorrow, so for the next 24 hours you should not drive, make any legal decisions or drink any alcoholic beverages.  You may eat anything you prefer, but it is better to start with liquids then soups and crackers, and gradually work up to solid foods.  Please notify your doctor immediately if you have any unusual bleeding, trouble breathing or pain that is not related to your normal pain.  Depending on the type of procedure that was done, some parts of your body may feel week and/or numb.  This usually clears up by tonight or the next day.  Walk with the use of an assistive device or accompanied by an  adult for the 24 hours.  You may use ice on the affected area for the first 24 hours.  Put ice in a Ziploc bag and cover with a towel and place against area 15 minutes on 15 minutes off.  You may switch to heat after 24 hours.

## 2017-07-28 NOTE — Progress Notes (Signed)
Safety precautions to be maintained throughout the outpatient stay will include: orient to surroundings, keep bed in low position, maintain call bell within reach at all times, provide assistance with transfer out of bed and ambulation.  

## 2017-07-28 NOTE — Progress Notes (Deleted)
Patient's Name: Morgan Patel  MRN: [id number]  Referring Provider: Steve Rattler, NP  DOB: [birth year]  PCP: Steve Rattler, NP  DOS: 07/28/2017  Note by: Oswaldo Done, MD  Service setting: Ambulatory outpatient  Specialty: Interventional Pain Management  Patient type: Established  Location: [facility] (AMB) Pain Management Facility  Visit type: Interventional Procedure   Primary Reason for Visit: Interventional Pain Management Treatment. CC: No chief complaint on file.  Procedure:  Anesthesia, Analgesia, Anxiolysis:  Type: Diagnostic, Inter-Laminar, Epidural Steroid Injection Region: Posterior Cervico-thoracic Region Level: C7-T1 Laterality: Left-Sided Paramedial  Type: Local Anesthesia with Moderate (Conscious) Sedation Local Anesthetic: Lidocaine 1% Route: Intravenous (IV) IV Access: Secured Sedation: Meaningful verbal contact was maintained at all times during the procedure  Indication(s): Analgesia and Anxiety   Indications: 1. Chronic cervical radicular pain (Bilateral)   2. Cervical spondylosis   3. Chronic neck pain (Primary Area of Pain) (Bilateral) (L>R)   4. DDD (degenerative disc disease), cervical    Pain Score: Pre-procedure:  /10 Post-procedure:  /10  Pre-op Assessment:  Ms. Angert is a 46 y.o. (year old), female patient, seen today for interventional treatment. She  has no past surgical history on file. Ms. Hinsch has a current medication list which includes the following prescription(s): aspirin ec, atorvastatin, cyclobenzaprine, ergocalciferol, fluticasone, glipizide, glucose blood, hydralazine, hydroxyzine, ibuprofen, metformin, nitroglycerin, pregabalin, tramadol, venlafaxine xr, and verapamil. Her primarily concern today is the No chief complaint on file.  Initial Vital Signs: There were no vitals taken for this visit. BMI: Estimated body mass index is 47.48 kg/m as calculated from the following:   Height as of 06/22/17: 5\' 2"  (1.575 m).   Weight as  of 06/22/17: 259 lb 9.6 oz (117.8 kg).  Risk Assessment: Allergies: Reviewed. She is allergic to amlodipine; quinapril; hydrochlorothiazide; atenolol; and penicillins.  Allergy Precautions: None required Coagulopathies: Reviewed. None identified.  Blood-thinner therapy: None at this time Active Infection(s): Reviewed. None identified. Ms. Console is afebrile  Site Confirmation: Ms. Livingstone was asked to confirm the procedure and laterality before marking the site Procedure checklist: Completed Consent: Before the procedure and under the influence of no sedative(s), amnesic(s), or anxiolytics, the patient was informed of the treatment options, risks and possible complications. To fulfill our ethical and legal obligations, as recommended by the American Medical Association's Code of Ethics, I have informed the patient of my clinical impression; the nature and purpose of the treatment or procedure; the risks, benefits, and possible complications of the intervention; the alternatives, including doing nothing; the risk(s) and benefit(s) of the alternative treatment(s) or procedure(s); and the risk(s) and benefit(s) of doing nothing. The patient was provided information about the general risks and possible complications associated with the procedure. These may include, but are not limited to: failure to achieve desired goals, infection, bleeding, organ or nerve damage, allergic reactions, paralysis, and death. In addition, the patient was informed of those risks and complications associated to Spine-related procedures, such as failure to decrease pain; infection (i.e.: Meningitis, epidural or intraspinal abscess); bleeding (i.e.: epidural hematoma, subarachnoid hemorrhage, or any other type of intraspinal or peri-dural bleeding); organ or nerve damage (i.e.: Any type of peripheral nerve, nerve root, or spinal cord injury) with subsequent damage to sensory, motor, and/or autonomic systems, resulting in permanent  pain, numbness, and/or weakness of one or several areas of the body; allergic reactions; (i.e.: anaphylactic reaction); and/or death. Furthermore, the patient was informed of those risks and complications associated with the medications. These include, but are not  limited to: allergic reactions (i.e.: anaphylactic or anaphylactoid reaction(s)); adrenal axis suppression; blood sugar elevation that in diabetics may result in ketoacidosis or comma; water retention that in patients with history of congestive heart failure may result in shortness of breath, pulmonary edema, and decompensation with resultant heart failure; weight gain; swelling or edema; medication-induced neural toxicity; particulate matter embolism and blood vessel occlusion with resultant organ, and/or nervous system infarction; and/or aseptic necrosis of one or more joints. Finally, the patient was informed that Medicine is not an exact science; therefore, there is also the possibility of unforeseen or unpredictable risks and/or possible complications that may result in a catastrophic outcome. The patient indicated having understood very clearly. We have given the patient no guarantees and we have made no promises. Enough time was given to the patient to ask questions, all of which were answered to the patient's satisfaction. Ms. Oyola has indicated that she wanted to continue with the procedure. Attestation: I, the ordering provider, attest that I have discussed with the patient the benefits, risks, side-effects, alternatives, likelihood of achieving goals, and potential problems during recovery for the procedure that I have provided informed consent. Date: 07/28/2017; Time: 7:45 AM  Pre-Procedure Preparation:  Monitoring: As per clinic protocol. Respiration, ETCO2, SpO2, BP, heart rate and rhythm monitor placed and checked for adequate function Safety Precautions: Patient was assessed for positional comfort and pressure points before  starting the procedure. Time-out: I initiated and conducted the "Time-out" before starting the procedure, as per protocol. The patient was asked to participate by confirming the accuracy of the "Time Out" information. Verification of the correct person, site, and procedure were performed and confirmed by me, the nursing staff, and the patient. "Time-out" conducted as per Joint Commission's Universal Protocol (UP.01.01.01). "Time-out" Date & Time: 07/28/2017;   hrs.  Description of Procedure Process:   Position: Prone with head of the table was raised to facilitate breathing. Target Area: For Epidural Steroid injections the target is the interlaminar space, initially targeting the lower border of the superior vertebral body lamina. Approach: Paramedial approach. Area Prepped: Entire PosteriorCervical Region Prepping solution: ChloraPrep (2% chlorhexidine gluconate and 70% isopropyl alcohol) Safety Precautions: Aspiration looking for blood return was conducted prior to all injections. At no point did we inject any substances, as a needle was being advanced. No attempts were made at seeking any paresthesias. Safe injection practices and needle disposal techniques used. Medications properly checked for expiration dates. SDV (single dose vial) medications used. Description of the Procedure: Protocol guidelines were followed. The procedure needle was introduced through the skin, ipsilateral to the reported pain, and advanced to the target area. Bone was contacted and the needle walked caudad, until the lamina was cleared. The epidural space was identified using "loss-of-resistance technique" with 2-3 ml of PF-NaCl (0.9% NSS), in a 5cc LOR glass syringe. There were no vitals filed for this visit.  Start Time:   hrs. End Time:   hrs. Materials:  Needle(s) Type: Epidural needle Gauge: 17G Length: 3.5-in Medication(s): Cherylyn D. Goertz had no medications administered during this visit. Please see chart  orders for dosing details.  Imaging Guidance (Spinal):  Type of Imaging Technique: Fluoroscopy Guidance (Spinal) Indication(s): Assistance in needle guidance and placement for procedures requiring needle placement in or near specific anatomical locations not easily accessible without such assistance. Exposure Time: Please see nurses notes. Contrast: Before injecting any contrast, we confirmed that the patient did not have an allergy to iodine, shellfish, or radiological contrast. Once satisfactory needle  placement was completed at the desired level, radiological contrast was injected. Contrast injected under live fluoroscopy. No contrast complications. See chart for type and volume of contrast used. Fluoroscopic Guidance: I was personally present during the use of fluoroscopy. "Tunnel Vision Technique" used to obtain the best possible view of the target area. Parallax error corrected before commencing the procedure. "Direction-depth-direction" technique used to introduce the needle under continuous pulsed fluoroscopy. Once target was reached, antero-posterior, oblique, and lateral fluoroscopic projection used confirm needle placement in all planes. Images permanently stored in EMR. Interpretation: I personally interpreted the imaging intraoperatively. Adequate needle placement confirmed in multiple planes. Appropriate spread of contrast into desired area was observed. No evidence of afferent or efferent intravascular uptake. No intrathecal or subarachnoid spread observed. Permanent images saved into the patient's record.  Antibiotic Prophylaxis:  Indication(s): None identified Antibiotic given: None  Post-operative Assessment:  EBL: None Complications: No immediate post-treatment complications observed by team, or reported by patient. Note: The patient tolerated the entire procedure well. A repeat set of vitals were taken after the procedure and the patient was kept under observation following  institutional policy, for this type of procedure. Post-procedural neurological assessment was performed, showing return to baseline, prior to discharge. The patient was provided with post-procedure discharge instructions, including a section on how to identify potential problems. Should any problems arise concerning this procedure, the patient was given instructions to immediately contact us, at any time, without hesitation. In any case, we plan to contact the patient by telephone for a follow-up status report regarding this interventional procedure. Comments:  No additional relevant information.  Plan of Care   Imaging Orders  No imaging studies ordered today   Procedure Orders    No procedure(s) ordered today    Medications ordered for procedure: No orders of the defined types were placed in this encounter.  Medications administered: Rayah D. Konecny had no medications administered during this visit.  See the medical record for exact dosing, route, and time of administration.  This SmartLink is deprecated. Use AVSMEDLIST instead to display the medication list for a patient. Disposition: Discharge home  Discharge Date & Time: 07/28/2017;   hrs.   Physician-requested Follow-up: No Follow-up on file. Future Appointments  Date Time Provider Department Center  07/28/2017  9:15 AM Delano Metz, MD [facility] None  07/28/2017  9:45 AM Delano Metz, MD [facility] None   Primary Care Physician: Steve Rattler, NP Location: [facility] Outpatient [facility] Management Facility Note by: Oswaldo Done, MD Date: 07/28/2017; Time: 7:45 AM  Disclaimer:  Medicine is not an Visual merchandiser. The only guarantee in medicine is that nothing is guaranteed. It is important to note that the decision to proceed with this intervention was based on the information collected from the patient. The Data and conclusions were drawn from the patient's questionnaire, the interview, and the physical examination.  Because the information was provided in large part by the patient, it cannot be guaranteed that it has not been purposely or unconsciously manipulated. Every effort has been made to obtain as much relevant data as possible for this evaluation. It is important to note that the conclusions that lead to this procedure are derived in large part from the available data. Always take into account that the treatment will also be dependent on availability of resources and existing treatment guidelines, considered by other Pain Management Practitioners as being common knowledge and practice, at the time of the intervention. For Medico-Legal purposes, it is also important to point  out that variation in procedural techniques and pharmacological choices are the acceptable norm. The indications, contraindications, technique, and results of the above procedure should only be interpreted and judged by a Board-Certified Interventional Pain Specialist with extensive familiarity and expertise in the same exact procedure and technique.

## 2017-07-28 NOTE — Progress Notes (Signed)
Patient's Name: Morgan Patel  MRN: [id number]  Referring Provider: Steve Rattler, NP  DOB: [birth year]  PCP: Steve Rattler, NP  DOS: 07/28/2017  Note by: Oswaldo Done, MD  Service setting: Ambulatory outpatient  Specialty: Interventional Pain Management  Patient type: Established  Location: [facility] (AMB) Pain Management Facility  Visit type: Interventional Procedure   Primary Reason for Visit: Interventional Pain Management Treatment. CC: Neck Pain  Procedure:  Anesthesia, Analgesia, Anxiolysis:  Type: Diagnostic, Inter-Laminar, Epidural Steroid Injection Region: Posterior Cervico-thoracic Region Level: C7-T1 Laterality: Left-Sided Paramedial  Type: Local Anesthesia with Moderate (Conscious) Sedation Local Anesthetic: Lidocaine 1% Route: Intravenous (IV) IV Access: Secured Sedation: Meaningful verbal contact was maintained at all times during the procedure  Indication(s): Analgesia and Anxiety   Indications: 1. Chronic cervical radicular pain (Bilateral)   2. Cervical spondylosis   3. Chronic neck pain (Primary Area of Pain) (Bilateral) (L>R)   4. DDD (degenerative disc disease), cervical   5. Chronic radicular cervical pain   6. Cervical spondylosis with radiculopathy   7. Chronic neck pain    Pain Score: Pre-procedure: 5 /10 Post-procedure: 0-No pain/10  Pre-op Assessment:  [name] is a 46 y.o. (year old), female patient, seen today for interventional treatment. She  has no past surgical history on file. [name] has a current medication list which includes the following prescription(s): aspirin ec, atorvastatin, cyclobenzaprine, ergocalciferol, glipizide, glucose blood, hydralazine, hydroxyzine, ibuprofen, metformin, nitroglycerin, pregabalin, venlafaxine xr, verapamil, fluticasone, and tramadol, and the following Facility-Administered Medications: fentanyl and midazolam. Her primarily concern today is the Neck Pain  Initial Vital Signs: There were no vitals  taken for this visit. BMI: Estimated body mass index is 47.19 kg/m as calculated from the following:   Height as of this encounter: 5\' 2"  (1.575 m).   Weight as of this encounter: 258 lb (117 kg).  Risk Assessment: Allergies: Reviewed. She is allergic to amlodipine; quinapril; hydrochlorothiazide; atenolol; and penicillins.  Allergy Precautions: None required Coagulopathies: Reviewed. None identified.  Blood-thinner therapy: None at this time Active Infection(s): Reviewed. None identified. Ms. Alvino is afebrile  Site Confirmation: [name] was asked to confirm the procedure and laterality before marking the site Procedure checklist: Completed Consent: Before the procedure and under the influence of no sedative(s), amnesic(s), or anxiolytics, the patient was informed of the treatment options, risks and possible complications. To fulfill our ethical and legal obligations, as recommended by the American Medical Association's Code of Ethics, I have informed the patient of my clinical impression; the nature and purpose of the treatment or procedure; the risks, benefits, and possible complications of the intervention; the alternatives, including doing nothing; the risk(s) and benefit(s) of the alternative treatment(s) or procedure(s); and the risk(s) and benefit(s) of doing nothing. The patient was provided information about the general risks and possible complications associated with the procedure. These may include, but are not limited to: failure to achieve desired goals, infection, bleeding, organ or nerve damage, allergic reactions, paralysis, and death. In addition, the patient was informed of those risks and complications associated to Spine-related procedures, such as failure to decrease pain; infection (i.e.: Meningitis, epidural or intraspinal abscess); bleeding (i.e.: epidural hematoma, subarachnoid hemorrhage, or any other type of intraspinal or peri-dural bleeding); organ or nerve damage  (i.e.: Any type of peripheral nerve, nerve root, or spinal cord injury) with subsequent damage to sensory, motor, and/or autonomic systems, resulting in permanent pain, numbness, and/or weakness of one or several areas of the body; allergic reactions; (i.e.: anaphylactic  reaction); and/or death. Furthermore, the patient was informed of those risks and complications associated with the medications. These include, but are not limited to: allergic reactions (i.e.: anaphylactic or anaphylactoid reaction(s)); adrenal axis suppression; blood sugar elevation that in diabetics may result in ketoacidosis or comma; water retention that in patients with history of congestive heart failure may result in shortness of breath, pulmonary edema, and decompensation with resultant heart failure; weight gain; swelling or edema; medication-induced neural toxicity; particulate matter embolism and blood vessel occlusion with resultant organ, and/or nervous system infarction; and/or aseptic necrosis of one or more joints. Finally, the patient was informed that Medicine is not an exact science; therefore, there is also the possibility of unforeseen or unpredictable risks and/or possible complications that may result in a catastrophic outcome. The patient indicated having understood very clearly. We have given the patient no guarantees and we have made no promises. Enough time was given to the patient to ask questions, all of which were answered to the patient's satisfaction. [name] has indicated that she wanted to continue with the procedure. Attestation: I, the ordering provider, attest that I have discussed with the patient the benefits, risks, side-effects, alternatives, likelihood of achieving goals, and potential problems during recovery for the procedure that I have provided informed consent. Date: 07/28/2017; Time: 7:38 AM  Pre-Procedure Preparation:  Monitoring: As per clinic protocol. Respiration, ETCO2, SpO2, BP, heart  rate and rhythm monitor placed and checked for adequate function Safety Precautions: Patient was assessed for positional comfort and pressure points before starting the procedure. Time-out: I initiated and conducted the "Time-out" before starting the procedure, as per protocol. The patient was asked to participate by confirming the accuracy of the "Time Out" information. Verification of the correct person, site, and procedure were performed and confirmed by me, the nursing staff, and the patient. "Time-out" conducted as per Joint Commission's Universal Protocol (UP.01.01.01). "Time-out" Date & Time: 07/28/2017; 1004 hrs.  Description of Procedure Process:   Position: Prone with head of the table was raised to facilitate breathing. Target Area: For Epidural Steroid injections the target is the interlaminar space, initially targeting the lower border of the superior vertebral body lamina. Approach: Paramedial approach. Area Prepped: Entire PosteriorCervical Region Prepping solution: ChloraPrep (2% chlorhexidine gluconate and 70% isopropyl alcohol) Safety Precautions: Aspiration looking for blood return was conducted prior to all injections. At no point did we inject any substances, as a needle was being advanced. No attempts were made at seeking any paresthesias. Safe injection practices and needle disposal techniques used. Medications properly checked for expiration dates. SDV (single dose vial) medications used. Description of the Procedure: Protocol guidelines were followed. The procedure needle was introduced through the skin, ipsilateral to the reported pain, and advanced to the target area. Bone was contacted and the needle walked caudad, until the lamina was cleared. The epidural space was identified using "loss-of-resistance technique" with 2-3 ml of PF-NaCl (0.9% NSS), in a 5cc LOR glass syringe. Vitals:   07/28/17 1012 07/28/17 1022 07/28/17 1032 07/28/17 1042  BP: 133/84 (!) 181/97 (!) 184/101  (!) 176/103  Pulse:      Resp: 20 20 20 20   Temp:  (!) 97.4 F (36.3 C)    TempSrc:      SpO2: 94% 95% 98% 94%  Weight:      Height:        Start Time: 1004 hrs. End Time: 1011 hrs. Materials:  Needle(s) Type: Epidural needle Gauge: 17G Length: 3.5-in Medication(s): We administered lactated ringers,  midazolam, fentaNYL, iopamidol, dexamethasone, ropivacaine (PF) 2 mg/mL (0.2%), sodium chloride flush, and lidocaine. Please see chart orders for dosing details.  Imaging Guidance (Spinal):  Type of Imaging Technique: Fluoroscopy Guidance (Spinal) Indication(s): Assistance in needle guidance and placement for procedures requiring needle placement in or near specific anatomical locations not easily accessible without such assistance. Exposure Time: Please see nurses notes. Contrast: Before injecting any contrast, we confirmed that the patient did not have an allergy to iodine, shellfish, or radiological contrast. Once satisfactory needle placement was completed at the desired level, radiological contrast was injected. Contrast injected under live fluoroscopy. No contrast complications. See chart for type and volume of contrast used. Fluoroscopic Guidance: I was personally present during the use of fluoroscopy. "Tunnel Vision Technique" used to obtain the best possible view of the target area. Parallax error corrected before commencing the procedure. "Direction-depth-direction" technique used to introduce the needle under continuous pulsed fluoroscopy. Once target was reached, antero-posterior, oblique, and lateral fluoroscopic projection used confirm needle placement in all planes. Images permanently stored in EMR. Interpretation: I personally interpreted the imaging intraoperatively. Adequate needle placement confirmed in multiple planes. Appropriate spread of contrast into desired area was observed. No evidence of afferent or efferent intravascular uptake. No intrathecal or subarachnoid spread  observed. Permanent images saved into the patient's record.  Antibiotic Prophylaxis:  Indication(s): None identified Antibiotic given: None  Post-operative Assessment:  EBL: None Complications: No immediate post-treatment complications observed by team, or reported by patient. Note: The patient tolerated the entire procedure well. A repeat set of vitals were taken after the procedure and the patient was kept under observation following institutional policy, for this type of procedure. Post-procedural neurological assessment was performed, showing return to baseline, prior to discharge. The patient was provided with post-procedure discharge instructions, including a section on how to identify potential problems. Should any problems arise concerning this procedure, the patient was given instructions to immediately contact us, at any time, without hesitation. In any case, we plan to contact the patient by telephone for a follow-up status report regarding this interventional procedure. Comments:  No additional relevant information.  Plan of Care    Imaging Orders     DG C-Arm 1-60 Min-No Report  Procedure Orders     Cervical Epidural Injection  Medications ordered for procedure: Meds ordered this encounter  Medications  . lactated ringers infusion 1,000 mL  . midazolam (VERSED) 5 MG/5ML injection 1-2 mg    Make sure Flumazenil is available in the pyxis when using this medication. If oversedation occurs, administer 0.2 mg IV over 15 sec. If after 45 sec no response, administer 0.2 mg again over 1 min; may repeat at 1 min intervals; not to exceed 4 doses (1 mg)  . fentaNYL (SUBLIMAZE) injection 25-50 mcg    Make sure Narcan is available in the pyxis when using this medication. In the event of respiratory depression (RR< 8/min): Titrate NARCAN (naloxone) in increments of 0.1 to 0.2 mg IV at 2-3 minute intervals, until desired degree of reversal.  . iopamidol (ISOVUE-M) 41 % intrathecal  injection 10 mL  . dexamethasone (DECADRON) injection 10 mg  . ropivacaine (PF) 2 mg/mL (0.2%) (NAROPIN) injection 1 mL  . sodium chloride flush (NS) 0.9 % injection 1 mL  . lidocaine (XYLOCAINE) 2 % (with pres) injection 200 mg   Medications administered: We administered lactated ringers, midazolam, fentaNYL, iopamidol, dexamethasone, ropivacaine (PF) 2 mg/mL (0.2%), sodium chloride flush, and lidocaine.  See the medical record for exact dosing, route,  and time of administration.  This SmartLink is deprecated. Use AVSMEDLIST instead to display the medication list for a patient. Disposition: Discharge home  Discharge Date & Time: 07/28/2017; 1045 hrs.   Physician-requested Follow-up: Return for post-procedure eval by Dr. Laban Emperor in 2 wks. Future Appointments  Date Time Provider Department Center  08/17/2017  1:15 PM Delano Metz, MD [facility] None   Primary Care Physician: Steve Rattler, NP Location: [facility] Outpatient Pain Management Facility Note by: Oswaldo Done, MD Date: 07/28/2017; Time: 11:08 AM  Disclaimer:  Medicine is not an Visual merchandiser. The only guarantee in medicine is that nothing is guaranteed. It is important to note that the decision to proceed with this intervention was based on the information collected from the patient. The Data and conclusions were drawn from the patient's questionnaire, the interview, and the physical examination. Because the information was provided in large part by the patient, it cannot be guaranteed that it has not been purposely or unconsciously manipulated. Every effort has been made to obtain as much relevant data as possible for this evaluation. It is important to note that the conclusions that lead to this procedure are derived in large part from the available data. Always take into account that the treatment will also be dependent on availability of resources and existing treatment guidelines, considered by other Pain Management  Practitioners as being common knowledge and practice, at the time of the intervention. For Medico-Legal purposes, it is also important to point out that variation in procedural techniques and pharmacological choices are the acceptable norm. The indications, contraindications, technique, and results of the above procedure should only be interpreted and judged by a Board-Certified Interventional Pain Specialist with extensive familiarity and expertise in the same exact procedure and technique.

## 2017-07-29 ENCOUNTER — Telehealth: Payer: Self-pay

## 2017-07-29 NOTE — Telephone Encounter (Signed)
Called patient.States she is doing a lot better. Pt has no pain.

## 2017-08-17 ENCOUNTER — Ambulatory Visit: Payer: Medicaid Other | Admitting: Pain Medicine

## 2017-08-26 ENCOUNTER — Telehealth: Payer: Self-pay

## 2017-08-26 ENCOUNTER — Encounter: Payer: Self-pay | Admitting: Pain Medicine

## 2017-08-26 ENCOUNTER — Other Ambulatory Visit: Payer: Self-pay

## 2017-08-26 ENCOUNTER — Ambulatory Visit: Payer: Medicaid Other | Attending: Pain Medicine | Admitting: Pain Medicine

## 2017-08-26 VITALS — BP 148/99 | HR 87 | Temp 99.0°F | Ht 62.0 in | Wt 250.0 lb

## 2017-08-26 DIAGNOSIS — G8929 Other chronic pain: Secondary | ICD-10-CM | POA: Diagnosis not present

## 2017-08-26 DIAGNOSIS — E559 Vitamin D deficiency, unspecified: Secondary | ICD-10-CM | POA: Diagnosis not present

## 2017-08-26 DIAGNOSIS — F329 Major depressive disorder, single episode, unspecified: Secondary | ICD-10-CM | POA: Diagnosis not present

## 2017-08-26 DIAGNOSIS — M545 Low back pain: Secondary | ICD-10-CM | POA: Insufficient documentation

## 2017-08-26 DIAGNOSIS — F17210 Nicotine dependence, cigarettes, uncomplicated: Secondary | ICD-10-CM | POA: Insufficient documentation

## 2017-08-26 DIAGNOSIS — M5442 Lumbago with sciatica, left side: Secondary | ICD-10-CM | POA: Diagnosis not present

## 2017-08-26 DIAGNOSIS — E785 Hyperlipidemia, unspecified: Secondary | ICD-10-CM | POA: Insufficient documentation

## 2017-08-26 DIAGNOSIS — I10 Essential (primary) hypertension: Secondary | ICD-10-CM | POA: Diagnosis not present

## 2017-08-26 DIAGNOSIS — M5412 Radiculopathy, cervical region: Secondary | ICD-10-CM

## 2017-08-26 DIAGNOSIS — M4722 Other spondylosis with radiculopathy, cervical region: Secondary | ICD-10-CM | POA: Diagnosis not present

## 2017-08-26 DIAGNOSIS — M5136 Other intervertebral disc degeneration, lumbar region: Secondary | ICD-10-CM | POA: Diagnosis not present

## 2017-08-26 DIAGNOSIS — E118 Type 2 diabetes mellitus with unspecified complications: Secondary | ICD-10-CM | POA: Insufficient documentation

## 2017-08-26 DIAGNOSIS — M5010 Cervical disc disorder with radiculopathy, unspecified cervical region: Secondary | ICD-10-CM | POA: Diagnosis not present

## 2017-08-26 DIAGNOSIS — Z79899 Other long term (current) drug therapy: Secondary | ICD-10-CM | POA: Insufficient documentation

## 2017-08-26 DIAGNOSIS — M533 Sacrococcygeal disorders, not elsewhere classified: Secondary | ICD-10-CM | POA: Insufficient documentation

## 2017-08-26 DIAGNOSIS — M5441 Lumbago with sciatica, right side: Secondary | ICD-10-CM | POA: Diagnosis not present

## 2017-08-26 DIAGNOSIS — M47816 Spondylosis without myelopathy or radiculopathy, lumbar region: Secondary | ICD-10-CM | POA: Diagnosis not present

## 2017-08-26 DIAGNOSIS — K219 Gastro-esophageal reflux disease without esophagitis: Secondary | ICD-10-CM | POA: Insufficient documentation

## 2017-08-26 DIAGNOSIS — Z8673 Personal history of transient ischemic attack (TIA), and cerebral infarction without residual deficits: Secondary | ICD-10-CM | POA: Diagnosis not present

## 2017-08-26 DIAGNOSIS — I252 Old myocardial infarction: Secondary | ICD-10-CM | POA: Diagnosis not present

## 2017-08-26 DIAGNOSIS — M5116 Intervertebral disc disorders with radiculopathy, lumbar region: Secondary | ICD-10-CM | POA: Insufficient documentation

## 2017-08-26 DIAGNOSIS — M542 Cervicalgia: Secondary | ICD-10-CM | POA: Diagnosis not present

## 2017-08-26 DIAGNOSIS — I2510 Atherosclerotic heart disease of native coronary artery without angina pectoris: Secondary | ICD-10-CM | POA: Insufficient documentation

## 2017-08-26 DIAGNOSIS — M48061 Spinal stenosis, lumbar region without neurogenic claudication: Secondary | ICD-10-CM | POA: Insufficient documentation

## 2017-08-26 DIAGNOSIS — Z7982 Long term (current) use of aspirin: Secondary | ICD-10-CM | POA: Insufficient documentation

## 2017-08-26 DIAGNOSIS — Z7984 Long term (current) use of oral hypoglycemic drugs: Secondary | ICD-10-CM | POA: Insufficient documentation

## 2017-08-26 NOTE — Patient Instructions (Signed)
Patient instructed [place] [facility] mall to get xray with in one week ,then call to make a follow up appointment.

## 2017-08-26 NOTE — Progress Notes (Signed)
Safety precautions to be maintained throughout the outpatient stay will include: orient to surroundings, keep bed in low position, maintain call bell within reach at all times, provide assistance with transfer out of bed and ambulation.  

## 2017-08-26 NOTE — Telephone Encounter (Signed)
Called patient to notify her that she did not have to get an xray on her back.  Dr Laban Emperor will order a Lumbar facet block.  Pre procedure instructions given and patient states understanding and teach back 3 done.

## 2017-08-26 NOTE — Progress Notes (Addendum)
Patient's Name: Morgan Patel  MRN: [id number]  Referring Provider: Inge Rise, NP  DOB: December 29, 1970  PCP: Inge Rise, NP  DOS: 08/26/2017  Note by: Gaspar Cola, MD  Service setting: Ambulatory outpatient  Specialty: Interventional Pain Management  Location: [facility] (AMB) Pain Management Facility    Patient type: Established   Primary Reason(s) for Visit: Encounter for post-procedure evaluation of chronic illness with mild to moderate exacerbation CC: Back Pain (lower)  HPI  Morgan Patel is a 46 y.o. year old, female patient, who comes today for a post-procedure evaluation. She has Basilar artery stenosis; Coronary artery disease; DDD (degenerative disc disease), lumbar; Encounter for long-term (current) use of other high-risk medications; Encounter for tobacco use cessation counseling; Essential hypertension; Gastroesophageal reflux disease; Ischemic stroke ([place]); Lumbar facet arthropathy (Bilateral); Lumbosacral radiculopathy; Major depressive disorder with current active episode; Chronic myofascial pain; Nicotine dependence, uncomplicated; Other chronic pain; Tobacco use disorder; Type 2 diabetes mellitus with complication, without long-term current use of insulin ([place]); Visual field loss following stroke; Chronic neck pain (Primary Area of Pain) (Bilateral) (L>R); Chronic low back pain (Second Area of Pain) (Bilateral) (L>R); Chronic pain syndrome; Chronic sacroiliac joint pain (Bilateral) (L>R); Vitamin D deficiency; DDD (degenerative disc disease), cervical; Disorder of skeletal system; Pharmacologic therapy; Problems influencing health status; Chronic thoracic back pain (Bilateral) (L>R); Chronic upper extremity pain (Fourth Area of Pain) (Bilateral) (L>R); Lumbar facet syndrome (Bilateral) (L>R); Cervical spondylosis; Lumbar central spinal stenosis; Chronic lower extremity pain (Tertiary Area of Pain) (Bilateral) (L>R); Social fears; and Chronic cervical radicular pain (Bilateral)  on their problem list. Her primarily concern today is the Back Pain (lower)  Pain Assessment: Location: Lower Back Radiating: Denies Onset: More than a month ago Duration: Chronic pain Severity: 0-No pain/10 (self-reported pain score)  Note: Reported level is compatible with observation.                               Timing: Constant Modifying factors: laying down  Morgan Patel comes in today for post-procedure evaluation after the treatment done on 07/28/2017. The Patient comes in today for follow-up after her first left sided cervical epidural steroid injection. She has attained 100% relief of the pain. She is extremely happy with this. However, she still has some of the low back pain and now she would like for [place] to address that part. During today's evaluation it is clear to me that the patient does have a positive test for hyperextension and rotation suggesting the possibility of bilateral lumbar facet syndrome. In addition, her body habitus would support this possibility and that of lumbar facet osteoarthritis. We will arrange for the patient to come back for a diagnostic bilateral lumbar facet block under fluoroscopic guidance and IV sedation. With regards to her neck pain and cervical radiculitis, should the pain return, we will repeat the injection. For the time being she is 100% free of back pain and therefore I'll see the need of doing the injection at this point.  Further details on both, my assessment(s), as well as the proposed treatment plan, please see below.  Post-Procedure Assessment  07/28/2017 Procedure: Diagnostic left cervical epidural steroid #1 injection under fluoroscopic guidance and IV sedation Pre-procedure pain score:  5/10 Post-procedure pain score: 0/10 (100% relief) Influential Factors: BMI: 45.73 kg/m Intra-procedural challenges: None observed.         Assessment challenges: None detected.  Reported side-effects: None.        Post-procedural adverse  reactions or complications: None reported         Sedation: Sedation provided. When no sedatives are used, the analgesic levels obtained are directly associated to the effectiveness of the local anesthetics. However, when sedation is provided, the level of analgesia obtained during the initial 1 hour following the intervention, is believed to be the result of a combination of factors. These factors may include, but are not limited to: 1. The effectiveness of the local anesthetics used. 2. The effects of the analgesic(s) and/or anxiolytic(s) used. 3. The degree of discomfort experienced by the patient at the time of the procedure. 4. The patients ability and reliability in recalling and recording the events. 5. The presence and influence of possible secondary gains and/or psychosocial factors. Reported result: Relief experienced during the 1st hour after the procedure: 100 % (Ultra-Short Term Relief) Ms. Fontanilla has indicated area to have been numb during this time. Interpretative annotation: Clinically appropriate result. Analgesia during this period is likely to be Local Anesthetic and/or IV Sedative (Analgesic/Anxiolytic) related.          Effects of local anesthetic: The analgesic effects attained during this period are directly associated to the localized infiltration of local anesthetics and therefore cary significant diagnostic value as to the etiological location, or anatomical origin, of the pain. Expected duration of relief is directly dependent on the pharmacodynamics of the local anesthetic used. Long-acting (4-6 hours) anesthetics used.  Reported result: Relief during the next 4 to 6 hour after the procedure: 100 % (Short-Term Relief) Morgan Patel has indicated area to have been numb during this time. Interpretative annotation: Clinically appropriate result. Analgesia during this period is likely to be Local Anesthetic-related.          Long-term benefit: Defined as the period of time past  the expected duration of local anesthetics (1 hour for short-acting and 4-6 hours for long-acting). With the possible exception of prolonged sympathetic blockade from the local anesthetics, benefits during this period are typically attributed to, or associated with, other factors such as analgesic sensory neuropraxia, antiinflammatory effects, or beneficial biochemical changes provided by agents other than the local anesthetics.  Reported result: Extended relief following procedure: 100 %(on going) (Long-Term Relief) Morgan Patel reports that both, extremity and the axial pain improved with the treatment. Interpretative annotation: Clinically appropriate result. Good relief. No permanent benefit expected. Inflammation plays a part in the etiology to the pain.          Current benefits: Defined as reported results that persistent at this point in time.   Analgesia: 100 % Morgan Patel reports that both, extremity and the axial pain improved with the treatment. Function: Morgan Patel reports improvement in function ROM: Morgan Patel reports improvement in ROM Interpretative annotation: Recurrence of symptoms. No permanent benefit expected. Effective therapeutic approach. Benefit could be steroid-related.  Interpretation: Results would suggest a successful diagnostic intervention.                  Plan:  Set up procedure as a PRN palliative treatment option for this patient.        Laboratory Chemistry  Inflammation Markers (CRP: Acute Phase) (ESR: Chronic Phase) Lab Results  Component Value Date   CRP 0.4 05/28/2017   ESRSEDRATE 2 05/28/2017                 Rheumatology Markers No results found for: RF, ANA, LABURIC, URICUR, LYMEIGGIGMAB, LYMEABIGMQN  Renal Function Markers Lab Results  Component Value Date   BUN 14 05/28/2017   CREATININE 0.60 05/28/2017   GFRAA 126 05/28/2017   GFRNONAA 109 05/28/2017                 Hepatic Function Markers Lab Results  Component Value  Date   AST 25 05/28/2017   ALT 21 05/28/2017   ALBUMIN 4.2 05/28/2017   ALKPHOS 107 05/28/2017                 Electrolytes Lab Results  Component Value Date   NA 144 05/28/2017   K 4.6 05/28/2017   CL 107 (H) 05/28/2017   CALCIUM 10.0 05/28/2017   MG 1.8 05/28/2017                 Neuropathy Markers Lab Results  Component Value Date   VITAMINB12 700 05/28/2017                 Bone Pathology Markers Lab Results  Component Value Date   25OHVITD1 14 (L) 05/28/2017   25OHVITD2 <1.0 05/28/2017   25OHVITD3 13 05/28/2017                 Coagulation Parameters Lab Results  Component Value Date   INR 1.0 06/15/2014   LABPROT 12.8 06/15/2014   APTT 25.1 06/15/2014   PLT 281 09/20/2014                 Cardiovascular Markers Lab Results  Component Value Date   BNP 16 06/15/2014   CKMB 1.5 06/16/2014   TROPONINI < 0.02 09/20/2014   HGB 13.0 09/20/2014   HCT 39.9 09/20/2014                 CA Markers No results found for: CEA, CA125, LABCA2               Note: Lab results reviewed.  Recent Diagnostic Imaging Results  DG C-Arm 1-60 Min-No Report Fluoroscopy was utilized by the requesting physician.  No radiographic  interpretation.   Complexity Note: Imaging results reviewed. Results shared with Morgan Patel, using Layman's terms.                         Meds   Current Outpatient Medications:  .  aspirin EC 81 MG tablet, Take 81 mg by mouth daily., Disp: , Rfl:  .  atorvastatin (LIPITOR) 80 MG tablet, Take 80 mg by mouth daily., Disp: , Rfl:  .  cyclobenzaprine (FLEXERIL) 10 MG tablet, Take 10 mg by mouth at bedtime as needed., Disp: , Rfl:  .  DULoxetine (CYMBALTA) 20 MG capsule, Take 20 mg by mouth., Disp: , Rfl:  .  ergocalciferol (VITAMIN D2) 50000 units capsule, Take 1 capsule (50,000 Units total) by mouth once a week. X 12 weeks., Disp: 12 capsule, Rfl: 0 .  glipiZIDE (GLUCOTROL) 10 MG tablet, Take 10 mg by mouth 2 (two) times daily before a meal., Disp:  , Rfl:  .  glucose blood (FREESTYLE TEST STRIPS) test strip, 1 strip as needed., Disp: , Rfl:  .  hydrALAZINE (APRESOLINE) 100 MG tablet, Take 100 mg by mouth 3 (three) times daily., Disp: , Rfl:  .  hydrOXYzine (ATARAX/VISTARIL) 25 MG tablet, Take 1 tablet by mouth daily., Disp: , Rfl:  .  ibuprofen (ADVIL,MOTRIN) 600 MG tablet, Take 600 mg by mouth daily., Disp: , Rfl:  .  metFORMIN (GLUCOPHAGE) 500 MG tablet, Take  500 mg by mouth 2 (two) times daily with a meal., Disp: , Rfl:  .  nitroGLYCERIN (NITROSTAT) 0.4 MG SL tablet, Place 0.4 mg under the tongue as needed., Disp: , Rfl:  .  pregabalin (LYRICA) 150 MG capsule, Take 150 mg by mouth 2 (two) times daily., Disp: , Rfl:  .  verapamil (CALAN-SR) 240 MG CR tablet, Take 240 mg by mouth 2 (two) times daily., Disp: , Rfl:  .  fluticasone (FLONASE) 50 MCG/ACT nasal spray, Place 1 spray into both nostrils daily., Disp: , Rfl:  .  traMADol (ULTRAM) 50 MG tablet, Take 1 tablet (50 mg total) by mouth every 6 (six) hours as needed for severe pain., Disp: 120 tablet, Rfl: 0  ROS  Constitutional: Denies any fever or chills Gastrointestinal: No reported hemesis, hematochezia, vomiting, or acute GI distress Musculoskeletal: Denies any acute onset joint swelling, redness, loss of ROM, or weakness Neurological: No reported episodes of acute onset apraxia, aphasia, dysarthria, agnosia, amnesia, paralysis, loss of coordination, or loss of consciousness  Allergies  Morgan Patel is allergic to amlodipine; quinapril; hydrochlorothiazide; atenolol; and penicillins.  [place]  Drug: Morgan Patel  reports that she does not use drugs. Alcohol:  reports that she does not drink alcohol. Tobacco:  reports that she has been smoking cigarettes.  She has been smoking about 0.50 packs per day. she has never used smokeless tobacco. Medical:  has a past medical history of Abscess of breast, right (02/04/2015), DDD (degenerative disc disease), lumbar, Diabetes mellitus without  complication ([place]), Hyperlipidemia, Hypertension, MI (myocardial infarction) ([place]) (2015), and Stroke ([place]) (2017). Surgical: Morgan Patel  has no past surgical history on file. Family: family history includes Cancer in her brother; Diabetes in her mother; Hypertension in her father and mother; Osteoarthritis in her father and mother; Stroke in her father.  Constitutional Exam  General appearance: Well nourished, well developed, and well hydrated. In no apparent acute distress Vitals:   08/26/17 1354  BP: (!) 148/99  Pulse: 87  Temp: 99 F (37.2 C)  SpO2: 96%  Weight: 250 lb (113.4 kg)  Height: _0  (1.575 m)   BMI Assessment: Estimated body mass index is 45.73 kg/m as calculated from the following:   Height as of this encounter: _1  (1.575 m).   Weight as of this encounter: 250 lb (113.4 kg).  BMI interpretation table: BMI level Category Range association with higher incidence of chronic pain  <18 kg/m2 Underweight   18.5-24.9 kg/m2 Ideal body weight   25-29.9 kg/m2 Overweight Increased incidence by 20%  30-34.9 kg/m2 Obese (Class I) Increased incidence by 68%  35-39.9 kg/m2 Severe obesity (Class II) Increased incidence by 136%  >40 kg/m2 Extreme obesity (Class III) Increased incidence by 254%   BMI Readings from Last 4 Encounters:  08/26/17 45.73 kg/m  07/28/17 47.19 kg/m  06/22/17 47.48 kg/m  05/28/17 43.91 kg/m   Wt Readings from Last 4 Encounters:  08/26/17 250 lb (113.4 kg)  07/28/17 258 lb (117 kg)  06/22/17 259 lb 9.6 oz (117.8 kg)  05/28/17 255 lb 12.8 oz (116 kg)  Psych/Mental status: Alert, oriented x 3 (person, place, & time)       Eyes: PERLA Respiratory: No evidence of acute respiratory distress  Cervical Spine Area Exam  Skin & Axial Inspection: No masses, redness, edema, swelling, or associated skin lesions Alignment: Symmetrical Functional ROM: Improved after treatment      Stability: No instability detected Muscle Tone/Strength: Functionally  intact. No obvious neuro-muscular anomalies detected. Sensory (  Neurological): Unimpaired Palpation: No palpable anomalies              Upper Extremity (UE) Exam    Side: Right upper extremity  Side: Left upper extremity  Skin & Extremity Inspection: Skin color, temperature, and hair growth are WNL. No peripheral edema or cyanosis. No masses, redness, swelling, asymmetry, or associated skin lesions. No contractures.  Skin & Extremity Inspection: Skin color, temperature, and hair growth are WNL. No peripheral edema or cyanosis. No masses, redness, swelling, asymmetry, or associated skin lesions. No contractures.  Functional ROM: Unrestricted ROM          Functional ROM: Unrestricted ROM          Muscle Tone/Strength: Functionally intact. No obvious neuro-muscular anomalies detected.  Muscle Tone/Strength: Functionally intact. No obvious neuro-muscular anomalies detected.  Sensory (Neurological): Unimpaired          Sensory (Neurological): Unimpaired          Palpation: No palpable anomalies              Palpation: No palpable anomalies              Specialized Test(s): Deferred         Specialized Test(s): Deferred          Thoracic Spine Area Exam  Skin & Axial Inspection: No masses, redness, or swelling Alignment: Symmetrical Functional ROM: Unrestricted ROM Stability: No instability detected Muscle Tone/Strength: Functionally intact. No obvious neuro-muscular anomalies detected. Sensory (Neurological): Unimpaired Muscle strength & Tone: No palpable anomalies  Lumbar Spine Area Exam  Skin & Axial Inspection: No masses, redness, or swelling Alignment: Symmetrical Functional ROM: Decreased ROM      Stability: No instability detected Muscle Tone/Strength: Functionally intact. No obvious neuro-muscular anomalies detected. Sensory (Neurological): Movement-associated pain Palpation: Complains of area being tender to palpation       Provocative Tests: Lumbar Hyperextension and rotation test:  Positive bilaterally for facet joint pain. Lumbar Lateral bending test: evaluation deferred today       Patrick's Maneuver: evaluation deferred today                    Gait & Posture Assessment  Ambulation: Unassisted Gait: Relatively normal for age and body habitus Posture: WNL   Lower Extremity Exam    Side: Right lower extremity  Side: Left lower extremity  Skin & Extremity Inspection: Skin color, temperature, and hair growth are WNL. No peripheral edema or cyanosis. No masses, redness, swelling, asymmetry, or associated skin lesions. No contractures.  Skin & Extremity Inspection: Skin color, temperature, and hair growth are WNL. No peripheral edema or cyanosis. No masses, redness, swelling, asymmetry, or associated skin lesions. No contractures.  Functional ROM: Unrestricted ROM          Functional ROM: Unrestricted ROM          Muscle Tone/Strength: Functionally intact. No obvious neuro-muscular anomalies detected.  Muscle Tone/Strength: Functionally intact. No obvious neuro-muscular anomalies detected.  Sensory (Neurological): Unimpaired  Sensory (Neurological): Unimpaired  Palpation: No palpable anomalies  Palpation: No palpable anomalies   Assessment  Primary Diagnosis & Pertinent Problem List: The primary encounter diagnosis was Chronic low back pain (Second Area of Pain) (Bilateral) (L>R). Diagnoses of Lumbar facet syndrome (Bilateral) (L>R), Lumbar facet arthropathy (Bilateral), DDD (degenerative disc disease), lumbar, Chronic cervical radicular pain (Bilateral), Chronic neck pain (Primary Area of Pain) (Bilateral) (L>R), and Cervical spondylosis were also pertinent to this visit.  Status Diagnosis  Controlled Controlled Controlled 1.  Chronic low back pain (Second Area of Pain) (Bilateral) (L>R)   2. Lumbar facet syndrome (Bilateral) (L>R)   3. Lumbar facet arthropathy (Bilateral)   4. DDD (degenerative disc disease), lumbar   5. Chronic cervical radicular pain (Bilateral)    6. Chronic neck pain (Primary Area of Pain) (Bilateral) (L>R)   7. Cervical spondylosis     Problems updated and reviewed during this visit: No problems updated. Plan of Care  Pharmacotherapy (Medications Ordered): No orders of the defined types were placed in this encounter. This SmartLink is deprecated. Use AVSMEDLIST instead to display the medication list for a patient. Medications administered today: Morgan Patel had no medications administered during this visit.   Procedure Orders     LUMBAR FACET(MEDIAL BRANCH NERVE BLOCK) MBNB     Cervical Epidural Injection Lab Orders  No laboratory test(s) ordered today   Imaging Orders  No imaging studies ordered today   Referral Orders  No referral(s) requested today    Interventional management options: Planned, scheduled, and/or pending:   Diagnostic bilateral lumbar facet block #1 under fluoroscopic guidance and IV sedation    Considering:   Diagnosticleft sided cervical epidural steroid injection Diagnosticleft-sided cervical facet block Diagnostic Left sided Cervical radiofrequency ablation Diagnostic left-sided thoracic facet block Diagnostic bilateral lumbar epidural steroid injection Diagnostic bilateral lumbar facet  Possible bilateral radiofrequency ablation   Palliative PRN treatment(s):   Palliative left cervical epidural steroid injection #2 under fluoroscopic guidance and IV sedation    Provider-requested follow-up: Return for Procedure (w/ sedation): (B) L-FCT BLK.  No future appointments. Primary Care Physician: Inge Rise, NP Location: [facility] Note by: Gaspar Cola, MD Date: 08/26/2017; Time: 1:38 PM

## 2017-09-02 NOTE — Telephone Encounter (Signed)
error 

## 2017-09-08 ENCOUNTER — Encounter: Payer: Self-pay | Admitting: Pain Medicine

## 2017-09-08 ENCOUNTER — Ambulatory Visit: Payer: Medicaid Other | Admitting: Pain Medicine

## 2017-09-08 ENCOUNTER — Other Ambulatory Visit: Payer: Self-pay

## 2017-09-08 ENCOUNTER — Ambulatory Visit
Admission: RE | Admit: 2017-09-08 | Discharge: 2017-09-08 | Disposition: A | Discharge status: Home / Self Care | Payer: Medicaid Other | Source: Ambulatory Visit | Attending: Pain Medicine | Admitting: Pain Medicine

## 2017-09-08 VITALS — BP 100/83 | HR 88 | Temp 98.6°F | Resp 20 | Ht 62.0 in | Wt 230.0 lb

## 2017-09-08 DIAGNOSIS — M47816 Spondylosis without myelopathy or radiculopathy, lumbar region: Secondary | ICD-10-CM

## 2017-09-08 DIAGNOSIS — M5441 Lumbago with sciatica, right side: Secondary | ICD-10-CM

## 2017-09-08 DIAGNOSIS — Z880 Allergy status to penicillin: Secondary | ICD-10-CM | POA: Insufficient documentation

## 2017-09-08 DIAGNOSIS — Z888 Allergy status to other drugs, medicaments and biological substances status: Secondary | ICD-10-CM | POA: Diagnosis not present

## 2017-09-08 DIAGNOSIS — M545 Low back pain: Secondary | ICD-10-CM | POA: Diagnosis present

## 2017-09-08 DIAGNOSIS — Z7951 Long term (current) use of inhaled steroids: Secondary | ICD-10-CM | POA: Diagnosis not present

## 2017-09-08 DIAGNOSIS — G8929 Other chronic pain: Secondary | ICD-10-CM | POA: Diagnosis not present

## 2017-09-08 DIAGNOSIS — Z7982 Long term (current) use of aspirin: Secondary | ICD-10-CM | POA: Diagnosis not present

## 2017-09-08 DIAGNOSIS — Z7984 Long term (current) use of oral hypoglycemic drugs: Secondary | ICD-10-CM | POA: Diagnosis not present

## 2017-09-08 DIAGNOSIS — Z79899 Other long term (current) drug therapy: Secondary | ICD-10-CM | POA: Insufficient documentation

## 2017-09-08 DIAGNOSIS — M488X6 Other specified spondylopathies, lumbar region: Secondary | ICD-10-CM | POA: Insufficient documentation

## 2017-09-08 DIAGNOSIS — M1288 Other specific arthropathies, not elsewhere classified, other specified site: Secondary | ICD-10-CM | POA: Insufficient documentation

## 2017-09-08 DIAGNOSIS — M5442 Lumbago with sciatica, left side: Secondary | ICD-10-CM

## 2017-09-08 MED ORDER — ROPIVACAINE HCL 2 MG/ML IJ SOLN
9.0000 mL | Freq: Once | INTRAMUSCULAR | Status: AC
  Administered 2017-09-08: 9 mL via PERINEURAL

## 2017-09-08 MED ORDER — ROPIVACAINE HCL 2 MG/ML IJ SOLN
INTRAMUSCULAR | Status: AC
  Filled 2017-09-08: qty 20

## 2017-09-08 MED ORDER — TRIAMCINOLONE ACETONIDE 40 MG/ML IJ SUSP
40.0000 mg | Freq: Once | INTRAMUSCULAR | Status: AC
  Administered 2017-09-08: 40 mg

## 2017-09-08 MED ORDER — LIDOCAINE HCL 2 % IJ SOLN
10.0000 mL | Freq: Once | INTRAMUSCULAR | Status: AC
  Administered 2017-09-08: 400 mg

## 2017-09-08 MED ORDER — TRIAMCINOLONE ACETONIDE 40 MG/ML IJ SUSP
INTRAMUSCULAR | Status: AC
  Filled 2017-09-08: qty 2

## 2017-09-08 MED ORDER — LIDOCAINE HCL 2 % IJ SOLN
INTRAMUSCULAR | Status: AC
  Filled 2017-09-08: qty 20

## 2017-09-08 NOTE — Progress Notes (Signed)
Patient's Name: Morgan Patel  MRN: [id number]  Referring Provider: Steve Rattler, NP  DOB: 07-Jan-1971  PCP: Steve Rattler, NP  DOS: 09/08/2017  Note by: Oswaldo Done, MD  Service setting: Ambulatory outpatient  Specialty: Interventional Pain Management  Patient type: Established  Location: [facility] (AMB) Pain Management Facility  Visit type: Interventional Procedure   Primary Reason for Visit: Interventional Pain Management Treatment. CC: Back Pain (low)  Procedure:  Anesthesia, Analgesia, Anxiolysis:  Type: Diagnostic Medial Branch Facet Block Region: Lumbar Level: L2, L3, L4, L5, & S1 Medial Branch Level(s) Laterality: Bilateral  Type: Local Anesthesia Local Anesthetic: Lidocaine 1% Route: Infiltration ([place]/IM) IV Access: Declined Sedation: Declined  Indication(s): Analgesia           Indications: 1. Lumbar facet syndrome (Bilateral) (L>R)   2. Lumbar facet arthropathy (Bilateral)   3. Chronic low back pain (Second Area of Pain) (Bilateral) (L>R)    Pain Score: Pre-procedure: 6 /10 Post-procedure: 0-No pain/10  Pre-op Assessment:  Morgan Patel is a 46 y.o. (year old), female patient, seen today for interventional treatment. She  has no past surgical history on file. Morgan Patel has a current medication list which includes the following prescription(s): aspirin ec, atorvastatin, cyclobenzaprine, duloxetine, glipizide, glucose blood, hydralazine, ibuprofen, metformin, nitroglycerin, pregabalin, verapamil, fluticasone, and tramadol. Her primarily concern today is the Back Pain (low)  Initial Vital Signs: There were no vitals taken for this visit. BMI: Estimated body mass index is 42.07 kg/m as calculated from the following:   Height as of this encounter: 5\' 2"  (1.575 m).   Weight as of this encounter: 230 lb (104.3 kg).  Risk Assessment: Allergies: Reviewed. She is allergic to amlodipine; quinapril; hydrochlorothiazide; atenolol; and penicillins.  Allergy Precautions:  None required Coagulopathies: Reviewed. None identified.  Blood-thinner therapy: None at this time Active Infection(s): Reviewed. None identified. Morgan Patel is afebrile  Site Confirmation: Morgan Patel was asked to confirm the procedure and laterality before marking the site Procedure checklist: Completed Consent: Before the procedure and under the influence of no sedative(s), amnesic(s), or anxiolytics, the patient was informed of the treatment options, risks and possible complications. To fulfill our ethical and legal obligations, as recommended by the American Medical Association's Code of Ethics, I have informed the patient of my clinical impression; the nature and purpose of the treatment or procedure; the risks, benefits, and possible complications of the intervention; the alternatives, including doing nothing; the risk(s) and benefit(s) of the alternative treatment(s) or procedure(s); and the risk(s) and benefit(s) of doing nothing. The patient was provided information about the general risks and possible complications associated with the procedure. These may include, but are not limited to: failure to achieve desired goals, infection, bleeding, organ or nerve damage, allergic reactions, paralysis, and death. In addition, the patient was informed of those risks and complications associated to Spine-related procedures, such as failure to decrease pain; infection (i.e.: Meningitis, epidural or intraspinal abscess); bleeding (i.e.: epidural hematoma, subarachnoid hemorrhage, or any other type of intraspinal or peri-dural bleeding); organ or nerve damage (i.e.: Any type of peripheral nerve, nerve root, or spinal cord injury) with subsequent damage to sensory, motor, and/or autonomic systems, resulting in permanent pain, numbness, and/or weakness of one or several areas of the body; allergic reactions; (i.e.: anaphylactic reaction); and/or death. Furthermore, the patient was informed of those risks and  complications associated with the medications. These include, but are not limited to: allergic reactions (i.e.: anaphylactic or anaphylactoid reaction(s)); adrenal axis suppression; blood sugar elevation  that in diabetics may result in ketoacidosis or comma; water retention that in patients with history of congestive heart failure may result in shortness of breath, pulmonary edema, and decompensation with resultant heart failure; weight gain; swelling or edema; medication-induced neural toxicity; particulate matter embolism and blood vessel occlusion with resultant organ, and/or nervous system infarction; and/or aseptic necrosis of one or more joints. Finally, the patient was informed that Medicine is not an exact science; therefore, there is also the possibility of unforeseen or unpredictable risks and/or possible complications that may result in a catastrophic outcome. The patient indicated having understood very clearly. We have given the patient no guarantees and we have made no promises. Enough time was given to the patient to ask questions, all of which were answered to the patient's satisfaction. Morgan Patel has indicated that she wanted to continue with the procedure. Attestation: I, the ordering provider, attest that I have discussed with the patient the benefits, risks, side-effects, alternatives, likelihood of achieving goals, and potential problems during recovery for the procedure that I have provided informed consent. Date: 09/08/2017; Time: 12:13 PM  Pre-Procedure Preparation:  Monitoring: As per clinic protocol. Respiration, ETCO2, SpO2, BP, heart rate and rhythm monitor placed and checked for adequate function Safety Precautions: Patient was assessed for positional comfort and pressure points before starting the procedure. Time-out: I initiated and conducted the "Time-out" before starting the procedure, as per protocol. The patient was asked to participate by confirming the accuracy of the  "Time Out" information. Verification of the correct person, site, and procedure were performed and confirmed by me, the nursing staff, and the patient. "Time-out" conducted as per Joint Commission's Universal Protocol (UP.01.01.01). "Time-out" Date & Time: 09/08/2017; 1501 hrs.  Description of Procedure Process:   Position: Prone Target Area: For Lumbar Facet blocks, the target is the groove formed by the junction of the transverse process and superior articular process. For the L5 dorsal ramus, the target is the notch between superior articular process and sacral ala. For the S1 dorsal ramus, the target is the superior and lateral edge of the posterior S1 Sacral foramen. Approach: Paramedial approach. Area Prepped: Entire Posterior Lumbosacral Region Prepping solution: ChloraPrep (2% chlorhexidine gluconate and 70% isopropyl alcohol) Safety Precautions: Aspiration looking for blood return was conducted prior to all injections. At no point did we inject any substances, as a needle was being advanced. No attempts were made at seeking any paresthesias. Safe injection practices and needle disposal techniques used. Medications properly checked for expiration dates. SDV (single dose vial) medications used. Description of the Procedure: Protocol guidelines were followed. The patient was placed in position over the fluoroscopy table. The target area was identified and the area prepped in the usual manner. Skin desensitized using vapocoolant spray. Skin & deeper tissues infiltrated with local anesthetic. Appropriate amount of time allowed to pass for local anesthetics to take effect. The procedure needle was introduced through the skin, ipsilateral to the reported pain, and advanced to the target area. Employing the "Medial Branch Technique", the needles were advanced to the angle made by the superior and medial portion of the transverse process, and the lateral and inferior portion of the superior articulating  process of the targeted vertebral bodies. This area is known as "Burton's Eye" or the "Eye of the [place] Dog". A procedure needle was introduced through the skin, and this time advanced to the angle made by the superior and medial border of the sacral ala, and the lateral border of the S1 vertebral  body. This last needle was later repositioned at the superior and lateral border of the posterior S1 foramen. Negative aspiration confirmed. Solution injected in intermittent fashion, asking for systemic symptoms every 0.5cc of injectate. The needles were then removed and the area cleansed, making sure to leave some of the prepping solution back to take advantage of its long term bactericidal properties.   Illustration of the posterior view of the lumbar spine and the posterior neural structures. Laminae of L2 through S1 are labeled. DPRL5, dorsal primary ramus of L5; DPRS1, dorsal primary ramus of S1; DPR3, dorsal primary ramus of L3; FJ, facet (zygapophyseal) joint L3-L4; I, inferior articular process of L4; LB1, lateral branch of dorsal primary ramus of L1; IAB, inferior articular branches from L3 medial branch (supplies L4-L5 facet joint); IBP, intermediate branch plexus; MB3, medial branch of dorsal primary ramus of L3; NR3, third lumbar nerve root; S, superior articular process of L5; SAB, superior articular branches from L4 (supplies L4-5 facet joint also); TP3, transverse process of L3.  Vitals:   09/08/17 1449 09/08/17 1500 09/08/17 1505 09/08/17 1514  BP: 139/79 140/89 (!) 125/102 100/83  Pulse: 90 91 83 88  Resp: 20 (!) 21 (!) 23 20  Temp:      SpO2: 94% 92% 94% 94%  Weight:      Height:        Start Time: 1501 hrs. End Time: 1511 hrs. Materials:  Needle(s) Type: Regular needle Gauge: 22G Length: 3.5-in Medication(s): We administered lidocaine, triamcinolone acetonide, ropivacaine (PF) 2 mg/mL (0.2%), ropivacaine (PF) 2 mg/mL (0.2%), and triamcinolone acetonide. Please see chart orders  for dosing details.  Imaging Guidance (Spinal):  Type of Imaging Technique: Fluoroscopy Guidance (Spinal) Indication(s): Assistance in needle guidance and placement for procedures requiring needle placement in or near specific anatomical locations not easily accessible without such assistance. Exposure Time: Please see nurses notes. Contrast: None used. Fluoroscopic Guidance: I was personally present during the use of fluoroscopy. "Tunnel Vision Technique" used to obtain the best possible view of the target area. Parallax error corrected before commencing the procedure. "Direction-depth-direction" technique used to introduce the needle under continuous pulsed fluoroscopy. Once target was reached, antero-posterior, oblique, and lateral fluoroscopic projection used confirm needle placement in all planes. Images permanently stored in EMR. Interpretation: No contrast injected. I personally interpreted the imaging intraoperatively. Adequate needle placement confirmed in multiple planes. Permanent images saved into the patient's record.  Antibiotic Prophylaxis:  Indication(s): None identified Antibiotic given: None  Post-operative Assessment:  EBL: None Complications: No immediate post-treatment complications observed by team, or reported by patient. Note: The patient tolerated the entire procedure well. A repeat set of vitals were taken after the procedure and the patient was kept under observation following institutional policy, for this type of procedure. Post-procedural neurological assessment was performed, showing return to baseline, prior to discharge. The patient was provided with post-procedure discharge instructions, including a section on how to identify potential problems. Should any problems arise concerning this procedure, the patient was given instructions to immediately contact us, at any time, without hesitation. In any case, we plan to contact the patient by telephone for a follow-up  status report regarding this interventional procedure. Comments:  No additional relevant information.  Plan of Care   Imaging Orders     DG C-Arm 1-60 Min-No Report  Procedure Orders     LUMBAR FACET(MEDIAL BRANCH NERVE BLOCK) MBNB  Medications ordered for procedure: Meds ordered this encounter  Medications  . lidocaine (XYLOCAINE) 2 % (with  pres) injection 200 mg  . triamcinolone acetonide (KENALOG-40) injection 40 mg  . ropivacaine (PF) 2 mg/mL (0.2%) (NAROPIN) injection 9 mL  . ropivacaine (PF) 2 mg/mL (0.2%) (NAROPIN) injection 9 mL  . triamcinolone acetonide (KENALOG-40) injection 40 mg   Medications administered: We administered lidocaine, triamcinolone acetonide, ropivacaine (PF) 2 mg/mL (0.2%), ropivacaine (PF) 2 mg/mL (0.2%), and triamcinolone acetonide.  See the medical record for exact dosing, route, and time of administration.  This SmartLink is deprecated. Use AVSMEDLIST instead to display the medication list for a patient. Disposition: Discharge home  Discharge Date & Time: 09/08/2017; 1520 hrs.   Physician-requested Follow-up: Return for post-procedure eval (2 wks). Future Appointments  Date Time Provider Department Center  10/07/2017  2:00 PM Delano Metz, MD [facility] None   Primary Care Physician: Steve Rattler, NP Location: [facility] Note by: Oswaldo Done, MD Date: 09/08/2017; Time: 3:37 PM  Disclaimer:  Medicine is not an Visual merchandiser. The only guarantee in medicine is that nothing is guaranteed. It is important to note that the decision to proceed with this intervention was based on the information collected from the patient. The Data and conclusions were drawn from the patient's questionnaire, the interview, and the physical examination. Because the information was provided in large part by the patient, it cannot be guaranteed that it has not been purposely or unconsciously manipulated. Every effort has been  made to obtain as much relevant data as possible for this evaluation. It is important to note that the conclusions that lead to this procedure are derived in large part from the available data. Always take into account that the treatment will also be dependent on availability of resources and existing treatment guidelines, considered by other Pain Management Practitioners as being common knowledge and practice, at the time of the intervention. For Medico-Legal purposes, it is also important to point out that variation in procedural techniques and pharmacological choices are the acceptable norm. The indications, contraindications, technique, and results of the above procedure should only be interpreted and judged by a Board-Certified Interventional Pain Specialist with extensive familiarity and expertise in the same exact procedure and technique.

## 2017-09-08 NOTE — Progress Notes (Signed)
Safety precautions to be maintained throughout the outpatient stay will include: orient to surroundings, keep bed in low position, maintain call bell within reach at all times, provide assistance with transfer out of bed and ambulation.  

## 2017-09-08 NOTE — Patient Instructions (Addendum)
____________________________________________________________________________________________  Post-Procedure instructions Instructions:  Apply ice: Fill a plastic sandwich bag with crushed ice. Cover it with a small towel and apply to injection site. Apply for 15 minutes then remove x 15 minutes. Repeat sequence on day of procedure, until you go to bed. The purpose is to minimize swelling and discomfort after procedure.  Apply heat: Apply heat to procedure site starting the day following the procedure. The purpose is to treat any soreness and discomfort from the procedure.  Food intake: Start with clear liquids (like water) and advance to regular food, as tolerated.   Physical activities: Keep activities to a minimum for the first 8 hours after the procedure.   Driving: If you have received any sedation, you are not allowed to drive for 24 hours after your procedure.  Blood thinner: Restart your blood thinner 6 hours after your procedure. (Only for those taking blood thinners)  Insulin: As soon as you can eat, you may resume your normal dosing schedule. (Only for those taking insulin)  Infection prevention: Keep procedure site clean and dry.  Post-procedure Pain Diary: Extremely important that this be done correctly and accurately. Recorded information will be used to determine the next step in treatment.  Pain evaluated is that of treated area only. Do not include pain from an untreated area.  Complete every hour, on the hour, for the initial 8 hours. Set an alarm to help you do this part accurately.  Do not go to sleep and have it completed later. It will not be accurate.  Follow-up appointment: Keep your follow-up appointment after the procedure. Usually 2 weeks for most procedures. (6 weeks in the case of radiofrequency.) Bring you pain diary.  Expect:  From numbing medicine (AKA: Local Anesthetics): Numbness or decrease in pain.  Onset: Full effect within 15 minutes of  injected.  Duration: It will depend on the type of local anesthetic used. On the average, 1 to 8 hours.   From steroids: Decrease in swelling or inflammation. Once inflammation is improved, relief of the pain will follow.  Onset of benefits: Depends on the amount of swelling present. The more swelling, the longer it will take for the benefits to be seen. In some cases, up to 10 days.  Duration: Steroids will stay in the system x 2 weeks. Duration of benefits will depend on multiple posibilities including persistent irritating factors.  From procedure: Some discomfort is to be expected once the numbing medicine wears off. This should be minimal if ice and heat are applied as instructed. Call if:  You experience numbness and weakness that gets worse with time, as opposed to wearing off.  New onset bowel or bladder incontinence. (Spinal procedures only)  Emergency Numbers:  Durning business hours (Monday - Thursday, 8:00 AM - 4:00 PM) (Friday, 9:00 AM - 12:00 Noon): [phone]  After hours: [phone] ____________________________________________________________________________________________   Pain Management Discharge Instructions  General Discharge Instructions :  If you need to reach your doctor call: Monday-Friday 8:00 am - 4:00 pm at [phone] or toll free [phone].  After clinic hours [phone] to have operator reach doctor.  Bring all of your medication bottles to all your appointments in the pain clinic.  To cancel or reschedule your appointment with Pain Management please remember to call 24 hours in advance to avoid a fee.  Refer to the educational materials which you have been given on: General Risks, I had my Procedure. Discharge Instructions, Post Sedation.  Post Procedure Instructions:  The drugs  you were given will stay in your system until tomorrow, so for the next 24 hours you should not drive, make any legal decisions or drink any alcoholic  beverages.  You may eat anything you prefer, but it is better to start with liquids then soups and crackers, and gradually work up to solid foods.  Please notify your doctor immediately if you have any unusual bleeding, trouble breathing or pain that is not related to your normal pain.  Depending on the type of procedure that was done, some parts of your body may feel week and/or numb.  This usually clears up by tonight or the next day.  Walk with the use of an assistive device or accompanied by an adult for the 24 hours.  You may use ice on the affected area for the first 24 hours.  Put ice in a Ziploc bag and cover with a towel and place against area 15 minutes on 15 minutes off.  You may switch to heat after 24 hours.Facet Blocks Patient Information  Description: The facets are joints in the spine between the vertebrae.  Like any joints in the body, facets can become irritated and painful.  Arthritis can also effect the facets.  By injecting steroids and local anesthetic in and around these joints, we can temporarily block the nerve supply to them.  Steroids act directly on irritated nerves and tissues to reduce selling and inflammation which often leads to decreased pain.  Facet blocks may be done anywhere along the spine from the neck to the low back depending upon the location of your pain.   After numbing the skin with local anesthetic (like Novocaine), a small needle is passed onto the facet joints under x-ray guidance.  You may experience a sensation of pressure while this is being done.  The entire block usually lasts about 15-25 minutes.   Conditions which may be treated by facet blocks:   Low back/buttock pain  Neck/shoulder pain  Certain types of headaches  Preparation for the injection:  1. Do not eat any solid food or dairy products within 8 hours of your appointment. 2. You may drink clear liquid up to 3 hours before appointment.  Clear liquids include water, black coffee,  juice or soda.  No milk or cream please. 3. You may take your regular medication, including pain medications, with a sip of water before your appointment.  Diabetics should hold regular insulin (if taken separately) and take 1/2 normal NPH dose the morning of the procedure.  Carry some sugar containing items with you to your appointment. 4. A driver must accompany you and be prepared to drive you home after your procedure. 5. Bring all your current medications with you. 6. An IV may be inserted and sedation may be given at the discretion of the physician. 7. A blood pressure cuff, EKG and other monitors will often be applied during the procedure.  Some patients may need to have extra oxygen administered for a short period. 8. You will be asked to provide medical information, including your allergies and medications, prior to the procedure.  We must know immediately if you are taking blood thinners (like Coumadin/Warfarin) or if you are allergic to IV iodine contrast (dye).  We must know if you could possible be pregnant.  Possible side-effects:   Bleeding from needle site  Infection (rare, may require surgery)  Nerve injury (rare)  Numbness & tingling (temporary)  Difficulty urinating (rare, temporary)  Spinal headache (a headache worse with  upright posture)  Light-headedness (temporary)  Pain at injection site (serveral days)  Decreased blood pressure (rare, temporary)  Weakness in arm/leg (temporary)  Pressure sensation in back/neck (temporary)   Call if you experience:   Fever/chills associated with headache or increased back/neck pain  Headache worsened by an upright position  New onset, weakness or numbness of an extremity below the injection site  Hives or difficulty breathing (go to the emergency room)  Inflammation or drainage at the injection site(s)  Severe back/neck pain greater than usual  New symptoms which are concerning to you  Please  note:  Although the local anesthetic injected can often make your back or neck feel good for several hours after the injection, the pain will likely return. It takes 3-7 days for steroids to work.  You may not notice any pain relief for at least one week.  If effective, we will often do a series of 2-3 injections spaced 3-6 weeks apart to maximally decrease your pain.  After the initial series, you may be a candidate for a more permanent nerve block of the facets.  If you have any questions, please call [phone] [facility]

## 2017-09-09 ENCOUNTER — Telehealth: Payer: Self-pay | Admitting: *Deleted

## 2017-09-09 NOTE — Telephone Encounter (Signed)
No problems post procedure. 

## 2017-10-07 ENCOUNTER — Ambulatory Visit: Payer: Medicaid Other | Admitting: Pain Medicine

## 2017-10-12 ENCOUNTER — Ambulatory Visit: Payer: Medicaid Other | Admitting: Pain Medicine

## 2017-10-25 NOTE — Progress Notes (Deleted)
Patient's Name: Morgan Patel  MRN: [id number]  Referring Provider: Inge Rise, NP  DOB: [birth year]  PCP: Morgan Rise, NP  DOS: 10/26/2017  Note by: Gaspar Cola, MD  Service setting: Ambulatory outpatient  Specialty: Interventional Pain Management  Location: [facility] (AMB) Pain Management Facility    Patient type: Established   Primary Reason(s) for Visit: Encounter for post-procedure evaluation of chronic illness with mild to moderate exacerbation CC: No chief complaint on file.  HPI  Morgan Patel is a 47 y.o. year old, female patient, who comes today for a post-procedure evaluation. She has Basilar artery stenosis; Coronary artery disease; DDD (degenerative disc disease), lumbar; Encounter for long-term (current) use of other high-risk medications; Encounter for tobacco use cessation counseling; Essential hypertension; Gastroesophageal reflux disease; Ischemic stroke ([place]); Lumbar facet arthropathy (Bilateral); Lumbosacral radiculopathy; Major depressive disorder with current active episode; Chronic myofascial pain; Nicotine dependence, uncomplicated; Other chronic pain; Tobacco use disorder; Type 2 diabetes mellitus with complication, without long-term current use of insulin ([place]); Visual field loss following stroke; Chronic neck pain (Primary Area of Pain) (Bilateral) (L>R); Chronic low back pain (Second Area of Pain) (Bilateral) (L>R); Chronic pain syndrome; Chronic sacroiliac joint pain (Bilateral) (L>R); Vitamin D deficiency; DDD (degenerative disc disease), cervical; Disorder of skeletal system; Pharmacologic therapy; Problems influencing health status; Chronic thoracic back pain (Bilateral) (L>R); Chronic upper extremity pain (Fourth Area of Pain) (Bilateral) (L>R); Lumbar facet syndrome (Bilateral) (L>R); Cervical spondylosis; Lumbar central spinal stenosis; Chronic lower extremity pain (Tertiary Area of Pain) (Bilateral) (L>R); Social fears; and Chronic cervical radicular pain  (Bilateral) on their problem list. Her primarily concern today is the No chief complaint on file.  Pain Assessment: Location:     Radiating:   Onset:   Duration:   Quality:   Severity:  /10 (self-reported pain score)  Note: Reported level is compatible with observation.                         When using our objective Pain Scale, levels between 6 and 10/10 are said to belong in an emergency room, as it progressively worsens from a 6/10, described as severely limiting, requiring emergency care not usually available at an outpatient pain management facility. At a 6/10 level, communication becomes difficult and requires great effort. Assistance to reach the emergency department may be required. Facial flushing and profuse sweating along with potentially dangerous increases in heart rate and blood pressure will be evident. Effect on ADL:   Timing:   Modifying factors:    Morgan Patel comes in today for post-procedure evaluation after the treatment done on 09/08/2017.  Further details on both, my assessment(s), as well as the proposed treatment plan, please see below.  Post-Procedure Assessment  09/08/2017 Procedure: Diagnostic bilateral lumbar facet block #1 under fluoroscopic guidance and no sedation  Pre-procedure pain score:  6/10 Post-procedure pain score: 0/10 (100% relief) Influential Factors: BMI:   Intra-procedural challenges: None observed.         Assessment challenges: None detected.              Reported side-effects: None.        Post-procedural adverse reactions or complications: None reported         Sedation: No sedation used. When no sedatives are used, the analgesic levels obtained are directly associated to the effectiveness of the local anesthetics. However, when sedation is provided, the level of analgesia obtained during the initial 1 hour following the intervention,  is believed to be the result of a combination of factors. These factors may include, but are not limited  to: 1. The effectiveness of the local anesthetics used. 2. The effects of the analgesic(s) and/or anxiolytic(s) used. 3. The degree of discomfort experienced by the patient at the time of the procedure. 4. The patients ability and reliability in recalling and recording the events. 5. The presence and influence of possible secondary gains and/or psychosocial factors. Reported result: Relief experienced during the 1st hour after the procedure:   (Ultra-Short Term Relief)            Interpretative annotation: Clinically appropriate result. No IV Analgesic or Anxiolytic given, therefore benefits are completely due to Local Anesthetic effects.          Effects of local anesthetic: The analgesic effects attained during this period are directly associated to the localized infiltration of local anesthetics and therefore cary significant diagnostic value as to the etiological location, or anatomical origin, of the pain. Expected duration of relief is directly dependent on the pharmacodynamics of the local anesthetic used. Long-acting (4-6 hours) anesthetics used.  Reported result: Relief during the next 4 to 6 hour after the procedure:   (Short-Term Relief)            Interpretative annotation: Clinically appropriate result. Analgesia during this period is likely to be Local Anesthetic-related.          Long-term benefit: Defined as the period of time past the expected duration of local anesthetics (1 hour for short-acting and 4-6 hours for long-acting). With the possible exception of prolonged sympathetic blockade from the local anesthetics, benefits during this period are typically attributed to, or associated with, other factors such as analgesic sensory neuropraxia, antiinflammatory effects, or beneficial biochemical changes provided by agents other than the local anesthetics.  Reported result: Extended relief following procedure:   (Long-Term Relief)            Interpretative annotation: Clinically  appropriate result. Good relief. No permanent benefit expected. Inflammation plays a part in the etiology to the pain.          Current benefits: Defined as reported results that persistent at this point in time.   Analgesia: *** %            Function: Somewhat improved ROM: Somewhat improved Interpretative annotation: Recurrence of symptoms. No permanent benefit expected. Effective diagnostic intervention.          Interpretation: Results would suggest a successful diagnostic intervention.                  Plan:  Please see "Plan of Care" for details.        Laboratory Chemistry  Inflammation Markers (CRP: Acute Phase) (ESR: Chronic Phase) Lab Results  Component Value Date   CRP 0.4 05/28/2017   ESRSEDRATE 2 05/28/2017                 Renal Function Markers Lab Results  Component Value Date   BUN 14 05/28/2017   CREATININE 0.60 05/28/2017   GFRAA 126 05/28/2017   GFRNONAA 109 05/28/2017                 Hepatic Function Markers Lab Results  Component Value Date   AST 25 05/28/2017   ALT 21 05/28/2017   ALBUMIN 4.2 05/28/2017   ALKPHOS 107 05/28/2017                 Electrolytes Lab Results  Component  Value Date   NA 144 05/28/2017   K 4.6 05/28/2017   CL 107 (H) 05/28/2017   CALCIUM 10.0 05/28/2017   MG 1.8 05/28/2017                 Neuropathy Markers Lab Results  Component Value Date   VITAMINB12 700 05/28/2017                 Bone Pathology Markers Lab Results  Component Value Date   25OHVITD1 14 (L) 05/28/2017   25OHVITD2 <1.0 05/28/2017   25OHVITD3 13 05/28/2017                 Coagulation Parameters Lab Results  Component Value Date   INR 1.0 06/15/2014   LABPROT 12.8 06/15/2014   APTT 25.1 06/15/2014   PLT 281 09/20/2014                 Cardiovascular Markers Lab Results  Component Value Date   BNP 16 06/15/2014   CKMB 1.5 06/16/2014   TROPONINI < 0.02 09/20/2014   HGB 13.0 09/20/2014   HCT 39.9 09/20/2014                 Note:  Lab results reviewed.  Recent Diagnostic Imaging Results  DG C-Arm 1-60 Min-No Report Fluoroscopy was utilized by the requesting physician.  No radiographic  interpretation.   Complexity Note: Imaging results reviewed. Results shared with Ms. Peplinski, using Layman's terms.                         Meds   Current Outpatient Medications:  .  aspirin EC 81 MG tablet, Take 81 mg by mouth daily., Disp: , Rfl:  .  atorvastatin (LIPITOR) 80 MG tablet, Take 80 mg by mouth daily., Disp: , Rfl:  .  cyclobenzaprine (FLEXERIL) 10 MG tablet, Take 10 mg by mouth at bedtime as needed., Disp: , Rfl:  .  DULoxetine (CYMBALTA) 20 MG capsule, Take 20 mg by mouth., Disp: , Rfl:  .  fluticasone (FLONASE) 50 MCG/ACT nasal spray, Place 1 spray into both nostrils daily., Disp: , Rfl:  .  glipiZIDE (GLUCOTROL) 10 MG tablet, Take 10 mg by mouth 2 (two) times daily before a meal., Disp: , Rfl:  .  glucose blood (FREESTYLE TEST STRIPS) test strip, 1 strip as needed., Disp: , Rfl:  .  hydrALAZINE (APRESOLINE) 100 MG tablet, Take 100 mg by mouth 3 (three) times daily., Disp: , Rfl:  .  ibuprofen (ADVIL,MOTRIN) 600 MG tablet, Take 600 mg by mouth daily., Disp: , Rfl:  .  metFORMIN (GLUCOPHAGE) 500 MG tablet, Take 500 mg by mouth 2 (two) times daily with a meal., Disp: , Rfl:  .  nitroGLYCERIN (NITROSTAT) 0.4 MG SL tablet, Place 0.4 mg under the tongue as needed., Disp: , Rfl:  .  pregabalin (LYRICA) 150 MG capsule, Take 150 mg by mouth 2 (two) times daily., Disp: , Rfl:  .  traMADol (ULTRAM) 50 MG tablet, Take 1 tablet (50 mg total) by mouth every 6 (six) hours as needed for severe pain., Disp: 120 tablet, Rfl: 0 .  verapamil (CALAN-SR) 240 MG CR tablet, Take 240 mg by mouth 2 (two) times daily., Disp: , Rfl:   ROS  Constitutional: Denies any fever or chills Gastrointestinal: No reported hemesis, hematochezia, vomiting, or acute GI distress Musculoskeletal: Denies any acute onset joint swelling, redness, loss of ROM,  or weakness Neurological: No reported episodes of acute  onset apraxia, aphasia, dysarthria, agnosia, amnesia, paralysis, loss of coordination, or loss of consciousness  Allergies  Ms. Kiely is allergic to amlodipine; quinapril; hydrochlorothiazide; atenolol; and penicillins.  [place]  Drug: Ms. Shinall  reports that she does not use drugs. Alcohol:  reports that she does not drink alcohol. Tobacco:  reports that she has been smoking cigarettes.  She has been smoking about 0.50 packs per day. she has never used smokeless tobacco. Medical:  has a past medical history of Abscess of breast, right (02/04/2015), DDD (degenerative disc disease), lumbar, Diabetes mellitus without complication ([place]), Hyperlipidemia, Hypertension, MI (myocardial infarction) ([place]) (2015), and Stroke ([place]) (2017). Surgical: Ms. Christoph  has no past surgical history on file. Family: family history includes Cancer in her brother; Diabetes in her mother; Hypertension in her father and mother; Osteoarthritis in her father and mother; Stroke in her father.  Constitutional Exam  General appearance: Well nourished, well developed, and well hydrated. In no apparent acute distress There were no vitals filed for this visit. BMI Assessment: Estimated body mass index is 42.07 kg/m as calculated from the following:   Height as of 09/08/17: '5\' 2"'$  (1.575 m).   Weight as of 09/08/17: 230 lb (104.3 kg).  BMI interpretation table: BMI level Category Range association with higher incidence of chronic pain  <18 kg/m2 Underweight   18.5-24.9 kg/m2 Ideal body weight   25-29.9 kg/m2 Overweight Increased incidence by 20%  30-34.9 kg/m2 Obese (Class I) Increased incidence by 68%  35-39.9 kg/m2 Severe obesity (Class II) Increased incidence by 136%  >40 kg/m2 Extreme obesity (Class III) Increased incidence by 254%   BMI Readings from Last 4 Encounters:  09/08/17 42.07 kg/m  08/26/17 45.73 kg/m  07/28/17 47.19 kg/m  06/22/17 47.48 kg/m    Wt Readings from Last 4 Encounters:  09/08/17 230 lb (104.3 kg)  08/26/17 250 lb (113.4 kg)  07/28/17 258 lb (117 kg)  06/22/17 259 lb 9.6 oz (117.8 kg)  Psych/Mental status: Alert, oriented x 3 (person, place, & time)       Eyes: PERLA Respiratory: No evidence of acute respiratory distress  Cervical Spine Area Exam  Skin & Axial Inspection: No masses, redness, edema, swelling, or associated skin lesions Alignment: Symmetrical Functional ROM: Unrestricted ROM      Stability: No instability detected Muscle Tone/Strength: Functionally intact. No obvious neuro-muscular anomalies detected. Sensory (Neurological): Unimpaired Palpation: No palpable anomalies              Upper Extremity (UE) Exam    Side: Right upper extremity  Side: Left upper extremity  Skin & Extremity Inspection: Skin color, temperature, and hair growth are WNL. No peripheral edema or cyanosis. No masses, redness, swelling, asymmetry, or associated skin lesions. No contractures.  Skin & Extremity Inspection: Skin color, temperature, and hair growth are WNL. No peripheral edema or cyanosis. No masses, redness, swelling, asymmetry, or associated skin lesions. No contractures.  Functional ROM: Unrestricted ROM          Functional ROM: Unrestricted ROM          Muscle Tone/Strength: Functionally intact. No obvious neuro-muscular anomalies detected.  Muscle Tone/Strength: Functionally intact. No obvious neuro-muscular anomalies detected.  Sensory (Neurological): Unimpaired          Sensory (Neurological): Unimpaired          Palpation: No palpable anomalies              Palpation: No palpable anomalies  Specialized Test(s): Deferred         Specialized Test(s): Deferred          Thoracic Spine Area Exam  Skin & Axial Inspection: No masses, redness, or swelling Alignment: Symmetrical Functional ROM: Unrestricted ROM Stability: No instability detected Muscle Tone/Strength: Functionally intact. No obvious  neuro-muscular anomalies detected. Sensory (Neurological): Unimpaired Muscle strength & Tone: No palpable anomalies  Lumbar Spine Area Exam  Skin & Axial Inspection: No masses, redness, or swelling Alignment: Symmetrical Functional ROM: Unrestricted ROM      Stability: No instability detected Muscle Tone/Strength: Functionally intact. No obvious neuro-muscular anomalies detected. Sensory (Neurological): Unimpaired Palpation: No palpable anomalies       Provocative Tests: Lumbar Hyperextension and rotation test: evaluation deferred today       Lumbar Lateral bending test: evaluation deferred today       Patrick's Maneuver: evaluation deferred today                    Gait & Posture Assessment  Ambulation: Unassisted Gait: Relatively normal for age and body habitus Posture: WNL   Lower Extremity Exam    Side: Right lower extremity  Side: Left lower extremity  Skin & Extremity Inspection: Skin color, temperature, and hair growth are WNL. No peripheral edema or cyanosis. No masses, redness, swelling, asymmetry, or associated skin lesions. No contractures.  Skin & Extremity Inspection: Skin color, temperature, and hair growth are WNL. No peripheral edema or cyanosis. No masses, redness, swelling, asymmetry, or associated skin lesions. No contractures.  Functional ROM: Unrestricted ROM          Functional ROM: Unrestricted ROM          Muscle Tone/Strength: Functionally intact. No obvious neuro-muscular anomalies detected.  Muscle Tone/Strength: Functionally intact. No obvious neuro-muscular anomalies detected.  Sensory (Neurological): Unimpaired  Sensory (Neurological): Unimpaired  Palpation: No palpable anomalies  Palpation: No palpable anomalies   Assessment  Primary Diagnosis & Pertinent Problem List: The primary encounter diagnosis was Chronic neck pain (Primary Area of Pain) (Bilateral) (L>R). Diagnoses of Chronic low back pain (Second Area of Pain) (Bilateral) (L>R), Chronic lower  extremity pain (Tertiary Area of Pain) (Bilateral) (L>R), Chronic upper extremity pain (Fourth Area of Pain) (Bilateral) (L>R), and Chronic pain syndrome were also pertinent to this visit.  Status Diagnosis  Controlled Controlled Controlled 1. Chronic neck pain (Primary Area of Pain) (Bilateral) (L>R)   2. Chronic low back pain (Second Area of Pain) (Bilateral) (L>R)   3. Chronic lower extremity pain (Tertiary Area of Pain) (Bilateral) (L>R)   4. Chronic upper extremity pain (Fourth Area of Pain) (Bilateral) (L>R)   5. Chronic pain syndrome     Problems updated and reviewed during this visit: No problems updated. Plan of Care  Pharmacotherapy (Medications Ordered): No orders of the defined types were placed in this encounter.  Medications administered today: Sheng D. Moffitt had no medications administered during this visit.  Procedure Orders    No procedure(s) ordered today   Lab Orders  No laboratory test(s) ordered today   Imaging Orders  No imaging studies ordered today   Referral Orders  No referral(s) requested today    Interventional management options: Planned, scheduled, and/or pending:   ***   Considering:   Diagnosticleft sided cervical epidural steroid injection Diagnosticleft-sided cervical facet block DiagnosticLeft sided Cervical radiofrequency ablation Diagnosticleft-sided thoracic facet block Diagnosticbilateral lumbar epidural steroid injection Diagnosticbilateral lumbar facet  Possible bilateral radiofrequency ablation   Palliative PRN treatment(s):   Palliative  left cervical epidural steroid injection #2 under fluoroscopic guidance and IV sedation    Provider-requested follow-up: No Follow-up on file.  Future Appointments  Date Time Provider [place]  10/26/2017  2:00 PM Milinda Pointer, MD [facility] None   Primary Care Physician: Morgan Rise, NP Location: [facility] Note by: Gaspar Cola, MD Date: 10/26/2017; Time: 8:13 PM

## 2017-10-26 ENCOUNTER — Ambulatory Visit: Payer: Medicaid Other | Admitting: Pain Medicine

## 2017-10-28 ENCOUNTER — Ambulatory Visit: Payer: Medicaid Other | Admitting: Pain Medicine

## 2017-10-28 NOTE — Progress Notes (Deleted)
Patient's Name: Morgan Patel  MRN: [id number]  Referring Provider: Inge Rise, NP  DOB: 05-Mar-1971  PCP: Inge Rise, NP  DOS: 10/28/2017  Note by: Gaspar Cola, MD  Service setting: Ambulatory outpatient  Specialty: Interventional Pain Management  Location: [facility] (AMB) Pain Management Facility    Patient type: Established   Primary Reason(s) for Visit: Encounter for post-procedure evaluation of chronic illness with mild to moderate exacerbation CC: No chief complaint on file.  HPI  Morgan Patel is a 47 y.o. year old, female patient, who comes today for a post-procedure evaluation. She has Basilar artery stenosis; Coronary artery disease; DDD (degenerative disc disease), lumbar; Encounter for long-term (current) use of other high-risk medications; Encounter for tobacco use cessation counseling; Essential hypertension; Gastroesophageal reflux disease; Ischemic stroke ([place]); Lumbar facet arthropathy (Bilateral); Lumbosacral radiculopathy; Major depressive disorder with current active episode; Chronic myofascial pain; Nicotine dependence, uncomplicated; Other chronic pain; Tobacco use disorder; Type 2 diabetes mellitus with complication, without long-term current use of insulin ([place]); Visual field loss following stroke; Chronic neck pain (Primary Area of Pain) (Bilateral) (L>R); Chronic low back pain (Second Area of Pain) (Bilateral) (L>R); Chronic pain syndrome; Chronic sacroiliac joint pain (Bilateral) (L>R); Vitamin D deficiency; DDD (degenerative disc disease), cervical; Disorder of skeletal system; Pharmacologic therapy; Problems influencing health status; Chronic thoracic back pain (Bilateral) (L>R); Chronic upper extremity pain (Fourth Area of Pain) (Bilateral) (L>R); Lumbar facet syndrome (Bilateral) (L>R); Cervical spondylosis; Lumbar central spinal stenosis; Chronic lower extremity pain (Tertiary Area of Pain) (Bilateral) (L>R); Social fears; and Chronic cervical radicular pain  (Bilateral) on their problem list. Her primarily concern today is the No chief complaint on file.  Pain Assessment: Location:     Radiating:   Onset:   Duration:   Quality:   Severity:  /10 (self-reported pain score)  Note: Reported level is compatible with observation.                         When using our objective Pain Scale, levels between 6 and 10/10 are said to belong in an emergency room, as it progressively worsens from a 6/10, described as severely limiting, requiring emergency care not usually available at an outpatient pain management facility. At a 6/10 level, communication becomes difficult and requires great effort. Assistance to reach the emergency department may be required. Facial flushing and profuse sweating along with potentially dangerous increases in heart rate and blood pressure will be evident. Effect on ADL:   Timing:   Modifying factors:    Morgan Patel comes in today for post-procedure evaluation after the treatment done on 10/26/2017.  Further details on both, my assessment(s), as well as the proposed treatment plan, please see below.  Post-Procedure Assessment  10/26/2017 Procedure: Diagnostic bilateral lumbar facet block #1 under fluoroscopic guidance and NO sedation  Pre-procedure pain score:  6/10 Post-procedure pain score: 0/10 (100% relief) Influential Factors: BMI:   Intra-procedural challenges: None observed.         Assessment challenges: None detected.              Reported side-effects: None.        Post-procedural adverse reactions or complications: None reported         Sedation: No sedation used. When no sedatives are used, the analgesic levels obtained are directly associated to the effectiveness of the local anesthetics. However, when sedation is provided, the level of analgesia obtained during the initial 1 hour following the intervention,  is believed to be the result of a combination of factors. These factors may include, but are not limited  to: 1. The effectiveness of the local anesthetics used. 2. The effects of the analgesic(s) and/or anxiolytic(s) used. 3. The degree of discomfort experienced by the patient at the time of the procedure. 4. The patients ability and reliability in recalling and recording the events. 5. The presence and influence of possible secondary gains and/or psychosocial factors. Reported result: Relief experienced during the 1st hour after the procedure:   (Ultra-Short Term Relief)            Interpretative annotation: Clinically appropriate result. No IV Analgesic or Anxiolytic given, therefore benefits are completely due to Local Anesthetic effects.          Effects of local anesthetic: The analgesic effects attained during this period are directly associated to the localized infiltration of local anesthetics and therefore cary significant diagnostic value as to the etiological location, or anatomical origin, of the pain. Expected duration of relief is directly dependent on the pharmacodynamics of the local anesthetic used. Long-acting (4-6 hours) anesthetics used.  Reported result: Relief during the next 4 to 6 hour after the procedure:   (Short-Term Relief)            Interpretative annotation: Clinically appropriate result. Analgesia during this period is likely to be Local Anesthetic-related.          Long-term benefit: Defined as the period of time past the expected duration of local anesthetics (1 hour for short-acting and 4-6 hours for long-acting). With the possible exception of prolonged sympathetic blockade from the local anesthetics, benefits during this period are typically attributed to, or associated with, other factors such as analgesic sensory neuropraxia, antiinflammatory effects, or beneficial biochemical changes provided by agents other than the local anesthetics.  Reported result: Extended relief following procedure:   (Long-Term Relief)            Interpretative annotation: Clinically  appropriate result. Good relief. No permanent benefit expected. Inflammation plays a part in the etiology to the pain.          Current benefits: Defined as reported results that persistent at this point in time.   Analgesia: *** %            Function: Somewhat improved ROM: Somewhat improved Interpretative annotation: Recurrence of symptoms. No permanent benefit expected. Effective diagnostic intervention.          Interpretation: Results would suggest a successful diagnostic intervention.                  Plan:  Please see "Plan of Care" for details.        Laboratory Chemistry  Inflammation Markers (CRP: Acute Phase) (ESR: Chronic Phase) Lab Results  Component Value Date   CRP 0.4 05/28/2017   ESRSEDRATE 2 05/28/2017                 Renal Function Markers Lab Results  Component Value Date   BUN 14 05/28/2017   CREATININE 0.60 05/28/2017   GFRAA 126 05/28/2017   GFRNONAA 109 05/28/2017                 Hepatic Function Markers Lab Results  Component Value Date   AST 25 05/28/2017   ALT 21 05/28/2017   ALBUMIN 4.2 05/28/2017   ALKPHOS 107 05/28/2017                 Electrolytes Lab Results  Component  Value Date   NA 144 05/28/2017   K 4.6 05/28/2017   CL 107 (H) 05/28/2017   CALCIUM 10.0 05/28/2017   MG 1.8 05/28/2017                 Neuropathy Markers Lab Results  Component Value Date   VITAMINB12 700 05/28/2017                 Bone Pathology Markers Lab Results  Component Value Date   25OHVITD1 14 (L) 05/28/2017   25OHVITD2 <1.0 05/28/2017   25OHVITD3 13 05/28/2017                 Coagulation Parameters Lab Results  Component Value Date   INR 1.0 06/15/2014   LABPROT 12.8 06/15/2014   APTT 25.1 06/15/2014   PLT 281 09/20/2014                 Cardiovascular Markers Lab Results  Component Value Date   BNP 16 06/15/2014   CKMB 1.5 06/16/2014   TROPONINI < 0.02 09/20/2014   HGB 13.0 09/20/2014   HCT 39.9 09/20/2014                 Note:  Lab results reviewed.  Recent Diagnostic Imaging Results  DG C-Arm 1-60 Min-No Report Fluoroscopy was utilized by the requesting physician.  No radiographic  interpretation.   Complexity Note: Imaging results reviewed.                         Meds   Current Outpatient Medications:  .  aspirin EC 81 MG tablet, Take 81 mg by mouth daily., Disp: , Rfl:  .  atorvastatin (LIPITOR) 80 MG tablet, Take 80 mg by mouth daily., Disp: , Rfl:  .  cyclobenzaprine (FLEXERIL) 10 MG tablet, Take 10 mg by mouth at bedtime as needed., Disp: , Rfl:  .  DULoxetine (CYMBALTA) 20 MG capsule, Take 20 mg by mouth., Disp: , Rfl:  .  fluticasone (FLONASE) 50 MCG/ACT nasal spray, Place 1 spray into both nostrils daily., Disp: , Rfl:  .  glipiZIDE (GLUCOTROL) 10 MG tablet, Take 10 mg by mouth 2 (two) times daily before a meal., Disp: , Rfl:  .  glucose blood (FREESTYLE TEST STRIPS) test strip, 1 strip as needed., Disp: , Rfl:  .  hydrALAZINE (APRESOLINE) 100 MG tablet, Take 100 mg by mouth 3 (three) times daily., Disp: , Rfl:  .  ibuprofen (ADVIL,MOTRIN) 600 MG tablet, Take 600 mg by mouth daily., Disp: , Rfl:  .  metFORMIN (GLUCOPHAGE) 500 MG tablet, Take 500 mg by mouth 2 (two) times daily with a meal., Disp: , Rfl:  .  nitroGLYCERIN (NITROSTAT) 0.4 MG SL tablet, Place 0.4 mg under the tongue as needed., Disp: , Rfl:  .  pregabalin (LYRICA) 150 MG capsule, Take 150 mg by mouth 2 (two) times daily., Disp: , Rfl:  .  traMADol (ULTRAM) 50 MG tablet, Take 1 tablet (50 mg total) by mouth every 6 (six) hours as needed for severe pain., Disp: 120 tablet, Rfl: 0 .  verapamil (CALAN-SR) 240 MG CR tablet, Take 240 mg by mouth 2 (two) times daily., Disp: , Rfl:   ROS  Constitutional: Denies any fever or chills Gastrointestinal: No reported hemesis, hematochezia, vomiting, or acute GI distress Musculoskeletal: Denies any acute onset joint swelling, redness, loss of ROM, or weakness Neurological: No reported episodes of  acute onset apraxia, aphasia, dysarthria, agnosia, amnesia, paralysis, loss  of coordination, or loss of consciousness  Allergies  Morgan Patel is allergic to amlodipine; quinapril; hydrochlorothiazide; atenolol; and penicillins.  [place]  Drug: Morgan Patel  reports that she does not use drugs. Alcohol:  reports that she does not drink alcohol. Tobacco:  reports that she has been smoking cigarettes.  She has been smoking about 0.50 packs per day. she has never used smokeless tobacco. Medical:  has a past medical history of Abscess of breast, right (02/04/2015), DDD (degenerative disc disease), lumbar, Diabetes mellitus without complication ([place]), Hyperlipidemia, Hypertension, MI (myocardial infarction) ([place]) (2015), and Stroke ([place]) (2017). Surgical: Morgan Patel  has no past surgical history on file. Family: family history includes Cancer in her brother; Diabetes in her mother; Hypertension in her father and mother; Osteoarthritis in her father and mother; Stroke in her father.  Constitutional Exam  General appearance: Well nourished, well developed, and well hydrated. In no apparent acute distress There were no vitals filed for this visit. BMI Assessment: Estimated body mass index is 42.07 kg/m as calculated from the following:   Height as of 09/08/17: 5' 2" (1.575 m).   Weight as of 09/08/17: 230 lb (104.3 kg).  BMI interpretation table: BMI level Category Range association with higher incidence of chronic pain  <18 kg/m2 Underweight   18.5-24.9 kg/m2 Ideal body weight   25-29.9 kg/m2 Overweight Increased incidence by 20%  30-34.9 kg/m2 Obese (Class I) Increased incidence by 68%  35-39.9 kg/m2 Severe obesity (Class II) Increased incidence by 136%  >40 kg/m2 Extreme obesity (Class III) Increased incidence by 254%   BMI Readings from Last 4 Encounters:  09/08/17 42.07 kg/m  08/26/17 45.73 kg/m  07/28/17 47.19 kg/m  06/22/17 47.48 kg/m   Wt Readings from Last 4 Encounters:  09/08/17  230 lb (104.3 kg)  08/26/17 250 lb (113.4 kg)  07/28/17 258 lb (117 kg)  06/22/17 259 lb 9.6 oz (117.8 kg)  Psych/Mental status: Alert, oriented x 3 (person, place, & time)       Eyes: PERLA Respiratory: No evidence of acute respiratory distress  Cervical Spine Area Exam  Skin & Axial Inspection: No masses, redness, edema, swelling, or associated skin lesions Alignment: Symmetrical Functional ROM: Unrestricted ROM      Stability: No instability detected Muscle Tone/Strength: Functionally intact. No obvious neuro-muscular anomalies detected. Sensory (Neurological): Unimpaired Palpation: No palpable anomalies              Upper Extremity (UE) Exam    Side: Right upper extremity  Side: Left upper extremity  Skin & Extremity Inspection: Skin color, temperature, and hair growth are WNL. No peripheral edema or cyanosis. No masses, redness, swelling, asymmetry, or associated skin lesions. No contractures.  Skin & Extremity Inspection: Skin color, temperature, and hair growth are WNL. No peripheral edema or cyanosis. No masses, redness, swelling, asymmetry, or associated skin lesions. No contractures.  Functional ROM: Unrestricted ROM          Functional ROM: Unrestricted ROM          Muscle Tone/Strength: Functionally intact. No obvious neuro-muscular anomalies detected.  Muscle Tone/Strength: Functionally intact. No obvious neuro-muscular anomalies detected.  Sensory (Neurological): Unimpaired          Sensory (Neurological): Unimpaired          Palpation: No palpable anomalies              Palpation: No palpable anomalies              Specialized Test(s): Deferred  Specialized Test(s): Deferred          Thoracic Spine Area Exam  Skin & Axial Inspection: No masses, redness, or swelling Alignment: Symmetrical Functional ROM: Unrestricted ROM Stability: No instability detected Muscle Tone/Strength: Functionally intact. No obvious neuro-muscular anomalies detected. Sensory  (Neurological): Unimpaired Muscle strength & Tone: No palpable anomalies  Lumbar Spine Area Exam  Skin & Axial Inspection: No masses, redness, or swelling Alignment: Symmetrical Functional ROM: Improved after treatment      Stability: No instability detected Muscle Tone/Strength: Functionally intact. No obvious neuro-muscular anomalies detected. Sensory (Neurological): Unimpaired Palpation: No palpable anomalies       Provocative Tests: Lumbar Hyperextension and rotation test: evaluation deferred today       Lumbar Lateral bending test: evaluation deferred today       Patrick's Maneuver: evaluation deferred today                    Gait & Posture Assessment  Ambulation: Unassisted Gait: Relatively normal for age and body habitus Posture: WNL   Lower Extremity Exam    Side: Right lower extremity  Side: Left lower extremity  Skin & Extremity Inspection: Skin color, temperature, and hair growth are WNL. No peripheral edema or cyanosis. No masses, redness, swelling, asymmetry, or associated skin lesions. No contractures.  Skin & Extremity Inspection: Skin color, temperature, and hair growth are WNL. No peripheral edema or cyanosis. No masses, redness, swelling, asymmetry, or associated skin lesions. No contractures.  Functional ROM: Unrestricted ROM          Functional ROM: Unrestricted ROM          Muscle Tone/Strength: Functionally intact. No obvious neuro-muscular anomalies detected.  Muscle Tone/Strength: Functionally intact. No obvious neuro-muscular anomalies detected.  Sensory (Neurological): Unimpaired  Sensory (Neurological): Unimpaired  Palpation: No palpable anomalies  Palpation: No palpable anomalies   Assessment  Primary Diagnosis & Pertinent Problem List: The primary encounter diagnosis was Chronic low back pain (Second Area of Pain) (Bilateral) (L>R). Diagnoses of Lumbar facet syndrome (Bilateral) (L>R), Chronic neck pain (Primary Area of Pain) (Bilateral) (L>R), and Chronic  lower extremity pain (Tertiary Area of Pain) (Bilateral) (L>R) were also pertinent to this visit.  Status Diagnosis  Controlled Controlled Controlled 1. Chronic low back pain (Second Area of Pain) (Bilateral) (L>R)   2. Lumbar facet syndrome (Bilateral) (L>R)   3. Chronic neck pain (Primary Area of Pain) (Bilateral) (L>R)   4. Chronic lower extremity pain (Tertiary Area of Pain) (Bilateral) (L>R)     Problems updated and reviewed during this visit: No problems updated. Plan of Care  Pharmacotherapy (Medications Ordered): No orders of the defined types were placed in this encounter.  Medications administered today: Morgan Patel had no medications administered during this visit.  Procedure Orders    No procedure(s) ordered today   Lab Orders  No laboratory test(s) ordered today   Imaging Orders  No imaging studies ordered today   Referral Orders  No referral(s) requested today    Interventional management options: Planned, scheduled, and/or pending:   ***   Considering:   Diagnosticleft sided cervical epidural steroid injection Diagnosticleft-sided cervical facet block DiagnosticLeft sided Cervical radiofrequency ablation Diagnosticleft-sided thoracic facet block Diagnosticbilateral lumbar epidural steroid injection Diagnosticbilateral lumbar facet  Possible bilateral radiofrequency ablation   Palliative PRN treatment(s):   Palliative left cervical epidural steroid injection #2 under fluoroscopic guidance and IV sedation  Diagnostic bilateral lumbar facet block #2 under fluoroscopic guidance and IV sedation    Provider-requested  follow-up: No Follow-up on file.  Future Appointments  Date Time Provider [place]  10/28/2017  1:30 PM Milinda Pointer, MD [facility] None   Primary Care Physician: Inge Rise, NP Location: [facility] Note by: Gaspar Cola, MD Date: 10/28/2017; Time: 6:39 AM

## 2017-11-04 ENCOUNTER — Ambulatory Visit: Payer: Medicaid Other | Admitting: Pain Medicine

## 2017-11-12 ENCOUNTER — Telehealth: Payer: Self-pay | Admitting: *Deleted

## 2017-11-12 ENCOUNTER — Encounter: Payer: Self-pay | Admitting: *Deleted

## 2017-11-23 ENCOUNTER — Ambulatory Visit: Payer: Medicaid Other | Admitting: Pain Medicine

## 2017-12-01 NOTE — Progress Notes (Signed)
Patient's Name: Morgan Patel  MRN: [id number]  Referring Provider: Inge Rise, NP  DOB: 12/03/1970  PCP: Inge Rise, NP  DOS: 12/02/2017  Note by: Gaspar Cola, MD  Service setting: Ambulatory outpatient  Specialty: Interventional Pain Management  Location: [facility] (AMB) Pain Management Facility    Patient type: Established   Primary Reason(s) for Visit: Encounter for post-procedure evaluation of chronic illness with mild to moderate exacerbation CC: Back Pain (lower) and Neck Pain  HPI  Ms. Viele is a 47 y.o. year old, female patient, who comes today for a post-procedure evaluation. She has Basilar artery stenosis; Coronary artery disease; DDD (degenerative disc disease), lumbar; Encounter for long-term (current) use of other high-risk medications; Encounter for tobacco use cessation counseling; Essential hypertension; Gastroesophageal reflux disease; Ischemic stroke ([place]); Lumbar facet arthropathy (Bilateral); Lumbosacral radiculopathy; Major depressive disorder with current active episode; Chronic myofascial pain; Nicotine dependence, uncomplicated; Other chronic pain; Tobacco use disorder; Type 2 diabetes mellitus with complication, without long-term current use of insulin ([place]); Visual field loss following stroke; Chronic neck pain (Primary Area of Pain) (Bilateral) (L>R); Chronic low back pain (Second Area of Pain) (Bilateral) (L>R); Chronic pain syndrome; Chronic sacroiliac joint pain (Bilateral) (L>R); Vitamin D deficiency; DDD (degenerative disc disease), cervical; Disorder of skeletal system; Pharmacologic therapy; Problems influencing health status; Chronic thoracic back pain (Bilateral) (L>R); Chronic upper extremity pain (Fourth Area of Pain) (Bilateral) (L>R); Lumbar facet syndrome (Bilateral) (L>R); Cervical spondylosis; Lumbar central spinal stenosis; Chronic lower extremity pain (Tertiary Area of Pain) (Bilateral) (L>R); Social fears; Chronic cervical radicular pain  (Bilateral) (L>R); Morbid (severe) obesity due to excess calories ([place]); Fibromyalgia syndrome; and Neurogenic pain on their problem list. Her primarily concern today is the Back Pain (lower) and Neck Pain  Pain Assessment: Location: Lower Back Radiating: numbness in both arms and hands Onset: More than a month ago Duration: Chronic pain Quality: Constant Severity: 8 /10 (self-reported pain score)  Note: Reported level is inconsistent with clinical observations. Clinically the patient looks like a 3/10 A 3/10 is viewed as "Moderate" and described as significantly interfering with activities of daily living (ADL). It becomes difficult to feed, bathe, get dressed, get on and off the toilet or to perform personal hygiene functions. Difficult to get in and out of bed or a chair without assistance. Very distracting. With effort, it can be ignored when deeply involved in activities. Ms. Jastrzebski does not seem to understand the use of our objective pain scale When using our objective Pain Scale, levels between 6 and 10/10 are said to belong in an emergency room, as it progressively worsens from a 6/10, described as severely limiting, requiring emergency care not usually available at an outpatient pain management facility. At a 6/10 level, communication becomes difficult and requires great effort. Assistance to reach the emergency department may be required. Facial flushing and profuse sweating along with potentially dangerous increases in heart rate and blood pressure will be evident. Timing: Constant Modifying factors: lying down, changing positions  Ms. Doner comes in today for post-procedure evaluation after the treatment done on 10/26/2017.  Further details on both, my assessment(s), as well as the proposed treatment plan, please see below.  Post-Procedure Assessment  10/26/2017 Procedure: Diagnostic bilateral lumbar facet block #1 under fluoroscopic guidance and IV sedation Pre-procedure pain score:   6/10 Post-procedure pain score: 0/10 (100% relief) Influential Factors: BMI: 52.54 kg/m Intra-procedural challenges: None observed.         Assessment challenges: None detected.  Reported side-effects: None.        Post-procedural adverse reactions or complications: None reported         Sedation: Sedation provided. When no sedatives are used, the analgesic levels obtained are directly associated to the effectiveness of the local anesthetics. However, when sedation is provided, the level of analgesia obtained during the initial 1 hour following the intervention, is believed to be the result of a combination of factors. These factors may include, but are not limited to: 1. The effectiveness of the local anesthetics used. 2. The effects of the analgesic(s) and/or anxiolytic(s) used. 3. The degree of discomfort experienced by the patient at the time of the procedure. 4. The patients ability and reliability in recalling and recording the events. 5. The presence and influence of possible secondary gains and/or psychosocial factors. Reported result: Relief experienced during the 1st hour after the procedure: 100 % (Ultra-Short Term Relief) Ms. Mcclish has indicated area to have been numb during this time. Interpretative annotation: Clinically appropriate result. Analgesia during this period is likely to be Local Anesthetic and/or IV Sedative (Analgesic/Anxiolytic) related.          Effects of local anesthetic: The analgesic effects attained during this period are directly associated to the localized infiltration of local anesthetics and therefore cary significant diagnostic value as to the etiological location, or anatomical origin, of the pain. Expected duration of relief is directly dependent on the pharmacodynamics of the local anesthetic used. Long-acting (4-6 hours) anesthetics used.  Reported result: Relief during the next 4 to 6 hour after the procedure: 100 % (Short-Term Relief) Ms.  Guilbault has indicated area to have been numb during this time. Interpretative annotation: Clinically appropriate result. Analgesia during this period is likely to be Local Anesthetic-related.          Long-term benefit: Defined as the period of time past the expected duration of local anesthetics (1 hour for short-acting and 4-6 hours for long-acting). With the possible exception of prolonged sympathetic blockade from the local anesthetics, benefits during this period are typically attributed to, or associated with, other factors such as analgesic sensory neuropraxia, antiinflammatory effects, or beneficial biochemical changes provided by agents other than the local anesthetics.  Reported result: Extended relief following procedure: 100 %(lasted 3 days) (Long-Term Relief)            Interpretative annotation: Clinically appropriate result. Good relief. No permanent benefit expected. Inflammation plays a part in the etiology to the pain.          Current benefits: Defined as reported results that persistent at this point in time.   Analgesia: >50 % Ms. Dhingra reports improvement of axial symptoms. Function: Somewhat improved ROM: Somewhat improved Interpretative annotation: Recurrence of symptoms. Therapeutic benefit observed. Effective therapeutic approach. Benefit could be steroid-related.  Interpretation: Results would suggest a successful diagnostic intervention.                  Plan:  Set up procedure as a PRN palliative treatment option for this patient. 2          Laboratory Chemistry  Inflammation Markers (CRP: Acute Phase) (ESR: Chronic Phase) Lab Results  Component Value Date   CRP 0.4 05/28/2017   ESRSEDRATE 2 05/28/2017                         Renal Function Markers Lab Results  Component Value Date   BUN 14 05/28/2017   CREATININE 0.60 05/28/2017  GFRAA 126 05/28/2017   GFRNONAA 109 05/28/2017                 Hepatic Function Markers Lab Results  Component Value  Date   AST 25 05/28/2017   ALT 21 05/28/2017   ALBUMIN 4.2 05/28/2017   ALKPHOS 107 05/28/2017                 Electrolytes Lab Results  Component Value Date   NA 144 05/28/2017   K 4.6 05/28/2017   CL 107 (H) 05/28/2017   CALCIUM 10.0 05/28/2017   MG 1.8 05/28/2017                        Neuropathy Markers Lab Results  Component Value Date   VITAMINB12 700 05/28/2017                 Bone Pathology Markers Lab Results  Component Value Date   25OHVITD1 14 (L) 05/28/2017   25OHVITD2 <1.0 05/28/2017   25OHVITD3 13 05/28/2017                         Coagulation Parameters Lab Results  Component Value Date   INR 1.0 06/15/2014   LABPROT 12.8 06/15/2014   APTT 25.1 06/15/2014   PLT 281 09/20/2014                 Cardiovascular Markers Lab Results  Component Value Date   BNP 16 06/15/2014   CKMB 1.5 06/16/2014   TROPONINI < 0.02 09/20/2014   HGB 13.0 09/20/2014   HCT 39.9 09/20/2014                 Note: Lab results reviewed.  Recent Diagnostic Imaging Results  DG C-Arm 1-60 Min-No Report Fluoroscopy was utilized by the requesting physician.  No radiographic  interpretation.   Complexity Note: I personally reviewed the fluoroscopic imaging of the procedure.                        Meds   Current Outpatient Medications:  .  aspirin EC 81 MG tablet, Take 81 mg by mouth daily., Disp: , Rfl:  .  atorvastatin (LIPITOR) 80 MG tablet, Take 80 mg by mouth daily., Disp: , Rfl:  .  cyclobenzaprine (FLEXERIL) 10 MG tablet, Take 10 mg by mouth at bedtime as needed., Disp: , Rfl:  .  DULoxetine (CYMBALTA) 20 MG capsule, Take 20 mg by mouth., Disp: , Rfl:  .  glipiZIDE (GLUCOTROL) 10 MG tablet, Take 10 mg by mouth 2 (two) times daily before a meal., Disp: , Rfl:  .  glucose blood (FREESTYLE TEST STRIPS) test strip, 1 strip as needed., Disp: , Rfl:  .  hydrALAZINE (APRESOLINE) 100 MG tablet, Take 100 mg by mouth 3 (three) times daily., Disp: , Rfl:  .  ibuprofen  (ADVIL,MOTRIN) 600 MG tablet, Take 600 mg by mouth daily., Disp: , Rfl:  .  metFORMIN (GLUCOPHAGE) 500 MG tablet, Take 500 mg by mouth 2 (two) times daily with a meal., Disp: , Rfl:  .  nitroGLYCERIN (NITROSTAT) 0.4 MG SL tablet, Place 0.4 mg under the tongue as needed., Disp: , Rfl:  .  pregabalin (LYRICA) 150 MG capsule, Take 1 capsule (150 mg total) by mouth 3 (three) times daily., Disp: 90 capsule, Rfl: 5 .  verapamil (CALAN-SR) 240 MG CR tablet, Take 240 mg by mouth 2 (two) times daily.,  Disp: , Rfl:  .  fluticasone (FLONASE) 50 MCG/ACT nasal spray, Place 1 spray into both nostrils daily., Disp: , Rfl:  .  traMADol (ULTRAM) 50 MG tablet, Take 1 tablet (50 mg total) by mouth every 6 (six) hours as needed for severe pain., Disp: 120 tablet, Rfl: 0  ROS  Constitutional: Denies any fever or chills Gastrointestinal: No reported hemesis, hematochezia, vomiting, or acute GI distress Musculoskeletal: Denies any acute onset joint swelling, redness, loss of ROM, or weakness Neurological: No reported episodes of acute onset apraxia, aphasia, dysarthria, agnosia, amnesia, paralysis, loss of coordination, or loss of consciousness  Allergies  Ms. Hildenbrand is allergic to amlodipine; quinapril; hydrochlorothiazide; atenolol; and penicillins.  [place]  Drug: Ms. Hamed  reports that she does not use drugs. Alcohol:  reports that she does not drink alcohol. Tobacco:  reports that she has been smoking cigarettes.  She has been smoking about 0.50 packs per day. She has never used smokeless tobacco. Medical:  has a past medical history of Abscess of breast, right (02/04/2015), DDD (degenerative disc disease), lumbar, Diabetes mellitus without complication ([place]), Hyperlipidemia, Hypertension, MI (myocardial infarction) ([place]) (2015), and Stroke ([place]) (2017). Surgical: Ms. Victorino  has no past surgical history on file. Family: family history includes Cancer in her brother; Diabetes in her mother; Hypertension in her  father and mother; Osteoarthritis in her father and mother; Stroke in her father.  Constitutional Exam  General appearance: Well nourished, well developed, and well hydrated. In no apparent acute distress Vitals:   12/02/17 1417  BP: (!) 145/87  Pulse: 93  Temp: 98.4 F (36.9 C)  TempSrc: Oral  SpO2: 99%  Weight: 269 lb (122 kg)  Height: 5' (1.524 m)  HC: 2" (5.1 cm)   BMI Assessment: Estimated body mass index is 52.54 kg/m as calculated from the following:   Height as of this encounter: 5' (1.524 m).   Weight as of this encounter: 269 lb (122 kg).  BMI interpretation table: BMI level Category Range association with higher incidence of chronic pain  <18 kg/m2 Underweight   18.5-24.9 kg/m2 Ideal body weight   25-29.9 kg/m2 Overweight Increased incidence by 20%  30-34.9 kg/m2 Obese (Class I) Increased incidence by 68%  35-39.9 kg/m2 Severe obesity (Class II) Increased incidence by 136%  >40 kg/m2 Extreme obesity (Class III) Increased incidence by 254%   BMI Readings from Last 4 Encounters:  12/02/17 52.54 kg/m  09/08/17 42.07 kg/m  08/26/17 45.73 kg/m  07/28/17 47.19 kg/m   Wt Readings from Last 4 Encounters:  12/02/17 269 lb (122 kg)  09/08/17 230 lb (104.3 kg)  08/26/17 250 lb (113.4 kg)  07/28/17 258 lb (117 kg)  Psych/Mental status: Alert, oriented x 3 (person, place, & time)       Eyes: PERLA Respiratory: No evidence of acute respiratory distress  Cervical Spine Area Exam  Skin & Axial Inspection: No masses, redness, edema, swelling, or associated skin lesions Alignment: Symmetrical Functional ROM: Decreased ROM      Stability: No instability detected Muscle Tone/Strength: Functionally intact. No obvious neuro-muscular anomalies detected. Sensory (Neurological): Movement-associated discomfort Palpation: Complains of area being tender to palpation              Upper Extremity (UE) Exam    Side: Right upper extremity  Side: Left upper extremity  Skin &  Extremity Inspection: Skin color, temperature, and hair growth are WNL. No peripheral edema or cyanosis. No masses, redness, swelling, asymmetry, or associated skin lesions. No contractures.  Skin &  Extremity Inspection: Skin color, temperature, and hair growth are WNL. No peripheral edema or cyanosis. No masses, redness, swelling, asymmetry, or associated skin lesions. No contractures.  Functional ROM: Unrestricted ROM          Functional ROM: Unrestricted ROM          Muscle Tone/Strength: Functionally intact. No obvious neuro-muscular anomalies detected.  Muscle Tone/Strength: Functionally intact. No obvious neuro-muscular anomalies detected.  Sensory (Neurological): Unimpaired          Sensory (Neurological): Unimpaired          Palpation: No palpable anomalies              Palpation: No palpable anomalies              Specialized Test(s): Deferred         Specialized Test(s): Deferred          Thoracic Spine Area Exam  Skin & Axial Inspection: No masses, redness, or swelling Alignment: Symmetrical Functional ROM: Unrestricted ROM Stability: No instability detected Muscle Tone/Strength: Functionally intact. No obvious neuro-muscular anomalies detected. Sensory (Neurological): Unimpaired Muscle strength & Tone: No palpable anomalies  Lumbar Spine Area Exam  Skin & Axial Inspection: No masses, redness, or swelling Alignment: Symmetrical Functional ROM: Improved after treatment      Stability: No instability detected Muscle Tone/Strength: Functionally intact. No obvious neuro-muscular anomalies detected. Sensory (Neurological): Movement-associated pain Palpation: Complains of area being tender to palpation       Provocative Tests: Lumbar Hyperextension and rotation test: Improved after treatment       Lumbar Lateral bending test: evaluation deferred today       Patrick's Maneuver: evaluation deferred today                    Gait & Posture Assessment  Ambulation: Unassisted Gait:  Relatively normal for age and body habitus Posture: WNL   Lower Extremity Exam    Side: Right lower extremity  Side: Left lower extremity  Skin & Extremity Inspection: Skin color, temperature, and hair growth are WNL. No peripheral edema or cyanosis. No masses, redness, swelling, asymmetry, or associated skin lesions. No contractures.  Skin & Extremity Inspection: Skin color, temperature, and hair growth are WNL. No peripheral edema or cyanosis. No masses, redness, swelling, asymmetry, or associated skin lesions. No contractures.  Functional ROM: Unrestricted ROM          Functional ROM: Unrestricted ROM          Muscle Tone/Strength: Functionally intact. No obvious neuro-muscular anomalies detected.  Muscle Tone/Strength: Functionally intact. No obvious neuro-muscular anomalies detected.  Sensory (Neurological): Unimpaired  Sensory (Neurological): Unimpaired  Palpation: No palpable anomalies  Palpation: No palpable anomalies   Assessment  Primary Diagnosis & Pertinent Problem List: The primary encounter diagnosis was DDD (degenerative disc disease), cervical. Diagnoses of Chronic cervical radicular pain (Bilateral), Fibromyalgia syndrome, Neurogenic pain, and Chronic pain syndrome were also pertinent to this visit.  Status Diagnosis  Controlled Controlled Controlled 1. DDD (degenerative disc disease), cervical   2. Chronic cervical radicular pain (Bilateral)   3. Fibromyalgia syndrome   4. Neurogenic pain   5. Chronic pain syndrome     Problems updated and reviewed during this visit: No problems updated. Plan of Care  Pharmacotherapy (Medications Ordered): Meds ordered this encounter  Medications  . pregabalin (LYRICA) 150 MG capsule    Sig: Take 1 capsule (150 mg total) by mouth 3 (three) times daily.    Dispense:  90 capsule  Refill:  5    Do not place medication on "Automatic Refill". Fill one day early if pharmacy is closed on scheduled refill date.   Medications  administered today: Ireoluwa D. Sugrue had no medications administered during this visit.   Procedure Orders     Cervical Epidural Injection     Cervical Epidural Injection Lab Orders  No laboratory test(s) ordered today   Imaging Orders  No imaging studies ordered today   Referral Orders  No referral(s) requested today   Interventional management options: Planned, scheduled, and/or pending:   Therapeutic left-sided CESI #2 under fluoroscopic guidance and IV sedation   Considering:   Diagnosticleft sided cervical epidural steroid injection Diagnosticleft-sided cervical facet block DiagnosticLeft sided Cervical radiofrequency ablation Diagnosticleft-sided thoracic facet block Diagnosticbilateral lumbar epidural steroid injection Diagnosticbilateral lumbar facet  Possible bilateral radiofrequency ablation   Palliative PRN treatment(s):   Palliative bilateral lumbar facet block #2  Palliative left cervical epidural steroid injection #2    Provider-requested follow-up: Return for Procedure (w/ sedation): (L) CESI #2.  Future Appointments  Date Time Provider [place]  12/29/2017  9:30 AM Milinda Pointer, MD [facility] None   Primary Care Physician: Inge Rise, NP Location: [facility] Note by: Gaspar Cola, MD Date: 12/02/2017; Time: 1:53 PM

## 2017-12-02 ENCOUNTER — Encounter: Payer: Self-pay | Admitting: Pain Medicine

## 2017-12-02 ENCOUNTER — Other Ambulatory Visit: Payer: Self-pay

## 2017-12-02 ENCOUNTER — Ambulatory Visit: Payer: Medicaid Other | Attending: Pain Medicine | Admitting: Pain Medicine

## 2017-12-02 VITALS — BP 145/87 | HR 93 | Temp 98.4°F | Ht 60.0 in | Wt 269.0 lb

## 2017-12-02 DIAGNOSIS — M5030 Other cervical disc degeneration, unspecified cervical region: Secondary | ICD-10-CM

## 2017-12-02 DIAGNOSIS — Z7984 Long term (current) use of oral hypoglycemic drugs: Secondary | ICD-10-CM | POA: Insufficient documentation

## 2017-12-02 DIAGNOSIS — M797 Fibromyalgia: Secondary | ICD-10-CM

## 2017-12-02 DIAGNOSIS — M5412 Radiculopathy, cervical region: Secondary | ICD-10-CM | POA: Insufficient documentation

## 2017-12-02 DIAGNOSIS — F17210 Nicotine dependence, cigarettes, uncomplicated: Secondary | ICD-10-CM | POA: Insufficient documentation

## 2017-12-02 DIAGNOSIS — G8929 Other chronic pain: Secondary | ICD-10-CM | POA: Insufficient documentation

## 2017-12-02 DIAGNOSIS — Z880 Allergy status to penicillin: Secondary | ICD-10-CM | POA: Insufficient documentation

## 2017-12-02 DIAGNOSIS — Z7982 Long term (current) use of aspirin: Secondary | ICD-10-CM | POA: Diagnosis not present

## 2017-12-02 DIAGNOSIS — Z888 Allergy status to other drugs, medicaments and biological substances status: Secondary | ICD-10-CM | POA: Insufficient documentation

## 2017-12-02 DIAGNOSIS — G894 Chronic pain syndrome: Secondary | ICD-10-CM

## 2017-12-02 DIAGNOSIS — M792 Neuralgia and neuritis, unspecified: Secondary | ICD-10-CM | POA: Insufficient documentation

## 2017-12-02 DIAGNOSIS — Z79899 Other long term (current) drug therapy: Secondary | ICD-10-CM | POA: Insufficient documentation

## 2017-12-02 MED ORDER — PREGABALIN 150 MG PO CAPS: 150.0000 mg | ORAL_CAPSULE | Freq: Three times a day (TID) | ORAL | 5 refills | Status: DC

## 2017-12-02 NOTE — Progress Notes (Signed)
Safety precautions to be maintained throughout the outpatient stay will include: orient to surroundings, keep bed in low position, maintain call bell within reach at all times, provide assistance with transfer out of bed and ambulation.  

## 2017-12-02 NOTE — Patient Instructions (Signed)
____________________________________________________________________________________________  Preparing for Procedure with Sedation  Instructions: . Oral Intake: Do not eat or drink anything for at least 8 hours prior to your procedure. . Transportation: Public transportation is not allowed. Bring an adult driver. The driver must be physically present in our waiting room before any procedure can be started. . Physical Assistance: Bring an adult physically capable of assisting you, in the event you need help. This adult should keep you company at home for at least 6 hours after the procedure. . Blood Pressure Medicine: Take your blood pressure medicine with a sip of water the morning of the procedure. . Blood thinners:  . Diabetics on insulin: Notify the staff so that you can be scheduled 1st case in the morning. If your diabetes requires high dose insulin, take only  of your normal insulin dose the morning of the procedure and notify the staff that you have done so. . Preventing infections: Shower with an antibacterial soap the morning of your procedure. . Build-up your immune system: Take 1000 mg of Vitamin C with every meal (3 times a day) the day prior to your procedure. . Antibiotics: Inform the staff if you have a condition or reason that requires you to take antibiotics before dental procedures. . Pregnancy: If you are pregnant, call and cancel the procedure. . Sickness: If you have a cold, fever, or any active infections, call and cancel the procedure. . Arrival: You must be in the facility at least 30 minutes prior to your scheduled procedure. . Children: Do not bring children with you. . Dress appropriately: Bring dark clothing that you would not mind if they get stained. . Valuables: Do not bring any jewelry or valuables.  Procedure appointments are reserved for interventional treatments only. . No Prescription Refills. . No medication changes will be discussed during procedure  appointments. . No disability issues will be discussed.  Remember:  Regular Business hours are:  Monday to Thursday 8:00 AM to 4:00 PM  Provider's Schedule: Junko Ohagan, MD:  Procedure days: Tuesday and Thursday 7:30 AM to 4:00 PM  Bilal Lateef, MD:  Procedure days: Monday and Wednesday 7:30 AM to 4:00 PM ____________________________________________________________________________________________    

## 2017-12-29 ENCOUNTER — Ambulatory Visit: Payer: Medicaid Other | Admitting: Pain Medicine

## 2017-12-29 ENCOUNTER — Encounter: Payer: Self-pay | Admitting: Pain Medicine

## 2017-12-29 ENCOUNTER — Ambulatory Visit
Admission: RE | Admit: 2017-12-29 | Discharge: 2017-12-29 | Disposition: A | Discharge status: Home / Self Care | Payer: Medicaid Other | Source: Ambulatory Visit | Attending: Pain Medicine | Admitting: Pain Medicine

## 2017-12-29 ENCOUNTER — Other Ambulatory Visit: Payer: Self-pay

## 2017-12-29 VITALS — BP 137/78 | HR 93 | Temp 97.9°F | Resp 18 | Ht 62.0 in | Wt 220.0 lb

## 2017-12-29 DIAGNOSIS — Z7984 Long term (current) use of oral hypoglycemic drugs: Secondary | ICD-10-CM | POA: Diagnosis not present

## 2017-12-29 DIAGNOSIS — Z888 Allergy status to other drugs, medicaments and biological substances status: Secondary | ICD-10-CM | POA: Diagnosis not present

## 2017-12-29 DIAGNOSIS — G8929 Other chronic pain: Secondary | ICD-10-CM

## 2017-12-29 DIAGNOSIS — M5412 Radiculopathy, cervical region: Secondary | ICD-10-CM | POA: Diagnosis present

## 2017-12-29 DIAGNOSIS — M5030 Other cervical disc degeneration, unspecified cervical region: Secondary | ICD-10-CM | POA: Diagnosis not present

## 2017-12-29 DIAGNOSIS — M150 Primary generalized (osteo)arthritis: Secondary | ICD-10-CM

## 2017-12-29 DIAGNOSIS — Z79899 Other long term (current) drug therapy: Secondary | ICD-10-CM | POA: Insufficient documentation

## 2017-12-29 DIAGNOSIS — Z7982 Long term (current) use of aspirin: Secondary | ICD-10-CM | POA: Diagnosis not present

## 2017-12-29 DIAGNOSIS — M542 Cervicalgia: Secondary | ICD-10-CM

## 2017-12-29 DIAGNOSIS — Z880 Allergy status to penicillin: Secondary | ICD-10-CM | POA: Insufficient documentation

## 2017-12-29 DIAGNOSIS — M4722 Other spondylosis with radiculopathy, cervical region: Secondary | ICD-10-CM | POA: Diagnosis not present

## 2017-12-29 DIAGNOSIS — M159 Polyosteoarthritis, unspecified: Secondary | ICD-10-CM | POA: Insufficient documentation

## 2017-12-29 DIAGNOSIS — G894 Chronic pain syndrome: Secondary | ICD-10-CM

## 2017-12-29 MED ORDER — IOPAMIDOL (ISOVUE-M 200) INJECTION 41%
10.0000 mL | Freq: Once | INTRAMUSCULAR | Status: AC
  Administered 2017-12-29: 10 mL via EPIDURAL
  Filled 2017-12-29: qty 10

## 2017-12-29 MED ORDER — LIDOCAINE HCL 2 % IJ SOLN
20.0000 mL | Freq: Once | INTRAMUSCULAR | Status: AC
  Administered 2017-12-29: 400 mg
  Filled 2017-12-29: qty 20

## 2017-12-29 MED ORDER — DEXAMETHASONE SODIUM PHOSPHATE 10 MG/ML IJ SOLN
10.0000 mg | Freq: Once | INTRAMUSCULAR | Status: AC
Start: 2017-12-29 — End: 2017-12-29
  Administered 2017-12-29: 10 mg
  Filled 2017-12-29: qty 1

## 2017-12-29 MED ORDER — IBUPROFEN 600 MG PO TABS: 600.0000 mg | ORAL_TABLET | Freq: Every day | ORAL | 3 refills | Status: DC

## 2017-12-29 MED ORDER — ROPIVACAINE HCL 2 MG/ML IJ SOLN
1.0000 mL | Freq: Once | INTRAMUSCULAR | Status: AC
  Administered 2017-12-29: 10 mL via EPIDURAL
  Filled 2017-12-29: qty 10

## 2017-12-29 MED ORDER — MIDAZOLAM HCL 5 MG/5ML IJ SOLN
1.0000 mg | INTRAMUSCULAR | Status: DC | PRN
  Administered 2017-12-29: 2 mg via INTRAVENOUS
  Filled 2017-12-29: qty 5

## 2017-12-29 MED ORDER — SODIUM CHLORIDE 0.9% FLUSH
1.0000 mL | Freq: Once | INTRAVENOUS | Status: AC
  Administered 2017-12-29: 10 mL

## 2017-12-29 MED ORDER — FENTANYL CITRATE (PF) 100 MCG/2ML IJ SOLN
25.0000 ug | INTRAMUSCULAR | Status: DC | PRN
  Administered 2017-12-29: 50 ug via INTRAVENOUS
  Filled 2017-12-29: qty 2

## 2017-12-29 MED ORDER — LACTATED RINGERS IV SOLN
1000.0000 mL | Freq: Once | INTRAVENOUS | Status: AC
  Administered 2017-12-29: 1000 mL via INTRAVENOUS

## 2017-12-29 NOTE — Progress Notes (Signed)
Patient's Name: Morgan Patel  MRN: [id number]  Referring Provider: Delano Metz, MD  DOB: Jul 16, 1971  PCP: Steve Rattler, NP  DOS: 12/29/2017  Note by: Oswaldo Done, MD  Service setting: Ambulatory outpatient  Specialty: Interventional Pain Management  Patient type: Established  Location: [facility] (AMB) Pain Management Facility  Visit type: Interventional Procedure   Primary Reason for Visit: Interventional Pain Management Treatment. CC: Neck Pain (lower)  Procedure:       Anesthesia, Analgesia, Anxiolysis:  Type: Diagnostic, Inter-Laminar, Epidural Steroid Injection #2  Region: Posterior Cervico-thoracic Region Level: C7-T1 Laterality: Left-Sided Paramedial  Type: Moderate (Conscious) Sedation combined with Local Anesthesia Indication(s): Analgesia and Anxiety Route: Intravenous (IV) IV Access: Secured Sedation: Meaningful verbal contact was maintained at all times during the procedure  Local Anesthetic: Lidocaine 1-2%   Indications: 1. DDD (degenerative disc disease), cervical   2. Chronic cervical radicular pain (Bilateral) (L>R)   3. Cervical spondylosis   4. Chronic neck pain (Primary Area of Pain) (Bilateral) (L>R)    Pain Score: Pre-procedure: 6 /10 Post-procedure: 0-No pain/10  Pre-op Assessment:  Morgan Patel is a 47 y.o. (year old), female patient, seen today for interventional treatment. She  has no past surgical history on file. Morgan Patel has a current medication list which includes the following prescription(s): aspirin ec, atorvastatin, cyclobenzaprine, duloxetine, glipizide, glucose blood, hydralazine, ibuprofen, metformin, nitroglycerin, pregabalin, verapamil, and fluticasone, and the following Facility-Administered Medications: fentanyl and midazolam. Her primarily concern today is the Neck Pain (lower)  Initial Vital Signs:  Pulse Rate: 93 Temp: 98.3 F (36.8 C) Resp: 20 BP: (!) 145/93 SpO2: 96 %  BMI: Estimated body mass index is 40.24 kg/m  as calculated from the following:   Height as of this encounter: 5\' 2"  (1.575 m).   Weight as of this encounter: 220 lb (99.8 kg).  Risk Assessment: Allergies: Reviewed. She is allergic to amlodipine; quinapril; hydrochlorothiazide; atenolol; and penicillins.  Allergy Precautions: None required Coagulopathies: Reviewed. None identified.  Blood-thinner therapy: None at this time Active Infection(s): Reviewed. None identified. Morgan Patel is afebrile  Site Confirmation: Morgan Patel was asked to confirm the procedure and laterality before marking the site Procedure checklist: Completed Consent: Before the procedure and under the influence of no sedative(s), amnesic(s), or anxiolytics, the patient was informed of the treatment options, risks and possible complications. To fulfill our ethical and legal obligations, as recommended by the American Medical Association's Code of Ethics, I have informed the patient of my clinical impression; the nature and purpose of the treatment or procedure; the risks, benefits, and possible complications of the intervention; the alternatives, including doing nothing; the risk(s) and benefit(s) of the alternative treatment(s) or procedure(s); and the risk(s) and benefit(s) of doing nothing. The patient was provided information about the general risks and possible complications associated with the procedure. These may include, but are not limited to: failure to achieve desired goals, infection, bleeding, organ or nerve damage, allergic reactions, paralysis, and death. In addition, the patient was informed of those risks and complications associated to Spine-related procedures, such as failure to decrease pain; infection (i.e.: Meningitis, epidural or intraspinal abscess); bleeding (i.e.: epidural hematoma, subarachnoid hemorrhage, or any other type of intraspinal or peri-dural bleeding); organ or nerve damage (i.e.: Any type of peripheral nerve, nerve root, or spinal cord  injury) with subsequent damage to sensory, motor, and/or autonomic systems, resulting in permanent pain, numbness, and/or weakness of one or several areas of the body; allergic reactions; (i.e.: anaphylactic reaction); and/or death. Furthermore,  the patient was informed of those risks and complications associated with the medications. These include, but are not limited to: allergic reactions (i.e.: anaphylactic or anaphylactoid reaction(s)); adrenal axis suppression; blood sugar elevation that in diabetics may result in ketoacidosis or comma; water retention that in patients with history of congestive heart failure may result in shortness of breath, pulmonary edema, and decompensation with resultant heart failure; weight gain; swelling or edema; medication-induced neural toxicity; particulate matter embolism and blood vessel occlusion with resultant organ, and/or nervous system infarction; and/or aseptic necrosis of one or more joints. Finally, the patient was informed that Medicine is not an exact science; therefore, there is also the possibility of unforeseen or unpredictable risks and/or possible complications that may result in a catastrophic outcome. The patient indicated having understood very clearly. We have given the patient no guarantees and we have made no promises. Enough time was given to the patient to ask questions, all of which were answered to the patient's satisfaction. Morgan Patel has indicated that she wanted to continue with the procedure. Attestation: I, the ordering provider, attest that I have discussed with the patient the benefits, risks, side-effects, alternatives, likelihood of achieving goals, and potential problems during recovery for the procedure that I have provided informed consent. Date  Time: 12/29/2017 10:03 AM  Pre-Procedure Preparation:  Monitoring: As per clinic protocol. Respiration, ETCO2, SpO2, BP, heart rate and rhythm monitor placed and checked for adequate  function Safety Precautions: Patient was assessed for positional comfort and pressure points before starting the procedure. Time-out: I initiated and conducted the "Time-out" before starting the procedure, as per protocol. The patient was asked to participate by confirming the accuracy of the "Time Out" information. Verification of the correct person, site, and procedure were performed and confirmed by me, the nursing staff, and the patient. "Time-out" conducted as per Joint Commission's Universal Protocol (UP.01.01.01). Time: 1054  Description of Procedure Process:   Position: Prone with head of the table was raised to facilitate breathing. Target Area: For Epidural Steroid injections the target is the interlaminar space, initially targeting the lower border of the superior vertebral body lamina. Approach: Paramedial approach. Area Prepped: Entire PosteriorCervical Region Prepping solution: ChloraPrep (2% chlorhexidine gluconate and 70% isopropyl alcohol) Safety Precautions: Aspiration looking for blood return was conducted prior to all injections. At no point did we inject any substances, as a needle was being advanced. No attempts were made at seeking any paresthesias. Safe injection practices and needle disposal techniques used. Medications properly checked for expiration dates. SDV (single dose vial) medications used. Description of the Procedure: Protocol guidelines were followed. The procedure needle was introduced through the skin, ipsilateral to the reported pain, and advanced to the target area. Bone was contacted and the needle walked caudad, until the lamina was cleared. The epidural space was identified using "loss-of-resistance technique" with 2-3 ml of PF-NaCl (0.9% NSS), in a 5cc LOR glass syringe. Vitals:   12/29/17 1103 12/29/17 1110 12/29/17 1120 12/29/17 1130  BP: (!) 152/78 126/76 136/79 137/78  Pulse:      Resp: 16 20 (!) 21 18  Temp:  97.9 F (36.6 C)    TempSrc:  Temporal     SpO2: 93% 95% 94% 95%  Weight:      Height:        Start Time: 1054 hrs. End Time: 1101 hrs. Materials:  Needle(s) Type: Epidural needle Gauge: 17G Length: 3.5-in Medication(s): Please see orders for medications and dosing details.  Imaging Guidance (Spinal):  Type  of Imaging Technique: Fluoroscopy Guidance (Spinal) Indication(s): Assistance in needle guidance and placement for procedures requiring needle placement in or near specific anatomical locations not easily accessible without such assistance. Exposure Time: Please see nurses notes. Contrast: Before injecting any contrast, we confirmed that the patient did not have an allergy to iodine, shellfish, or radiological contrast. Once satisfactory needle placement was completed at the desired level, radiological contrast was injected. Contrast injected under live fluoroscopy. No contrast complications. See chart for type and volume of contrast used. Fluoroscopic Guidance: I was personally present during the use of fluoroscopy. "Tunnel Vision Technique" used to obtain the best possible view of the target area. Parallax error corrected before commencing the procedure. "Direction-depth-direction" technique used to introduce the needle under continuous pulsed fluoroscopy. Once target was reached, antero-posterior, oblique, and lateral fluoroscopic projection used confirm needle placement in all planes. Images permanently stored in EMR. Interpretation: I personally interpreted the imaging intraoperatively. Adequate needle placement confirmed in multiple planes. Appropriate spread of contrast into desired area was observed. No evidence of afferent or efferent intravascular uptake. No intrathecal or subarachnoid spread observed. Permanent images saved into the patient's record.  Antibiotic Prophylaxis:   Anti-infectives (From admission, onward)   None     Indication(s): None identified  Post-operative Assessment:  Post-procedure Vital  Signs:  Pulse Rate: 93 Temp: 97.9 F (36.6 C) Resp: 18 BP: 137/78 SpO2: 95 %  EBL: None  Complications: No immediate post-treatment complications observed by team, or reported by patient.  Note: The patient tolerated the entire procedure well. A repeat set of vitals were taken after the procedure and the patient was kept under observation following institutional policy, for this type of procedure. Post-procedural neurological assessment was performed, showing return to baseline, prior to discharge. The patient was provided with post-procedure discharge instructions, including a section on how to identify potential problems. Should any problems arise concerning this procedure, the patient was given instructions to immediately contact us, at any time, without hesitation. In any case, we plan to contact the patient by telephone for a follow-up status report regarding this interventional procedure.  Comments:  No additional relevant information.  Plan of Care    Imaging Orders     DG C-Arm 1-60 Min-No Report  Procedure Orders     Cervical Epidural Injection  Medications ordered for procedure: Meds ordered this encounter  Medications  . iopamidol (ISOVUE-M) 41 % intrathecal injection 10 mL    Must be Myelogram-compatible. If not available, you may substitute with a water-soluble, non-ionic, hypoallergenic, myelogram-compatible radiological contrast medium.  Marland Kitchen lidocaine (XYLOCAINE) 2 % (with pres) injection 400 mg  . midazolam (VERSED) 5 MG/5ML injection 1-2 mg    Make sure Flumazenil is available in the pyxis when using this medication. If oversedation occurs, administer 0.2 mg IV over 15 sec. If after 45 sec no response, administer 0.2 mg again over 1 min; may repeat at 1 min intervals; not to exceed 4 doses (1 mg)  . fentaNYL (SUBLIMAZE) injection 25-50 mcg    Make sure Narcan is available in the pyxis when using this medication. In the event of respiratory depression (RR< 8/min):  Titrate NARCAN (naloxone) in increments of 0.1 to 0.2 mg IV at 2-3 minute intervals, until desired degree of reversal.  . lactated ringers infusion 1,000 mL  . sodium chloride flush (NS) 0.9 % injection 1 mL  . ropivacaine (PF) 2 mg/mL (0.2%) (NAROPIN) injection 1 mL  . dexamethasone (DECADRON) injection 10 mg  . ibuprofen (ADVIL,MOTRIN) 600  MG tablet    Sig: Take 1 tablet (600 mg total) by mouth daily.    Dispense:  30 tablet    Refill:  3    Do not place medication on "Automatic Refill". Fill one day early if pharmacy is closed on scheduled refill date.   Medications administered: We administered iopamidol, lidocaine, midazolam, fentaNYL, lactated ringers, sodium chloride flush, ropivacaine (PF) 2 mg/mL (0.2%), and dexamethasone.  See the medical record for exact dosing, route, and time of administration.  New Prescriptions   No medications on file   Disposition: Discharge home  Discharge Date & Time: 12/29/2017; 1135 hrs.   Physician-requested Follow-up: Return for post-procedure eval (2 wks), w/ Dr. Laban Emperor.  Future Appointments  Date Time Provider Department Center  01/13/2018 10:15 AM Delano Metz, MD [facility] None   Primary Care Physician: Steve Rattler, NP Location: [facility] Facility Note by: Oswaldo Done, MD Date: 12/29/2017; Time: 12:07 PM  Disclaimer:  Medicine is not an Visual merchandiser. The only guarantee in medicine is that nothing is guaranteed. It is important to note that the decision to proceed with this intervention was based on the information collected from the patient. The Data and conclusions were drawn from the patient's questionnaire, the interview, and the physical examination. Because the information was provided in large part by the patient, it cannot be guaranteed that it has not been purposely or unconsciously manipulated. Every effort has been made to obtain as much relevant data as possible for this evaluation. It is  important to note that the conclusions that lead to this procedure are derived in large part from the available data. Always take into account that the treatment will also be dependent on availability of resources and existing treatment guidelines, considered by other Pain Management Practitioners as being common knowledge and practice, at the time of the intervention. For Medico-Legal purposes, it is also important to point out that variation in procedural techniques and pharmacological choices are the acceptable norm. The indications, contraindications, technique, and results of the above procedure should only be interpreted and judged by a Board-Certified Interventional Pain Specialist with extensive familiarity and expertise in the same exact procedure and technique.

## 2017-12-29 NOTE — Patient Instructions (Signed)

## 2017-12-30 ENCOUNTER — Telehealth: Payer: Self-pay

## 2017-12-30 NOTE — Telephone Encounter (Signed)
Patient states she had a headache yesterday but has gone away and feeling much better today after she has eaten. Denies needs. Instructed to call us if needed.

## 2018-01-12 DIAGNOSIS — Z09 Encounter for follow-up examination after completed treatment for conditions other than malignant neoplasm: Secondary | ICD-10-CM | POA: Insufficient documentation

## 2018-01-12 NOTE — Progress Notes (Deleted)
Patient's Name: Morgan Patel  MRN: [id number]  Referring Provider: Inge Rise, NP  DOB: 01-Mar-1971  PCP: Inge Rise, NP  DOS: 01/13/2018  Note by: Gaspar Cola, MD  Service setting: Ambulatory outpatient  Specialty: Interventional Pain Management  Location: [facility] (AMB) Pain Management Facility    Patient type: Established   Primary Reason(s) for Visit: Encounter for post-procedure evaluation of chronic illness with mild to moderate exacerbation CC: No chief complaint on file.  HPI  Morgan Patel is a 47 y.o. year old, female patient, who comes today for a post-procedure evaluation. She has Basilar artery stenosis; Coronary artery disease; DDD (degenerative disc disease), lumbar; Encounter for long-term (current) use of other high-risk medications; Encounter for tobacco use cessation counseling; Essential hypertension; Gastroesophageal reflux disease; Ischemic stroke ([place]); Lumbar facet arthropathy (Bilateral); Lumbosacral radiculopathy; Major depressive disorder with current active episode; Chronic myofascial pain; Nicotine dependence, uncomplicated; Other chronic pain; Tobacco use disorder; Type 2 diabetes mellitus with complication, without long-term current use of insulin ([place]); Visual field loss following stroke; Chronic neck pain (Primary Area of Pain) (Bilateral) (L>R); Chronic low back pain (Second Area of Pain) (Bilateral) (L>R); Chronic pain syndrome; Chronic sacroiliac joint pain (Bilateral) (L>R); Vitamin D deficiency; DDD (degenerative disc disease), cervical; Disorder of skeletal system; Pharmacologic therapy; Problems influencing health status; Chronic thoracic back pain (Bilateral) (L>R); Chronic upper extremity pain (Fourth Area of Pain) (Bilateral) (L>R); Lumbar facet syndrome (Bilateral) (L>R); Cervical spondylosis; Lumbar central spinal stenosis; Chronic lower extremity pain (Tertiary Area of Pain) (Bilateral) (L>R); Social fears; Chronic cervical radicular pain  (Bilateral) (L>R); Morbid (severe) obesity due to excess calories ([place]); Fibromyalgia syndrome; Neurogenic pain; and Osteoarthritis on their problem list. Her primarily concern today is the No chief complaint on file.  Pain Assessment: Location:     Radiating:   Onset:   Duration:   Quality:   Severity:  /10 (self-reported pain score)  Note: Reported level is compatible with observation.                         When using our objective Pain Scale, levels between 6 and 10/10 are said to belong in an emergency room, as it progressively worsens from a 6/10, described as severely limiting, requiring emergency care not usually available at an outpatient pain management facility. At a 6/10 level, communication becomes difficult and requires great effort. Assistance to reach the emergency department may be required. Facial flushing and profuse sweating along with potentially dangerous increases in heart rate and blood pressure will be evident. Effect on ADL:   Timing:   Modifying factors:    Morgan Patel comes in today for post-procedure evaluation after the treatment done on 12/29/2017.  Further details on both, my assessment(s), as well as the proposed treatment plan, please see below.  Post-Procedure Assessment  12/29/2017 Procedure: Diagnostic/therapeutic left cervical epidural steroid injection #2 under fluoroscopic guidance and IV sedation Pre-procedure pain score:  6/10 Post-procedure pain score: 0/10 (100% relief) Influential Factors: BMI:   Intra-procedural challenges: None observed.         Assessment challenges: None detected.              Reported side-effects: None.        Post-procedural adverse reactions or complications: None reported         Sedation: Sedation provided. When no sedatives are used, the analgesic levels obtained are directly associated to the effectiveness of the local anesthetics. However, when sedation is provided,  the level of analgesia obtained during the initial  1 hour following the intervention, is believed to be the result of a combination of factors. These factors may include, but are not limited to: 1. The effectiveness of the local anesthetics used. 2. The effects of the analgesic(s) and/or anxiolytic(s) used. 3. The degree of discomfort experienced by the patient at the time of the procedure. 4. The patients ability and reliability in recalling and recording the events. 5. The presence and influence of possible secondary gains and/or psychosocial factors. Reported result: Relief experienced during the 1st hour after the procedure:   (Ultra-Short Term Relief)            Interpretative annotation: Clinically appropriate result. Analgesia during this period is likely to be Local Anesthetic and/or IV Sedative (Analgesic/Anxiolytic) related.          Effects of local anesthetic: The analgesic effects attained during this period are directly associated to the localized infiltration of local anesthetics and therefore cary significant diagnostic value as to the etiological location, or anatomical origin, of the pain. Expected duration of relief is directly dependent on the pharmacodynamics of the local anesthetic used. Long-acting (4-6 hours) anesthetics used.  Reported result: Relief during the next 4 to 6 hour after the procedure:   (Short-Term Relief)            Interpretative annotation: Clinically appropriate result. Analgesia during this period is likely to be Local Anesthetic-related.          Long-term benefit: Defined as the period of time past the expected duration of local anesthetics (1 hour for short-acting and 4-6 hours for long-acting). With the possible exception of prolonged sympathetic blockade from the local anesthetics, benefits during this period are typically attributed to, or associated with, other factors such as analgesic sensory neuropraxia, antiinflammatory effects, or beneficial biochemical changes provided by agents other than the  local anesthetics.  Reported result: Extended relief following procedure:   (Long-Term Relief)            Interpretative annotation: Clinically appropriate result. Good relief. No permanent benefit expected. Inflammation plays a part in the etiology to the pain.          Current benefits: Defined as reported results that persistent at this point in time.   Analgesia: *** %            Function: Somewhat improved ROM: Somewhat improved Interpretative annotation: Recurrence of symptoms. No permanent benefit expected. Effective diagnostic intervention.          Interpretation: Results would suggest a successful diagnostic intervention.                  Plan:  Please see "Plan of Care" for details.                Laboratory Chemistry  Inflammation Markers (CRP: Acute Phase) (ESR: Chronic Phase) Lab Results  Component Value Date   CRP 0.4 05/28/2017   ESRSEDRATE 2 05/28/2017                         Renal Function Markers Lab Results  Component Value Date   BUN 14 05/28/2017   CREATININE 0.60 05/28/2017   GFRAA 126 05/28/2017   GFRNONAA 109 05/28/2017                              Hepatic Function Markers Lab Results  Component Value  Date   AST 25 05/28/2017   ALT 21 05/28/2017   ALBUMIN 4.2 05/28/2017   ALKPHOS 107 05/28/2017                        Electrolytes Lab Results  Component Value Date   NA 144 05/28/2017   K 4.6 05/28/2017   CL 107 (H) 05/28/2017   CALCIUM 10.0 05/28/2017   MG 1.8 05/28/2017                        Neuropathy Markers Lab Results  Component Value Date   VITAMINB12 700 05/28/2017                        Bone Pathology Markers Lab Results  Component Value Date   25OHVITD1 14 (L) 05/28/2017   25OHVITD2 <1.0 05/28/2017   25OHVITD3 13 05/28/2017                         Coagulation Parameters Lab Results  Component Value Date   INR 1.0 06/15/2014   LABPROT 12.8 06/15/2014   APTT 25.1 06/15/2014   PLT 281 09/20/2014                         Cardiovascular Markers Lab Results  Component Value Date   BNP 16 06/15/2014   CKMB 1.5 06/16/2014   TROPONINI < 0.02 09/20/2014   HGB 13.0 09/20/2014   HCT 39.9 09/20/2014                         Note: Lab results reviewed.  Recent Diagnostic Imaging Results  DG C-Arm 1-60 Min-No Report Fluoroscopy was utilized by the requesting physician.  No radiographic  interpretation.   Complexity Note: I personally reviewed the fluoroscopic imaging of the procedure.                        Meds   Current Outpatient Medications:  .  aspirin EC 81 MG tablet, Take 81 mg by mouth daily., Disp: , Rfl:  .  atorvastatin (LIPITOR) 80 MG tablet, Take 80 mg by mouth daily., Disp: , Rfl:  .  cyclobenzaprine (FLEXERIL) 10 MG tablet, Take 10 mg by mouth at bedtime as needed., Disp: , Rfl:  .  DULoxetine (CYMBALTA) 20 MG capsule, Take 20 mg by mouth., Disp: , Rfl:  .  fluticasone (FLONASE) 50 MCG/ACT nasal spray, Place 1 spray into both nostrils daily., Disp: , Rfl:  .  glipiZIDE (GLUCOTROL) 10 MG tablet, Take 10 mg by mouth 2 (two) times daily before a meal., Disp: , Rfl:  .  glucose blood (FREESTYLE TEST STRIPS) test strip, 1 strip as needed., Disp: , Rfl:  .  hydrALAZINE (APRESOLINE) 100 MG tablet, Take 100 mg by mouth 3 (three) times daily., Disp: , Rfl:  .  [START ON 02/02/2018] ibuprofen (ADVIL,MOTRIN) 600 MG tablet, Take 1 tablet (600 mg total) by mouth daily., Disp: 30 tablet, Rfl: 3 .  metFORMIN (GLUCOPHAGE) 500 MG tablet, Take 500 mg by mouth 2 (two) times daily with a meal., Disp: , Rfl:  .  nitroGLYCERIN (NITROSTAT) 0.4 MG SL tablet, Place 0.4 mg under the tongue as needed., Disp: , Rfl:  .  pregabalin (LYRICA) 150 MG capsule, Take 1 capsule (150 mg total) by mouth 3 (three) times daily.,  Disp: 90 capsule, Rfl: 5 .  verapamil (CALAN-SR) 240 MG CR tablet, Take 240 mg by mouth 2 (two) times daily., Disp: , Rfl:   ROS  Constitutional: Denies any fever or chills Gastrointestinal: No reported  hemesis, hematochezia, vomiting, or acute GI distress Musculoskeletal: Denies any acute onset joint swelling, redness, loss of ROM, or weakness Neurological: No reported episodes of acute onset apraxia, aphasia, dysarthria, agnosia, amnesia, paralysis, loss of coordination, or loss of consciousness  Allergies  Morgan Patel is allergic to amlodipine; quinapril; hydrochlorothiazide; atenolol; and penicillins.  [place]  Drug: Morgan Patel  reports that she does not use drugs. Alcohol:  reports that she does not drink alcohol. Tobacco:  reports that she has been smoking cigarettes.  She has been smoking about 0.50 packs per day. She has never used smokeless tobacco. Medical:  has a past medical history of Abscess of breast, right (02/04/2015), DDD (degenerative disc disease), lumbar, Diabetes mellitus without complication ([place]), Hyperlipidemia, Hypertension, MI (myocardial infarction) ([place]) (2015), and Stroke ([place]) (2017). Surgical: Morgan Patel  has no past surgical history on file. Family: family history includes Cancer in her brother; Diabetes in her mother; Hypertension in her father and mother; Osteoarthritis in her father and mother; Stroke in her father.  Constitutional Exam  General appearance: Well nourished, well developed, and well hydrated. In no apparent acute distress There were no vitals filed for this visit. BMI Assessment: Estimated body mass index is 40.24 kg/m as calculated from the following:   Height as of 12/29/17: 5' 2"  (1.575 m).   Weight as of 12/29/17: 220 lb (99.8 kg).  BMI interpretation table: BMI level Category Range association with higher incidence of chronic pain  <18 kg/m2 Underweight   18.5-24.9 kg/m2 Ideal body weight   25-29.9 kg/m2 Overweight Increased incidence by 20%  30-34.9 kg/m2 Obese (Class I) Increased incidence by 68%  35-39.9 kg/m2 Severe obesity (Class II) Increased incidence by 136%  >40 kg/m2 Extreme obesity (Class III) Increased incidence by 254%    BMI Readings from Last 4 Encounters:  12/29/17 40.24 kg/m  12/02/17 52.54 kg/m  09/08/17 42.07 kg/m  08/26/17 45.73 kg/m   Wt Readings from Last 4 Encounters:  12/29/17 220 lb (99.8 kg)  12/02/17 269 lb (122 kg)  09/08/17 230 lb (104.3 kg)  08/26/17 250 lb (113.4 kg)  Psych/Mental status: Alert, oriented x 3 (person, place, & time)       Eyes: PERLA Respiratory: No evidence of acute respiratory distress  Cervical Spine Area Exam  Skin & Axial Inspection: No masses, redness, edema, swelling, or associated skin lesions Alignment: Symmetrical Functional ROM: Unrestricted ROM      Stability: No instability detected Muscle Tone/Strength: Functionally intact. No obvious neuro-muscular anomalies detected. Sensory (Neurological): Unimpaired Palpation: No palpable anomalies              Upper Extremity (UE) Exam    Side: Right upper extremity  Side: Left upper extremity  Skin & Extremity Inspection: Skin color, temperature, and hair growth are WNL. No peripheral edema or cyanosis. No masses, redness, swelling, asymmetry, or associated skin lesions. No contractures.  Skin & Extremity Inspection: Skin color, temperature, and hair growth are WNL. No peripheral edema or cyanosis. No masses, redness, swelling, asymmetry, or associated skin lesions. No contractures.  Functional ROM: Unrestricted ROM          Functional ROM: Unrestricted ROM          Muscle Tone/Strength: Functionally intact. No obvious neuro-muscular anomalies detected.  Muscle Tone/Strength:  Functionally intact. No obvious neuro-muscular anomalies detected.  Sensory (Neurological): Unimpaired          Sensory (Neurological): Unimpaired          Palpation: No palpable anomalies              Palpation: No palpable anomalies              Specialized Test(s): Deferred         Specialized Test(s): Deferred          Thoracic Spine Area Exam  Skin & Axial Inspection: No masses, redness, or swelling Alignment:  Symmetrical Functional ROM: Unrestricted ROM Stability: No instability detected Muscle Tone/Strength: Functionally intact. No obvious neuro-muscular anomalies detected. Sensory (Neurological): Unimpaired Muscle strength & Tone: No palpable anomalies  Lumbar Spine Area Exam  Skin & Axial Inspection: No masses, redness, or swelling Alignment: Symmetrical Functional ROM: Unrestricted ROM       Stability: No instability detected Muscle Tone/Strength: Functionally intact. No obvious neuro-muscular anomalies detected. Sensory (Neurological): Unimpaired Palpation: No palpable anomalies       Provocative Tests: Lumbar Hyperextension and rotation test: evaluation deferred today       Lumbar Lateral bending test: evaluation deferred today       Patrick's Maneuver: evaluation deferred today                    Gait & Posture Assessment  Ambulation: Unassisted Gait: Relatively normal for age and body habitus Posture: WNL   Lower Extremity Exam    Side: Right lower extremity  Side: Left lower extremity  Skin & Extremity Inspection: Skin color, temperature, and hair growth are WNL. No peripheral edema or cyanosis. No masses, redness, swelling, asymmetry, or associated skin lesions. No contractures.  Skin & Extremity Inspection: Skin color, temperature, and hair growth are WNL. No peripheral edema or cyanosis. No masses, redness, swelling, asymmetry, or associated skin lesions. No contractures.  Functional ROM: Unrestricted ROM          Functional ROM: Unrestricted ROM          Muscle Tone/Strength: Functionally intact. No obvious neuro-muscular anomalies detected.  Muscle Tone/Strength: Functionally intact. No obvious neuro-muscular anomalies detected.  Sensory (Neurological): Unimpaired  Sensory (Neurological): Unimpaired  Palpation: No palpable anomalies  Palpation: No palpable anomalies   Assessment  Primary Diagnosis & Pertinent Problem List: The primary encounter diagnosis was Chronic  cervical radicular pain (Bilateral) (L>R). Diagnoses of Chronic neck pain (Primary Area of Pain) (Bilateral) (L>R), Chronic upper extremity pain (Fourth Area of Pain) (Bilateral) (L>R), Chronic thoracic back pain (Bilateral) (L>R), and DDD (degenerative disc disease), cervical were also pertinent to this visit.  Status Diagnosis  Controlled Controlled Controlled 1. Chronic cervical radicular pain (Bilateral) (L>R)   2. Chronic neck pain (Primary Area of Pain) (Bilateral) (L>R)   3. Chronic upper extremity pain (Fourth Area of Pain) (Bilateral) (L>R)   4. Chronic thoracic back pain (Bilateral) (L>R)   5. DDD (degenerative disc disease), cervical     Problems updated and reviewed during this visit: No problems updated. Plan of Care  Pharmacotherapy (Medications Ordered): No orders of the defined types were placed in this encounter.  Medications administered today: Alicyn D. Skidgel had no medications administered during this visit.  Procedure Orders    No procedure(s) ordered today   Lab Orders  No laboratory test(s) ordered today   Imaging Orders  No imaging studies ordered today   Referral Orders  No referral(s) requested today  Interventional management options: Planned, scheduled, and/or pending:   Therapeutic left-sided CESI #3 under fluoroscopic guidance and IV sedation   Considering:   Diagnosticleft sided cervical epidural steroid injection Diagnosticleft-sided cervical facet block DiagnosticLeft sided Cervical radiofrequency ablation Diagnosticleft-sided thoracic facet block Diagnosticbilateral lumbar epidural steroid injection Diagnosticbilateral lumbar facet  Possible bilateral radiofrequency ablation   Palliative PRN treatment(s):   Palliative bilateral lumbar facet block #2 Palliative left cervical epidural steroid injection #3   Provider-requested follow-up: No follow-ups on file.  Future Appointments  Date Time Provider [place]   01/13/2018 10:15 AM Milinda Pointer, MD [place] None   Primary Care Physician: Inge Rise, NP Location: [facility] Note by: Gaspar Cola, MD Date: 01/13/2018; Time: 2:02 PM

## 2018-01-13 ENCOUNTER — Ambulatory Visit: Payer: Medicaid Other | Admitting: Pain Medicine

## 2018-02-21 NOTE — Progress Notes (Deleted)
Patient's Name: Morgan Patel  MRN: [id number]  Referring Provider: Inge Rise, NP  DOB: October 12, 1970  PCP: Inge Rise, NP  DOS: 02/22/2018  Note by: Gaspar Cola, MD  Service setting: Ambulatory outpatient  Specialty: Interventional Pain Management  Location: [facility] (AMB) Pain Management Facility    Patient type: Established   Primary Reason(s) for Visit: Encounter for post-procedure evaluation of chronic illness with mild to moderate exacerbation CC: No chief complaint on file.  HPI  [name] is a 47 y.o. year old, female patient, who comes today for a post-procedure evaluation. She has Basilar artery stenosis; Coronary artery disease; DDD (degenerative disc disease), lumbar; Encounter for long-term (current) use of other high-risk medications; Encounter for tobacco use cessation counseling; Essential hypertension; Gastroesophageal reflux disease; Ischemic stroke ([place]); Lumbar facet arthropathy (Bilateral); Lumbosacral radiculopathy; Major depressive disorder with current active episode; Chronic myofascial pain; Nicotine dependence, uncomplicated; Other chronic pain; Tobacco use disorder; Type 2 diabetes mellitus with complication, without long-term current use of insulin ([place]); Visual field loss following stroke; Chronic neck pain (Primary Area of Pain) (Bilateral) (L>R); Chronic low back pain (Second Area of Pain) (Bilateral) (L>R); Chronic pain syndrome; Chronic sacroiliac joint pain (Bilateral) (L>R); Vitamin D deficiency; DDD (degenerative disc disease), cervical; Disorder of skeletal system; Pharmacologic therapy; Problems influencing health status; Chronic thoracic back pain (Bilateral) (L>R); Chronic upper extremity pain (Fourth Area of Pain) (Bilateral) (L>R); Lumbar facet syndrome (Bilateral) (L>R); Cervical spondylosis; Lumbar central spinal stenosis; Chronic lower extremity pain (Tertiary Area of Pain) (Bilateral) (L>R); Social fears; Chronic cervical radicular pain  (Bilateral) (L>R); Morbid (severe) obesity due to excess calories ([place]); Fibromyalgia syndrome; Neurogenic pain; and Osteoarthritis on their problem list. Her primarily concern today is the No chief complaint on file.  Pain Assessment: Location:     Radiating:   Onset:   Duration:   Quality:   Severity:  /10 (subjective, self-reported pain score)  Note: Reported level is compatible with observation.                         When using our objective Pain Scale, levels between 6 and 10/10 are said to belong in an emergency room, as it progressively worsens from a 6/10, described as severely limiting, requiring emergency care not usually available at an outpatient pain management facility. At a 6/10 level, communication becomes difficult and requires great effort. Assistance to reach the emergency department may be required. Facial flushing and profuse sweating along with potentially dangerous increases in heart rate and blood pressure will be evident. Effect on ADL:   Timing:   Modifying factors:   BP:    HR:    [name] comes in today for post-procedure evaluation after the treatment done on 01/13/2018.  Further details on both, my assessment(s), as well as the proposed treatment plan, please see below.  Post-Procedure Assessment  01/13/2018 Procedure: Therapeutic left-sided CESI #2 under fluoroscopic guidance and IV sedation Pre-procedure pain score:  6/10 Post-procedure pain score: 0/10 (100% relief) Influential Factors: BMI:   Intra-procedural challenges: None observed.         Assessment challenges: None detected.              Reported side-effects: None.        Post-procedural adverse reactions or complications: None reported         Sedation: Sedation provided. When no sedatives are used, the analgesic levels obtained are directly associated to the effectiveness of the local anesthetics.  However, when sedation is provided, the level of analgesia obtained during the initial 1 hour  following the intervention, is believed to be the result of a combination of factors. These factors may include, but are not limited to: 1. The effectiveness of the local anesthetics used. 2. The effects of the analgesic(s) and/or anxiolytic(s) used. 3. The degree of discomfort experienced by the patient at the time of the procedure. 4. The patients ability and reliability in recalling and recording the events. 5. The presence and influence of possible secondary gains and/or psychosocial factors. Reported result: Relief experienced during the 1st hour after the procedure:   (Ultra-Short Term Relief)            Interpretative annotation: Clinically appropriate result. Analgesia during this period is likely to be Local Anesthetic and/or IV Sedative (Analgesic/Anxiolytic) related.          Effects of local anesthetic: The analgesic effects attained during this period are directly associated to the localized infiltration of local anesthetics and therefore cary significant diagnostic value as to the etiological location, or anatomical origin, of the pain. Expected duration of relief is directly dependent on the pharmacodynamics of the local anesthetic used. Long-acting (4-6 hours) anesthetics used.  Reported result: Relief during the next 4 to 6 hour after the procedure:   (Short-Term Relief)            Interpretative annotation: Clinically appropriate result. Analgesia during this period is likely to be Local Anesthetic-related.          Long-term benefit: Defined as the period of time past the expected duration of local anesthetics (1 hour for short-acting and 4-6 hours for long-acting). With the possible exception of prolonged sympathetic blockade from the local anesthetics, benefits during this period are typically attributed to, or associated with, other factors such as analgesic sensory neuropraxia, antiinflammatory effects, or beneficial biochemical changes provided by agents other than the local  anesthetics.  Reported result: Extended relief following procedure:   (Long-Term Relief)            Interpretative annotation: Clinically appropriate result. Good relief. No permanent benefit expected. Inflammation plays a part in the etiology to the pain.          Current benefits: Defined as reported results that persistent at this point in time.   Analgesia: *** %            Function: Somewhat improved ROM: Somewhat improved Interpretative annotation: Recurrence of symptoms. No permanent benefit expected. Effective diagnostic intervention.          Interpretation: Results would suggest a successful diagnostic intervention.                  Plan:  Please see "Plan of Care" for details.                Laboratory Chemistry  Inflammation Markers (CRP: Acute Phase) (ESR: Chronic Phase) Lab Results  Component Value Date   CRP 0.4 05/28/2017   ESRSEDRATE 2 05/28/2017                         Renal Markers Lab Results  Component Value Date   BUN 14 05/28/2017   CREATININE 0.60 05/28/2017   BCR 23 05/28/2017   GFRAA 126 05/28/2017   GFRNONAA 109 05/28/2017  Hepatic Markers Lab Results  Component Value Date   AST 25 05/28/2017   ALT 21 05/28/2017   ALBUMIN 4.2 05/28/2017                        Neuropathy Markers No results found.  Hematology Parameters Lab Results  Component Value Date   INR 1.0 06/15/2014   LABPROT 12.8 06/15/2014   APTT 25.1 06/15/2014   PLT 281 09/20/2014   HGB 13.0 09/20/2014   HCT 39.9 09/20/2014                        CV Markers Lab Results  Component Value Date   BNP 16 06/15/2014   CKMB 1.5 06/16/2014   TROPONINI < 0.02 09/20/2014                         Note: Lab results reviewed.  Recent Diagnostic Imaging Results  DG C-Arm 1-60 Min-No Report Fluoroscopy was utilized by the requesting physician.  No radiographic  interpretation.   Complexity Note: I personally reviewed the fluoroscopic imaging of the  procedure.                        Meds   Current Outpatient Medications:  .  aspirin EC 81 MG tablet, Take 81 mg by mouth daily., Disp: , Rfl:  .  atorvastatin (LIPITOR) 80 MG tablet, Take 80 mg by mouth daily., Disp: , Rfl:  .  cyclobenzaprine (FLEXERIL) 10 MG tablet, Take 10 mg by mouth at bedtime as needed., Disp: , Rfl:  .  DULoxetine (CYMBALTA) 20 MG capsule, Take 20 mg by mouth., Disp: , Rfl:  .  fluticasone (FLONASE) 50 MCG/ACT nasal spray, Place 1 spray into both nostrils daily., Disp: , Rfl:  .  glipiZIDE (GLUCOTROL) 10 MG tablet, Take 10 mg by mouth 2 (two) times daily before a meal., Disp: , Rfl:  .  glucose blood (FREESTYLE TEST STRIPS) test strip, 1 strip as needed., Disp: , Rfl:  .  hydrALAZINE (APRESOLINE) 100 MG tablet, Take 100 mg by mouth 3 (three) times daily., Disp: , Rfl:  .  ibuprofen (ADVIL,MOTRIN) 600 MG tablet, Take 1 tablet (600 mg total) by mouth daily., Disp: 30 tablet, Rfl: 3 .  metFORMIN (GLUCOPHAGE) 500 MG tablet, Take 500 mg by mouth 2 (two) times daily with a meal., Disp: , Rfl:  .  nitroGLYCERIN (NITROSTAT) 0.4 MG SL tablet, Place 0.4 mg under the tongue as needed., Disp: , Rfl:  .  pregabalin (LYRICA) 150 MG capsule, Take 1 capsule (150 mg total) by mouth 3 (three) times daily., Disp: 90 capsule, Rfl: 5 .  verapamil (CALAN-SR) 240 MG CR tablet, Take 240 mg by mouth 2 (two) times daily., Disp: , Rfl:   ROS  Constitutional: Denies any fever or chills Gastrointestinal: No reported hemesis, hematochezia, vomiting, or acute GI distress Musculoskeletal: Denies any acute onset joint swelling, redness, loss of ROM, or weakness Neurological: No reported episodes of acute onset apraxia, aphasia, dysarthria, agnosia, amnesia, paralysis, loss of coordination, or loss of consciousness  Allergies  Ms. Markiewicz is allergic to amlodipine; quinapril; hydrochlorothiazide; atenolol; and penicillins.  [place]  Drug: Ms. Stelzner  reports that she does not use drugs. Alcohol:   reports that she does not drink alcohol. Tobacco:  reports that she has been smoking cigarettes.  She has been smoking about 0.50 packs per day. She  has never used smokeless tobacco. Medical:  has a past medical history of Abscess of breast, right (02/04/2015), DDD (degenerative disc disease), lumbar, Diabetes mellitus without complication ([place]), Hyperlipidemia, Hypertension, MI (myocardial infarction) ([place]) (2015), and Stroke ([place]) (2017). Surgical: Ms. Gradillas  has no past surgical history on file. Family: family history includes Cancer in her brother; Diabetes in her mother; Hypertension in her father and mother; Osteoarthritis in her father and mother; Stroke in her father.  Constitutional Exam  General appearance: Well nourished, well developed, and well hydrated. In no apparent acute distress There were no vitals filed for this visit. BMI Assessment: Estimated body mass index is 40.24 kg/m as calculated from the following:   Height as of 12/29/17: 5' 2" (1.575 m).   Weight as of 12/29/17: 220 lb (99.8 kg).  BMI interpretation table: BMI level Category Range association with higher incidence of chronic pain  <18 kg/m2 Underweight   18.5-24.9 kg/m2 Ideal body weight   25-29.9 kg/m2 Overweight Increased incidence by 20%  30-34.9 kg/m2 Obese (Class I) Increased incidence by 68%  35-39.9 kg/m2 Severe obesity (Class II) Increased incidence by 136%  >40 kg/m2 Extreme obesity (Class III) Increased incidence by 254%   Patient's current BMI Ideal Body weight  There is no height or weight on file to calculate BMI. Patient weight not recorded   BMI Readings from Last 4 Encounters:  12/29/17 40.24 kg/m  12/02/17 52.54 kg/m  09/08/17 42.07 kg/m  08/26/17 45.73 kg/m   Wt Readings from Last 4 Encounters:  12/29/17 220 lb (99.8 kg)  12/02/17 269 lb (122 kg)  09/08/17 230 lb (104.3 kg)  08/26/17 250 lb (113.4 kg)  Psych/Mental status: Alert, oriented x 3 (person, place, & time)       Eyes:  PERLA Respiratory: No evidence of acute respiratory distress  Cervical Spine Area Exam  Skin & Axial Inspection: No masses, redness, edema, swelling, or associated skin lesions Alignment: Symmetrical Functional ROM: Unrestricted ROM      Stability: No instability detected Muscle Tone/Strength: Functionally intact. No obvious neuro-muscular anomalies detected. Sensory (Neurological): Unimpaired Palpation: No palpable anomalies              Upper Extremity (UE) Exam    Side: Right upper extremity  Side: Left upper extremity  Skin & Extremity Inspection: Skin color, temperature, and hair growth are WNL. No peripheral edema or cyanosis. No masses, redness, swelling, asymmetry, or associated skin lesions. No contractures.  Skin & Extremity Inspection: Skin color, temperature, and hair growth are WNL. No peripheral edema or cyanosis. No masses, redness, swelling, asymmetry, or associated skin lesions. No contractures.  Functional ROM: Unrestricted ROM          Functional ROM: Unrestricted ROM          Muscle Tone/Strength: Functionally intact. No obvious neuro-muscular anomalies detected.  Muscle Tone/Strength: Functionally intact. No obvious neuro-muscular anomalies detected.  Sensory (Neurological): Unimpaired          Sensory (Neurological): Unimpaired          Palpation: No palpable anomalies              Palpation: No palpable anomalies              Provocative Test(s):  Phalen's test: deferred Tinel's test: deferred Apley's scratch test (touch opposite shoulder):  Action 1 (Across chest): deferred Action 2 (Overhead): deferred Action 3 (LB reach): deferred   Provocative Test(s):  Phalen's test: deferred Tinel's test: deferred Apley's scratch test (touch opposite  shoulder):  Action 1 (Across chest): deferred Action 2 (Overhead): deferred Action 3 (LB reach): deferred    Thoracic Spine Area Exam  Skin & Axial Inspection: No masses, redness, or swelling Alignment:  Symmetrical Functional ROM: Unrestricted ROM Stability: No instability detected Muscle Tone/Strength: Functionally intact. No obvious neuro-muscular anomalies detected. Sensory (Neurological): Unimpaired Muscle strength & Tone: No palpable anomalies  Lumbar Spine Area Exam  Skin & Axial Inspection: No masses, redness, or swelling Alignment: Symmetrical Functional ROM: Unrestricted ROM       Stability: No instability detected Muscle Tone/Strength: Functionally intact. No obvious neuro-muscular anomalies detected. Sensory (Neurological): Unimpaired Palpation: No palpable anomalies       Provocative Tests: Lumbar Hyperextension/rotation test: deferred today       Lumbar quadrant test (Kemp's test): deferred today       Lumbar Lateral bending test: deferred today       Patrick's Maneuver: deferred today                   FABER test: deferred today       Thigh-thrust test: deferred today       S-I compression test: deferred today       S-I distraction test: deferred today        Gait & Posture Assessment  Ambulation: Unassisted Gait: Relatively normal for age and body habitus Posture: WNL   Lower Extremity Exam    Side: Right lower extremity  Side: Left lower extremity  Stability: No instability observed          Stability: No instability observed          Skin & Extremity Inspection: Skin color, temperature, and hair growth are WNL. No peripheral edema or cyanosis. No masses, redness, swelling, asymmetry, or associated skin lesions. No contractures.  Skin & Extremity Inspection: Skin color, temperature, and hair growth are WNL. No peripheral edema or cyanosis. No masses, redness, swelling, asymmetry, or associated skin lesions. No contractures.  Functional ROM: Unrestricted ROM                  Functional ROM: Unrestricted ROM                  Muscle Tone/Strength: Functionally intact. No obvious neuro-muscular anomalies detected.  Muscle Tone/Strength: Functionally intact. No  obvious neuro-muscular anomalies detected.  Sensory (Neurological): Unimpaired  Sensory (Neurological): Unimpaired  Palpation: No palpable anomalies  Palpation: No palpable anomalies   Assessment  Primary Diagnosis & Pertinent Problem List: The primary encounter diagnosis was Chronic neck pain (Primary Area of Pain) (Bilateral) (L>R). Diagnoses of Chronic low back pain (Second Area of Pain) (Bilateral) (L>R), Chronic lower extremity pain (Tertiary Area of Pain) (Bilateral) (L>R), Chronic upper extremity pain (Fourth Area of Pain) (Bilateral) (L>R), and Chronic cervical radicular pain (Bilateral) (L>R) were also pertinent to this visit.  Status Diagnosis  Controlled Controlled Controlled 1. Chronic neck pain (Primary Area of Pain) (Bilateral) (L>R)   2. Chronic low back pain (Second Area of Pain) (Bilateral) (L>R)   3. Chronic lower extremity pain (Tertiary Area of Pain) (Bilateral) (L>R)   4. Chronic upper extremity pain (Fourth Area of Pain) (Bilateral) (L>R)   5. Chronic cervical radicular pain (Bilateral) (L>R)     Problems updated and reviewed during this visit: No problems updated. Plan of Care  Pharmacotherapy (Medications Ordered): No orders of the defined types were placed in this encounter.  Medications administered today: [place] D. Ramer had no medications administered during this visit.  Procedure  Orders    No procedure(s) ordered today   Lab Orders  No laboratory test(s) ordered today   Imaging Orders  No imaging studies ordered today   Referral Orders  No referral(s) requested today    Interventional management options: Planned, scheduled, and/or pending:   ***   Considering:   Diagnosticleft sided cervical epidural steroid injection Diagnosticleft-sided cervical facet block DiagnosticLeft sided Cervical radiofrequency ablation Diagnosticleft-sided thoracic facet block Diagnosticbilateral lumbar epidural steroid injection Diagnosticbilateral  lumbar facet  Possible bilateral radiofrequency ablation   Palliative PRN treatment(s):   Palliative bilateral lumbar facet block #2 Palliative left cervical epidural steroid injection #3   Provider-requested follow-up: No follow-ups on file.  Future Appointments  Date Time Provider [place]  02/22/2018  1:15 PM Milinda Pointer, MD [facility] [place] None   Primary Care Physician: Inge Rise, NP Location: [facility] Note by: Gaspar Cola, MD Date: 02/22/2018; Time: 7:59 PM

## 2018-02-22 ENCOUNTER — Ambulatory Visit: Payer: Medicaid Other | Admitting: Pain Medicine

## 2018-11-07 ENCOUNTER — Other Ambulatory Visit: Payer: Self-pay

## 2018-11-07 ENCOUNTER — Emergency Department
Admission: EM | Admit: 2018-11-07 | Discharge: 2018-11-07 | Disposition: A | Discharge status: Home / Self Care | Payer: Medicare Other | Attending: Emergency Medicine | Admitting: Emergency Medicine

## 2018-11-07 DIAGNOSIS — T148XXA Other injury of unspecified body region, initial encounter: Secondary | ICD-10-CM

## 2018-11-07 DIAGNOSIS — Z79899 Other long term (current) drug therapy: Secondary | ICD-10-CM | POA: Insufficient documentation

## 2018-11-07 DIAGNOSIS — E119 Type 2 diabetes mellitus without complications: Secondary | ICD-10-CM | POA: Diagnosis not present

## 2018-11-07 DIAGNOSIS — E785 Hyperlipidemia, unspecified: Secondary | ICD-10-CM | POA: Insufficient documentation

## 2018-11-07 DIAGNOSIS — S30811A Abrasion of abdominal wall, initial encounter: Secondary | ICD-10-CM | POA: Insufficient documentation

## 2018-11-07 DIAGNOSIS — I10 Essential (primary) hypertension: Secondary | ICD-10-CM | POA: Diagnosis not present

## 2018-11-07 DIAGNOSIS — F10920 Alcohol use, unspecified with intoxication, uncomplicated: Secondary | ICD-10-CM | POA: Diagnosis not present

## 2018-11-07 DIAGNOSIS — Y92014 Private driveway to single-family (private) house as the place of occurrence of the external cause: Secondary | ICD-10-CM | POA: Insufficient documentation

## 2018-11-07 DIAGNOSIS — Z23 Encounter for immunization: Secondary | ICD-10-CM | POA: Diagnosis not present

## 2018-11-07 DIAGNOSIS — E876 Hypokalemia: Secondary | ICD-10-CM | POA: Diagnosis not present

## 2018-11-07 DIAGNOSIS — Y93I9 Activity, other involving external motion: Secondary | ICD-10-CM | POA: Diagnosis not present

## 2018-11-07 DIAGNOSIS — F17210 Nicotine dependence, cigarettes, uncomplicated: Secondary | ICD-10-CM | POA: Diagnosis not present

## 2018-11-07 DIAGNOSIS — Y998 Other external cause status: Secondary | ICD-10-CM | POA: Diagnosis not present

## 2018-11-07 LAB — CBC WITH DIFFERENTIAL/PLATELET
Abs Immature Granulocytes: 0.06 10*3/uL (ref 0.00–0.07)
BASOS PCT: 0 %
Basophils Absolute: 0 10*3/uL (ref 0.0–0.1)
Eosinophils Absolute: 0.1 10*3/uL (ref 0.0–0.5)
Eosinophils Relative: 1 %
HCT: 40.5 % (ref 36.0–46.0)
Hemoglobin: 13 g/dL (ref 12.0–15.0)
Immature Granulocytes: 1 %
Lymphocytes Relative: 26 %
Lymphs Abs: 3.2 10*3/uL (ref 0.7–4.0)
MCH: 30.2 pg (ref 26.0–34.0)
MCHC: 32.1 g/dL (ref 30.0–36.0)
MCV: 94 fL (ref 80.0–100.0)
Monocytes Absolute: 0.8 10*3/uL (ref 0.1–1.0)
Monocytes Relative: 7 %
NEUTROS PCT: 65 %
Neutro Abs: 8.1 10*3/uL — ABNORMAL HIGH (ref 1.7–7.7)
Platelets: 283 10*3/uL (ref 150–400)
RBC: 4.31 MIL/uL (ref 3.87–5.11)
RDW: 14.2 % (ref 11.5–15.5)
WBC: 12.3 10*3/uL — ABNORMAL HIGH (ref 4.0–10.5)
nRBC: 0 % (ref 0.0–0.2)

## 2018-11-07 LAB — BASIC METABOLIC PANEL
Anion gap: 11 (ref 5–15)
BUN: 17 mg/dL (ref 6–20)
CO2: 22 mmol/L (ref 22–32)
Calcium: 9.3 mg/dL (ref 8.9–10.3)
Chloride: 107 mmol/L (ref 98–111)
Creatinine, Ser: 0.7 mg/dL (ref 0.44–1.00)
GFR calc Af Amer: >60 mL/min (ref >60)
Glucose, Bld: 114 mg/dL — ABNORMAL HIGH (ref 70–99)
Potassium: 2.6 mmol/L — CL (ref 3.5–5.1)
SODIUM: 140 mmol/L (ref 135–145)

## 2018-11-07 LAB — ETHANOL: Alcohol, Ethyl (B): 148 mg/dL — ABNORMAL HIGH (ref <10)

## 2018-11-07 MED ORDER — SODIUM CHLORIDE 0.9 % IV BOLUS
1000.0000 mL | Freq: Once | INTRAVENOUS | Status: AC
  Administered 2018-11-07: 1000 mL via INTRAVENOUS

## 2018-11-07 MED ORDER — POTASSIUM CHLORIDE CRYS ER 20 MEQ PO TBCR
60.0000 meq | EXTENDED_RELEASE_TABLET | Freq: Once | ORAL | Status: AC
  Administered 2018-11-07: 60 meq via ORAL
  Filled 2018-11-07: qty 3

## 2018-11-07 MED ORDER — TETANUS-DIPHTH-ACELL PERTUSSIS 5-2.5-18.5 LF-MCG/0.5 IM SUSP
0.5000 mL | Freq: Once | INTRAMUSCULAR | Status: AC
  Administered 2018-11-07: 0.5 mL via INTRAMUSCULAR
  Filled 2018-11-07: qty 0.5

## 2018-11-07 MED ORDER — BACITRACIN ZINC 500 UNIT/GM EX OINT
TOPICAL_OINTMENT | Freq: Once | CUTANEOUS | Status: AC
  Administered 2018-11-07: 06:00:00 via TOPICAL
  Filled 2018-11-07: qty 5.4
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
## 2018-11-07 NOTE — ED Notes (Signed)
Critical potassium of 2.6 called from lab. Dr. Dolores Frame notified.

## 2018-11-07 NOTE — ED Notes (Signed)
John, rn in to attempt ultrasound guided iv initiation.

## 2018-11-07 NOTE — ED Notes (Signed)
Pt states she wants to leave. Daughter here to pick pt up.

## 2018-11-07 NOTE — ED Notes (Signed)
Abrasions cleansed, bacitracin ointment applied. Informed pt multiple times that will apply dressing when she is ready to be discharged. Pt moving around in bed too much to apply dressing.

## 2018-11-07 NOTE — Discharge Instructions (Addendum)
Apply Neosporin to affected areas twice daily x5 days.  Return to the ER for worsening symptoms, persistent vomiting, difficulty breathing or other concerns.
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
## 2018-12-31 DIAGNOSIS — J309 Allergic rhinitis, unspecified: Secondary | ICD-10-CM | POA: Insufficient documentation

## 2019-02-08 DIAGNOSIS — L259 Unspecified contact dermatitis, unspecified cause: Secondary | ICD-10-CM | POA: Insufficient documentation

## 2019-06-22 ENCOUNTER — Other Ambulatory Visit: Payer: Self-pay | Admitting: Physical Medicine and Rehabilitation

## 2019-06-22 DIAGNOSIS — M48061 Spinal stenosis, lumbar region without neurogenic claudication: Secondary | ICD-10-CM

## 2019-07-07 DIAGNOSIS — E538 Deficiency of other specified B group vitamins: Secondary | ICD-10-CM | POA: Insufficient documentation

## 2019-07-07 DIAGNOSIS — J4520 Mild intermittent asthma, uncomplicated: Secondary | ICD-10-CM | POA: Insufficient documentation

## 2019-07-12 ENCOUNTER — Ambulatory Visit
Admission: RE | Admit: 2019-07-12 | Discharge: 2019-07-12 | Disposition: A | Discharge status: Home / Self Care | Payer: Medicare Other | Source: Ambulatory Visit | Attending: Physical Medicine and Rehabilitation | Admitting: Physical Medicine and Rehabilitation

## 2019-07-12 ENCOUNTER — Other Ambulatory Visit: Payer: Self-pay

## 2019-07-12 DIAGNOSIS — M48061 Spinal stenosis, lumbar region without neurogenic claudication: Secondary | ICD-10-CM

## 2020-03-15 DIAGNOSIS — Z6841 Body Mass Index (BMI) 40.0 and over, adult: Secondary | ICD-10-CM | POA: Insufficient documentation

## 2020-03-15 DIAGNOSIS — M48061 Spinal stenosis, lumbar region without neurogenic claudication: Secondary | ICD-10-CM | POA: Insufficient documentation

## 2020-03-16 ENCOUNTER — Other Ambulatory Visit: Payer: Self-pay | Admitting: Neurological Surgery

## 2020-04-11 NOTE — Progress Notes (Signed)
Your procedure is scheduled on Monday, July 26th.  Report to [facility] Main Entrance "A" at 12:25 P.M., and check in at the Admitting office.  Call this number if you have problems the morning of surgery:  [phone]  Call [phone] if you have any questions prior to your surgery date Monday-Friday 8am-4pm   Remember:  Do not eat or drink after midnight the night before your surgery    Take these medicines the morning of surgery with A SIP OF WATER  atorvastatin (LIPITOR) DULoxetine (CYMBALTA)  fexofenadine (ALLEGRA)  hydrALAZINE (APRESOLINE)  pregabalin (LYRICA)   If needed - cyclobenzaprine (FLEXERIL), fluticasone (FLONASE)/nasal spray, nitroGLYCERIN (NITROSTAT)  Follow your surgeon's instructions on when to stop Aspirin.  If no instructions were given by your surgeon then you will need to call the office to get those instructions.    As of today, STOP taking Aspirin-Caffeine (BAYER BACK & BODY PO) Aleve, Naproxen, Ibuprofen, Motrin, Advil, Goody's, BC's, all herbal medications, fish oil, and all vitamins.        WHAT DO I DO ABOUT MY DIABETES MEDICATION?  ---The morning of surgry--- Do NOT take glipiZIDE (GLUCOTROL) or metFORMIN (GLUCOPHAGE)   HOW TO MANAGE YOUR DIABETES BEFORE AND AFTER SURGERY  Why is it important to control my blood sugar before and after surgery? . Improving blood sugar levels before and after surgery helps healing and can limit problems. . A way of improving blood sugar control is eating a healthy diet by: o  Eating less sugar and carbohydrates o  Increasing activity/exercise o  Talking with your doctor about reaching your blood sugar goals . High blood sugars (greater than 180 mg/dL) can raise your risk of infections and slow your recovery, so you will need to focus on controlling your diabetes during the weeks before surgery. . Make sure that the doctor who takes care of your diabetes knows about your planned surgery including the date and  location.  How do I manage my blood sugar before surgery? . Check your blood sugar at least 4 times a day, starting 2 days before surgery, to make sure that the level is not too high or low. . Check your blood sugar the morning of your surgery when you wake up and every 2 hours until you get to the Short Stay unit. o If your blood sugar is less than 70 mg/dL, you will need to treat for low blood sugar: - Do not take insulin. - Treat a low blood sugar (less than 70 mg/dL) with  cup of clear juice (cranberry or apple), 4 glucose tablets, OR glucose gel. - Recheck blood sugar in 15 minutes after treatment (to make sure it is greater than 70 mg/dL). If your blood sugar is not greater than 70 mg/dL on recheck, call [phone] for further instructions. . Report your blood sugar to the short stay nurse when you get to Short Stay.  . If you are admitted to the hospital after surgery: o Your blood sugar will be checked by the staff and you will probably be given insulin after surgery (instead of oral diabetes medicines) to make sure you have good blood sugar levels. o The goal for blood sugar control after surgery is 80-180 mg/dL.             Do not wear jewelry, make up, or nail polish            Do not wear lotions, powders, perfumes, or deodorant.  Do not shave 48 hours prior to surgery.              Do not bring valuables to the hospital.            [facility] is not responsible for any belongings or valuables.  Do NOT Smoke (Tobacco/Vaping) or drink Alcohol 24 hours prior to your procedure If you use a CPAP at night, you may bring all equipment for your overnight stay.   Contacts, glasses, dentures or bridgework may not be worn into surgery.      For patients admitted to the hospital, discharge time will be determined by your treatment team.   Patients discharged the day of surgery will not be allowed to drive home, and someone needs to stay with them for 24 hours.  Special  instructions:   [place]- Preparing For Surgery  Before surgery, you can play an important role. Because skin is not sterile, your skin needs to be as free of germs as possible. You can reduce the number of germs on your skin by washing with CHG (chlorahexidine gluconate) Soap before surgery.  CHG is an antiseptic cleaner which kills germs and bonds with the skin to continue killing germs even after washing.    Oral Hygiene is also important to reduce your risk of infection.  Remember - BRUSH YOUR TEETH THE MORNING OF SURGERY WITH YOUR REGULAR TOOTHPASTE  Please do not use if you have an allergy to CHG or antibacterial soaps. If your skin becomes reddened/irritated stop using the CHG.  Do not shave (including legs and underarms) for at least 48 hours prior to first CHG shower. It is OK to shave your face.  Please follow these instructions carefully.   1. Shower the NIGHT BEFORE SURGERY and the MORNING OF SURGERY with CHG Soap.   2. If you chose to wash your hair, wash your hair first as usual with your normal shampoo.  3. After you shampoo, rinse your hair and body thoroughly to remove the shampoo.  4. Use CHG as you would any other liquid soap. You can apply CHG directly to the skin and wash gently with a scrungie or a clean washcloth.   5. Apply the CHG Soap to your body ONLY FROM THE NECK DOWN.  Do not use on open wounds or open sores. Avoid contact with your eyes, ears, mouth and genitals (private parts). Wash Face and genitals (private parts)  with your normal soap.   6. Wash thoroughly, paying special attention to the area where your surgery will be performed.  7. Thoroughly rinse your body with warm water from the neck down.  8. DO NOT shower/wash with your normal soap after using and rinsing off the CHG Soap.  9. Pat yourself dry with a CLEAN TOWEL.  10. Wear CLEAN PAJAMAS to bed the night before surgery  11. Place CLEAN SHEETS on your bed the night of your first shower and  DO NOT SLEEP WITH PETS.  Day of Surgery: Wear Clean/Comfortable clothing the morning of surgery Do not apply any deodorants/lotions.   Remember to brush your teeth WITH YOUR REGULAR TOOTHPASTE.   Please read over the following fact sheets that you were given.

## 2020-04-12 ENCOUNTER — Ambulatory Visit (HOSPITAL_COMMUNITY)
Admission: RE | Admit: 2020-04-12 | Discharge: 2020-04-12 | Disposition: A | Discharge status: Home / Self Care | Payer: Medicare Other | Source: Ambulatory Visit | Attending: Neurological Surgery | Admitting: Neurological Surgery

## 2020-04-12 ENCOUNTER — Other Ambulatory Visit (HOSPITAL_COMMUNITY): Payer: Medicare Other

## 2020-04-12 ENCOUNTER — Encounter (HOSPITAL_COMMUNITY)
Admission: RE | Admit: 2020-04-12 | Discharge: 2020-04-12 | Disposition: A | Discharge status: Home / Self Care | Payer: Medicare Other | Source: Ambulatory Visit | Attending: Neurological Surgery | Admitting: Neurological Surgery

## 2020-04-12 ENCOUNTER — Other Ambulatory Visit: Payer: Self-pay

## 2020-04-12 ENCOUNTER — Encounter (HOSPITAL_COMMUNITY): Payer: Self-pay

## 2020-04-12 DIAGNOSIS — E785 Hyperlipidemia, unspecified: Secondary | ICD-10-CM | POA: Insufficient documentation

## 2020-04-12 DIAGNOSIS — I2510 Atherosclerotic heart disease of native coronary artery without angina pectoris: Secondary | ICD-10-CM | POA: Insufficient documentation

## 2020-04-12 DIAGNOSIS — M48061 Spinal stenosis, lumbar region without neurogenic claudication: Secondary | ICD-10-CM

## 2020-04-12 DIAGNOSIS — I10 Essential (primary) hypertension: Secondary | ICD-10-CM | POA: Insufficient documentation

## 2020-04-12 DIAGNOSIS — E119 Type 2 diabetes mellitus without complications: Secondary | ICD-10-CM | POA: Insufficient documentation

## 2020-04-12 DIAGNOSIS — Z8673 Personal history of transient ischemic attack (TIA), and cerebral infarction without residual deficits: Secondary | ICD-10-CM | POA: Diagnosis not present

## 2020-04-12 DIAGNOSIS — I252 Old myocardial infarction: Secondary | ICD-10-CM | POA: Insufficient documentation

## 2020-04-12 DIAGNOSIS — Z01812 Encounter for preprocedural laboratory examination: Secondary | ICD-10-CM | POA: Insufficient documentation

## 2020-04-12 HISTORY — DX: Depression, unspecified: F32.A

## 2020-04-12 HISTORY — DX: Shortness of breath: R06.02

## 2020-04-12 HISTORY — DX: Gastro-esophageal reflux disease without esophagitis: K21.9

## 2020-04-12 LAB — PROTIME-INR
INR: 1 (ref 0.8–1.2)
Prothrombin Time: 12.8 seconds (ref 11.4–15.2)

## 2020-04-12 LAB — CBC WITH DIFFERENTIAL/PLATELET
Abs Immature Granulocytes: 0.03 10*3/uL (ref 0.00–0.07)
Basophils Absolute: 0.1 10*3/uL (ref 0.0–0.1)
Basophils Relative: 1 %
Eosinophils Absolute: 0.1 10*3/uL (ref 0.0–0.5)
Eosinophils Relative: 1 %
HCT: 44.6 % (ref 36.0–46.0)
Hemoglobin: 14.5 g/dL (ref 12.0–15.0)
Immature Granulocytes: 0 %
Lymphocytes Relative: 31 %
Lymphs Abs: 3.1 10*3/uL (ref 0.7–4.0)
MCH: 30.7 pg (ref 26.0–34.0)
MCHC: 32.5 g/dL (ref 30.0–36.0)
MCV: 94.3 fL (ref 80.0–100.0)
Monocytes Absolute: 0.8 10*3/uL (ref 0.1–1.0)
Monocytes Relative: 8 %
Neutro Abs: 5.9 10*3/uL (ref 1.7–7.7)
Neutrophils Relative %: 59 %
Platelets: 284 10*3/uL (ref 150–400)
RBC: 4.73 MIL/uL (ref 3.87–5.11)
RDW: 13.2 % (ref 11.5–15.5)
WBC: 10 10*3/uL (ref 4.0–10.5)
nRBC: 0 % (ref 0.0–0.2)

## 2020-04-12 LAB — BASIC METABOLIC PANEL
Anion gap: 10 (ref 5–15)
BUN: 7 mg/dL (ref 6–20)
CO2: 23 mmol/L (ref 22–32)
Calcium: 9.7 mg/dL (ref 8.9–10.3)
Chloride: 105 mmol/L (ref 98–111)
Creatinine, Ser: 0.67 mg/dL (ref 0.44–1.00)
GFR calc Af Amer: >60 mL/min (ref >60)
GFR calc non Af Amer: >60 mL/min (ref >60)
Glucose, Bld: 196 mg/dL — ABNORMAL HIGH (ref 70–99)
Potassium: 3.4 mmol/L — ABNORMAL LOW (ref 3.5–5.1)
Sodium: 138 mmol/L (ref 135–145)

## 2020-04-12 LAB — SURGICAL PCR SCREEN
MRSA, PCR: NEGATIVE
Staphylococcus aureus: NEGATIVE

## 2020-04-12 LAB — HEMOGLOBIN A1C
Hgb A1c MFr Bld: 8.3 % — ABNORMAL HIGH (ref 4.8–5.6)
Mean Plasma Glucose: 191.51 mg/dL

## 2020-04-12 LAB — GLUCOSE, CAPILLARY: Glucose-Capillary: 226 mg/dL — ABNORMAL HIGH (ref 70–99)

## 2020-04-12 NOTE — Progress Notes (Signed)
PCP - Dr. Leo Grosser Cardiologist - denies Neurologist: Dr. Blondell Reveal  PPM/ICD - denies  Chest x-ray - 04/12/2020 EKG - 04/12/2020 Stress Test - 03/20/16 (C.E) ECHO - 2015 (C.E)  Cardiac Cath - 2015 (C.E.)  Sleep Study - denies CPAP - N/A  DM: Type II, per patient does not check blood sugar  Blood Thinner Instructions: N/A Aspirin Instructions: per patient, last dose was on 04/07/2020  ERAS Protcol - No  COVID TEST- Due to transportation issues patient will be tested on the day of surgery. Patient instructed to arrive at the hospital at 11:25 A.M. Patient verbalized understanding.  Anesthesia review: YES, cardiac hx, review cardiac studies, hx of stroke  Patient denies shortness of breath, fever, cough and chest pain at PAT appointment  All instructions explained to the patient, with a verbal understanding of the material. Patient agrees to go over the instructions while at home for a better understanding. Patient also instructed to self quarantine after being tested for COVID-19. The opportunity to ask questions was provided.

## 2020-04-12 NOTE — Progress Notes (Addendum)
Anesthesia PAT Evaluation:  Case: [id number] Date/Time: 04/16/20 1412   Procedure: Laminectomy and Foraminotomy L3-L4 - L5-S1 - left,  L4-L5 - bilateral (Left Back) - 3C   Anesthesia type: General   Pre-op diagnosis: Stenosis   Location: [facility] OR ROOM 20 / [facility] OR   Surgeons: Tia Alert, MD      DISCUSSION: Patient is a 49 year old female scheduled for the above procedure.  History includes smoking, HTN, NSTEMI 05/2014 (troponin 0.02-0.06-0.16-0.15) with minimal CAD by 06/16/14 [facility]), CVA (11/2015), HLD, exertional dyspnea, reflux, mild cognitive impairment (per 05/13/19 [facility] neurology evaluation, possibly due to CVA and possible undiagnosed OSA). She denied cocaine use but does have a history of + UDS on 02/06/16. Denied current alcohol use. BMI is consistent with morbid obesity.  I evaluated patient at her PAT visit. Her activity is limited by her back issues and left sided weakness from prior stroke, but she is able to walk shorter distances independently and has a cane if needed.  She has had back pain for 6-7 years, but "terrible" for the past year. She can barely walk to her bathroom due to pain in her back, buttocks, and legs. She has associated LE weakness which is in addition to mild left sided weakness from her CVA. She denied chest pain. She says she gets intermittent mild pedal edema which has been stable. Ever since her CVA, she reports mild memory issues and both good and bad days. On her bad days she struggles with fatigue and feeling like she can't catch her breath. She feels like she hs to concentrate on taking deep breaths. She does not have associated sick symptoms. Bad days can be followed by "good" days of no fatigue or breathing issues. This has been chronic for years. She does report stable DOE, sometimes even with short distances. She denied dysphagia. She reports that a sleep study was recommended, but not done yet. She smokes ~ 1PPD cigarettes. Last cardiology visit 01/2019 with Dr.  Laurice Record, primarily for HTN and CVA follow-up. She BP and HR were both up on arrival to PAT (see VS), but had improved by the end of her RN interview.  [facility] cannot locate the 06/16/14 cath report (done by Arnoldo Hooker, MD), only the cath lab Procedural Report. According to the Discharge Summary by Adrian Saran, MD, it showed only "minimal CAD".   CXR is still in process. She relies on transportation services, so she is scheduled to get her COVID-19 test on the day of surgery. ASA and ibuprofen are on hold for surgery.   Discussed available information with anesthesiologist Lowella Petties, DO. Patient is a smoker with diabetes and limited activity tolerance. She had an NSTEMI in 2015, although no significant CAD (by notes) at that time. Last stress test 2017. She is followed by cardiology, so would recommend preoperative cardiology input. I have notified Erie Noe at Dr. Yetta Barre' office.   UPDATE 04/13/20 5:12 PM: CXR report reviewed. Surgery has been rescheduled for 04/30/20 to allow time for preoperative cardiology evaluation.     VS: BP 136/79   Pulse (!) 112   Temp 37.2 C (Oral)   Resp 18   Ht 5\' 2"  (1.575 m)   Wt 114.4 kg   SpO2 98%   BMI 46.11 kg/m  BP 143/101 --> 136/79. HR 112-->mid 90's. Heart RRR, mildly tachycardic at times. No murmur noted. No carotid bruits noted. No pedal edema. Lungs clear. No real conversational dyspnea, but did periodically take a deep breath with her  mask on.    PROVIDERS: Francesca Jewett, MD is PCP [facility] Everywhere) - Kennis Carina, MD is cardiologist. Last tele visit 02/17/19 for CVA and HTN follow-up. History of left arm pain felt to be neuropathic with previous work-up in 2017. No additional testing ordered. Return in about 6 months recommended. [facility] Everywhere) - Berniece Salines, MD is neurologist [facility] Everywhere)   LABS: Preoperative labs noted.  A1c 7.7% 02/09/20 [facility]), but now up to 8.3%--she says she has not checked her home CBGs  in a while. I notified Erie Noe at Dr. Yetta Barre' office of current A1c result. (all labs ordered are listed, but only abnormal results are displayed)  Labs Reviewed  GLUCOSE, CAPILLARY - Abnormal; Notable for the following components:      Result Value   Glucose-Capillary 226 (*)    All other components within normal limits  BASIC METABOLIC PANEL - Abnormal; Notable for the following components:   Potassium 3.4 (*)    Glucose, Bld 196 (*)    All other components within normal limits  HEMOGLOBIN A1C - Abnormal; Notable for the following components:   Hgb A1c MFr Bld 8.3 (*)    All other components within normal limits  SURGICAL PCR SCREEN  CBC WITH DIFFERENTIAL/PLATELET  PROTIME-INR    IMAGES: CXR 04/12/20:  FINDINGS: Lungs are adequately inflated and otherwise clear. Cardiomediastinal silhouette and remainder of the exam is unchanged. IMPRESSION: No active cardiopulmonary disease.  MRI L-spine 07/12/19: IMPRESSION: 1. Broad-based central/left subarticular disc protrusion at L5-S1, closely approximating and potentially irritating either of the descending S1 nerve roots, particularly on the left. 2. Multifactorial degenerative changes at L4-5 with resultant moderate to severe canal with right worse than left lateral recess stenosis, with moderate right worse than left L4 foraminal narrowing. 3. Disc bulging and facet arthrosis at L3-4 with resultant moderate spinal stenosis, with moderate right L3 foraminal narrowing.   EKG: 04/12/20 09:27:07: Normal sinus rhythm Possible Left atrial enlargement Borderline ECG   CV: 48 Hour Holter monitor 04/25/16-04/27/16 [facility] CE) Conclusions:  Analysis time: 48 hours  The predominant rhythm was sinus, from 55 BPM to 145 BPM, average 90 BPM.  Episodes of second degree AV block type 1 were noted.  IV conduction was within normal limits.  The longest R-R was 2.4 seconds.  No symptoms were reported.  Overall recording quality was good.   There were no symptoms recorded.    Limited Echo 04/26/16 [facility] CE):  Limited study to evaluate for intracardiac shunt   Normal left ventricular systolic function, ejection fraction 60 to 65%   No evidence of an interatrial communication or intrapulmonary shunt   Nuclear stress test 03/20/16 [facility] CE): Impressions:  - No evidence for significant ischemia or scar is noted.  - Post stress: Global systolic function is normal. The ejection fraction  was greater than 65%.  - Mild coronary calcifications are noted    Echo 11/28/15 [facility] CE): Impressions:  - No evidence for significant ischemia or scar is noted.  - Post stress: Global systolic function is normal. The ejection fraction  was greater than 65%.  - Mild coronary calcifications are noted   Cardiac cath 06/16/14 [facility]):   MRA neck 10/22/11 [facility] CE): IMPRESSION: Unremarkable noncontrast MRA of the neck.     Past Medical History:  Diagnosis Date  . Abscess of breast, right 02/04/2015  . Acid reflux   . DDD (degenerative disc disease), lumbar   . Depression   . Diabetes mellitus without complication (  HCC)   . Hyperlipidemia   . Hypertension   . MI (myocardial infarction) (HCC) 2015  . Short of breath on exertion    per patient get short of breath with activity  . Stroke [facility]) 2017   patient can't remember if it was 2016 or 2017    History reviewed. No pertinent surgical history.  MEDICATIONS: . aspirin 81 MG chewable tablet  . Aspirin-Caffeine (BAYER BACK & BODY PO)  . atorvastatin (LIPITOR) 80 MG tablet  . Biotin 10 MG CAPS  . cyclobenzaprine (FLEXERIL) 10 MG tablet  . DULoxetine (CYMBALTA) 60 MG capsule  . fexofenadine (ALLEGRA) 180 MG tablet  . fluticasone (FLONASE) 50 MCG/ACT nasal spray  . glipiZIDE (GLUCOTROL) 10 MG tablet  . glucose blood (FREESTYLE TEST STRIPS) test strip  . hydrALAZINE (APRESOLINE) 100 MG tablet  . ibuprofen (ADVIL) 800 MG tablet  . loratadine (CLARITIN) 10 MG tablet  . metFORMIN  (GLUCOPHAGE) 500 MG tablet  . nitroGLYCERIN (NITROSTAT) 0.4 MG SL tablet  . Omega-3 Fatty Acids (OMEGA 3 500) 500 MG CAPS  . pregabalin (LYRICA) 150 MG capsule  . verapamil (CALAN-SR) 240 MG CR tablet  . vitamin B-12 (CYANOCOBALAMIN) 500 MCG tablet   No current facility-administered medications for this encounter.    Shonna Chock, PA-C Surgical Short Stay/Anesthesiology [facility] Phone [phone] [facility] Phone [phone] 04/12/2020 3:23 PM

## 2020-04-12 NOTE — Progress Notes (Signed)
Abnormal labs have resulted, A1c 8.1. Dr. Yetta Barre notified via [employer].

## 2020-04-13 MED ORDER — VANCOMYCIN HCL 1500 MG/300ML IV SOLN
1500.0000 mg | INTRAVENOUS | Status: AC
  Filled 2020-04-13: qty 300

## 2020-04-25 ENCOUNTER — Encounter (HOSPITAL_COMMUNITY): Payer: Self-pay

## 2020-04-25 ENCOUNTER — Encounter (HOSPITAL_COMMUNITY)
Admission: RE | Admit: 2020-04-25 | Discharge: 2020-04-25 | Disposition: A | Discharge status: Home / Self Care | Payer: Medicare Other | Source: Ambulatory Visit | Attending: Neurological Surgery | Admitting: Neurological Surgery

## 2020-04-25 ENCOUNTER — Other Ambulatory Visit: Payer: Self-pay

## 2020-04-25 DIAGNOSIS — M5126 Other intervertebral disc displacement, lumbar region: Secondary | ICD-10-CM | POA: Insufficient documentation

## 2020-04-25 DIAGNOSIS — Z79899 Other long term (current) drug therapy: Secondary | ICD-10-CM | POA: Insufficient documentation

## 2020-04-25 DIAGNOSIS — I10 Essential (primary) hypertension: Secondary | ICD-10-CM | POA: Diagnosis not present

## 2020-04-25 DIAGNOSIS — Z7982 Long term (current) use of aspirin: Secondary | ICD-10-CM | POA: Insufficient documentation

## 2020-04-25 DIAGNOSIS — F419 Anxiety disorder, unspecified: Secondary | ICD-10-CM | POA: Diagnosis not present

## 2020-04-25 DIAGNOSIS — Z01812 Encounter for preprocedural laboratory examination: Secondary | ICD-10-CM | POA: Diagnosis not present

## 2020-04-25 DIAGNOSIS — M5127 Other intervertebral disc displacement, lumbosacral region: Secondary | ICD-10-CM | POA: Insufficient documentation

## 2020-04-25 DIAGNOSIS — I252 Old myocardial infarction: Secondary | ICD-10-CM | POA: Diagnosis not present

## 2020-04-25 DIAGNOSIS — M47816 Spondylosis without myelopathy or radiculopathy, lumbar region: Secondary | ICD-10-CM | POA: Insufficient documentation

## 2020-04-25 DIAGNOSIS — Z7984 Long term (current) use of oral hypoglycemic drugs: Secondary | ICD-10-CM | POA: Insufficient documentation

## 2020-04-25 DIAGNOSIS — E119 Type 2 diabetes mellitus without complications: Secondary | ICD-10-CM | POA: Insufficient documentation

## 2020-04-25 DIAGNOSIS — E785 Hyperlipidemia, unspecified: Secondary | ICD-10-CM | POA: Insufficient documentation

## 2020-04-25 DIAGNOSIS — Z8673 Personal history of transient ischemic attack (TIA), and cerebral infarction without residual deficits: Secondary | ICD-10-CM | POA: Diagnosis not present

## 2020-04-25 DIAGNOSIS — M48061 Spinal stenosis, lumbar region without neurogenic claudication: Secondary | ICD-10-CM | POA: Insufficient documentation

## 2020-04-25 DIAGNOSIS — I2510 Atherosclerotic heart disease of native coronary artery without angina pectoris: Secondary | ICD-10-CM | POA: Diagnosis not present

## 2020-04-25 HISTORY — DX: Anxiety disorder, unspecified: F41.9

## 2020-04-25 LAB — BASIC METABOLIC PANEL
Anion gap: 8 (ref 5–15)
BUN: 12 mg/dL (ref 6–20)
CO2: 26 mmol/L (ref 22–32)
Calcium: 9.1 mg/dL (ref 8.9–10.3)
Chloride: 105 mmol/L (ref 98–111)
Creatinine, Ser: 0.73 mg/dL (ref 0.44–1.00)
GFR calc Af Amer: >60 mL/min (ref >60)
GFR calc non Af Amer: >60 mL/min (ref >60)
Glucose, Bld: 245 mg/dL — ABNORMAL HIGH (ref 70–99)
Potassium: 3.5 mmol/L (ref 3.5–5.1)
Sodium: 139 mmol/L (ref 135–145)

## 2020-04-25 LAB — CBC
HCT: 41 % (ref 36.0–46.0)
Hemoglobin: 13.3 g/dL (ref 12.0–15.0)
MCH: 31 pg (ref 26.0–34.0)
MCHC: 32.4 g/dL (ref 30.0–36.0)
MCV: 95.6 fL (ref 80.0–100.0)
Platelets: 297 10*3/uL (ref 150–400)
RBC: 4.29 MIL/uL (ref 3.87–5.11)
RDW: 13.6 % (ref 11.5–15.5)
WBC: 11.2 10*3/uL — ABNORMAL HIGH (ref 4.0–10.5)
nRBC: 0 % (ref 0.0–0.2)

## 2020-04-25 LAB — GLUCOSE, CAPILLARY: Glucose-Capillary: 206 mg/dL — ABNORMAL HIGH (ref 70–99)

## 2020-04-25 NOTE — Progress Notes (Signed)
Your procedure is scheduled on Mon., Aug. 9, 2021 from 2:44PM-4:51PM             Report to [facility] Main Entrance "A" at 11:40AM             Call this number if you have problems the morning of surgery:             [phone]  Call [phone] if you have any questions prior to your surgery date Monday-Friday 8am-4pm              Remember:             Do not eat or drink after midnight the night before your surgery                          Take these medicines the morning of surgery with A SIP OF WATER  atorvastatin (LIPITOR) DULoxetine (CYMBALTA)  fexofenadine (ALLEGRA)  hydrALAZINE (APRESOLINE)  pregabalin (LYRICA)   If needed - cyclobenzaprine (FLEXERIL), fluticasone (FLONASE)/nasal spray, nitroGLYCERIN (NITROSTAT)  Follow your surgeon's instructions on when to stop Aspirin.  If no instructions were given by your surgeon then you will need to call the office to get those instructions.    As of today, STOP taking Aspirin-Caffeine (BAYER BACK & BODY PO) Aleve, Naproxen, Ibuprofen, Motrin, Advil, Goody's, BC's, all herbal medications, fish oil, and all vitamins.  WHAT DO I DO ABOUT MY DIABETES MEDICATION?  ---The morning of surgry--- Do NOT take glipiZIDE (GLUCOTROL) or metFORMIN (GLUCOPHAGE)   HOW TO MANAGE YOUR DIABETES BEFORE AND AFTER SURGERY  Why is it important to control my blood sugar before and after surgery?  Improving blood sugar levels before and after surgery helps healing and can limit problems.  A way of improving blood sugar control is eating a healthy diet by: ?  Eating less sugar and carbohydrates ?  Increasing activity/exercise ?  Talking with your doctor about reaching your blood sugar goals  High blood sugars (greater than 180 mg/dL) can raise your risk of infections and slow your recovery, so you will need to focus on controlling your diabetes during the weeks before surgery.  Make sure that the doctor who takes care of your  diabetes knows about your planned surgery including the date and location.  How do I manage my blood sugar before surgery?  Check your blood sugar at least 4 times a day, starting 2 days before surgery, to make sure that the level is not too high or low.  Check your blood sugar the morning of your surgery when you wake up and every 2 hours until you get to the Short Stay unit. ? If your blood sugar is less than 70 mg/dL, you will need to treat for low blood sugar:  Do not take insulin.  Treat a low blood sugar (less than 70 mg/dL) with  cup of clear juice (cranberry or apple), 4glucose tablets, OR glucose gel.  Recheck blood sugar in 15 minutes after treatment (to make sure it is greater than 70 mg/dL). If your blood sugar is not greater than 70 mg/dL on recheck, call [phone] for further instructions.  Report your blood sugar to the short stay nurse when you get to Short Stay.   If you are admitted to the hospital after surgery: ? Your blood sugar will be checked by the staff and you will probably be given insulin after surgery (instead of oral diabetes medicines) to make sure  you have good blood sugar levels. ? The goal for blood sugar control after surgery is 80-180 mg/dL.  Do not wear jewelry, make up, or nail polish Do notwear lotions, powders, perfumes, or deodorant. Do notshave 48 hours prior to surgery.  Do notbring valuables to the hospital. [facility] is not responsible for any belongings or valuables.  Do NOT Smoke (Tobacco/Vaping) or drink Alcohol 24 hours prior to your procedure If you use a CPAP at night, you may bring all equipment for your overnight stay.  Contacts, glasses, dentures or bridgework may not be worn into surgery.    For patients admitted to the hospital, discharge time will be determined by your treatment team.  Patients discharged the day of surgery will not be allowed to drive  home, and someone needs to stay with them for 24 hours.  Special instructions:   [place]- Preparing For Surgery  Before surgery, you can play an important role. Because skin is not sterile, your skin needs to be as free of germs as possible. You can reduce the number of germs on your skin by washing with CHG (chlorahexidine gluconate) Soap before surgery.  CHG is an antiseptic cleaner which kills germs and bonds with the skin to continue killing germs even after washing.    Oral Hygiene is also important to reduce your risk of infection.  Remember - BRUSH YOUR TEETH THE MORNING OF SURGERY WITH YOUR REGULAR TOOTHPASTE  Please do not use if you have an allergy to CHG or antibacterial soaps. If your skin becomes reddened/irritated stop using the CHG.  Do not shave (including legs and underarms) for at least 48 hours prior to first CHG shower. It is OK to shave your face.  Please follow these instructions carefully.                                                                                                                               1. Shower the NIGHT BEFORE SURGERY and the MORNING OF SURGERY with CHG Soap.   2. If you chose to wash your hair, wash your hair first as usual with your normal shampoo.  3. After you shampoo, rinse your hair and body thoroughly to remove the shampoo.  4. Use CHG as you would any other liquid soap. You can apply CHG directly to the skin and wash gently with a scrungie or a clean washcloth.   5. Apply the CHG Soap to your body ONLY FROM THE NECK DOWN.  Do not use on open wounds or open sores. Avoid contact with your eyes, ears, mouth and genitals (private parts). Wash Face and genitals (private parts)  with your normal soap.   6. Wash thoroughly, paying special attention to the area where your surgery will be performed.  7. Thoroughly rinse your body with warm water from the neck down.  8. DO NOT shower/wash with your normal soap after using  and rinsing off the CHG  Soap.  9. Pat yourself dry with a CLEAN TOWEL.  10. Wear CLEAN PAJAMAS to bed the night before surgery  11. Place CLEAN SHEETS on your bed the night of your first shower and DO NOT SLEEP WITH PETS.  Day of Surgery: Wear Clean/Comfortable clothing the morning of surgery Do notapply any deodorants/lotions.  Remember to brush your teeth WITH YOUR REGULAR TOOTHPASTE.  Please read over the following fact sheets that you were given.

## 2020-04-25 NOTE — Progress Notes (Signed)
PCP - Dr. Jason Coop  Cardiologist - Dr. Laurice Record (CE)  Chest x-ray - 04/12/20 (E)  EKG - 04/12/20 (E)  Stress Test - 03/20/16 (CE)  ECHO - 06/23/14 (CE)  Cardiac Cath - 06/16/14 (CE)  AICD-na PM-na LOOP-na  Sleep Study - Denies CPAP - None  LABS- 04/25/20: CBC, BMP 04/30/20: COVID- Pt will need day of surgery due to transportation issues.  ASA-LD- 04/07/20 Plavix- Pt sts she is not taking this medicine, and has not been on it for many many years.  ERAS- No  HA1C- 04/12/20 8.3 (E) Fasting Blood Sugar - 160-200, today 206 Checks Blood Sugar ___2__ times a day when she remembers  Anesthesia- Yes- previous consult 04/12/20  Pt denies having chest pain, sob, or fever at this time. All instructions explained to the pt, with a verbal understanding of the material. Pt agrees to go over the instructions while at home for a better understanding. Pt also instructed to self quarantine after being tested for COVID-19. The opportunity to ask questions was provided.   Coronavirus Screening  Have you experienced the following symptoms:  Cough yes/no: No Fever (>100.42F)  yes/no: No Runny nose yes/no: No Sore throat yes/no: No Difficulty breathing/shortness of breath  yes/no: No  Have you or a family member traveled in the last 14 days and where? yes/no: No   If the patient indicates "YES" to the above questions, their PAT will be rescheduled to limit the exposure to others and, the surgeon will be notified. THE PATIENT WILL NEED TO BE ASYMPTOMATIC FOR 14 DAYS.   If the patient is not experiencing any of these symptoms, the PAT nurse will instruct them to NOT bring anyone with them to their appointment since they may have these symptoms or traveled as well.   Please remind your patients and families that hospital visitation restrictions are in effect and the importance of the restrictions.

## 2020-04-26 NOTE — Progress Notes (Addendum)
Anesthesia Chart Review:  Case: [id number] Date/Time: 04/30/20 1429   Procedure: Laminectomy and Foraminotomy L3-L4 - L5-S1 - left,  L4-L5 - bilateral (Left Back) - 3C   Anesthesia type: General   Pre-op diagnosis: Stenosis   Location: [facility] ROOM 21 / [facility] OR   Surgeons: Tia Alert, MD      DISCUSSION: Patient is a 49 year old female scheduled for the above procedure.  Surgery was initially scheduled for 04/16/2020 but postponed to allow time for preoperative cardiology evaluation. Clearance note received from cardiologist Dr. Laurice Record, signed on 04/16/20 indicating she was felt to be "Low Risk" for upcoming surgery; however, she saw patient on 04/23/20 in which an echocardiogram and stress test were recommended and are being done on 04/26/20.  History includes smoking, HTN, NSTEMI 05/2014 (troponin 0.02-0.06-0.16-0.15) with minimal CAD by 06/16/14 [facility]), CVA (11/2015, left sided weakness; has been off Plavix for many years), HLD, exertional dyspnea, reflux, mild cognitive impairment (per 05/13/19 [facility] neurology evaluation, possibly due to CVA and possible undiagnosed OSA). She denied cocaine use but does have a history of + UDS on 02/06/16. Denied current alcohol use. BMI is consistent with morbid obesity.  [facility] cannot locate the 06/16/14 cath report (done by Arnoldo Hooker, MD), only the cath lab Procedural Report. According to the Discharge Summary by Adrian Saran, MD, it showed only "minimal CAD".   Last ASA 04/08/11. She is getting a COVID-19 test on the day of surgery due to transportation issues.   Chart will be left for follow-up regarding results of 04/26/20 testing. (UPDATE 04/27/20 9:20 AM: 04/26/20 cardiac testing through [facility] showed normal LVEF without significant valvular disease and normal perfusion on myocardial perfusion study--studies outlined below.)   VS: BP (!) 143/88   Pulse 100   Temp 36.8 C (Oral)   Resp 18   Ht 5\' 2"  (1.575 m)   Wt 115.1 kg   SpO2 97%   BMI 46.42 kg/m    I evaluated her at her initial PAT visit on 04/12/20.   PROVIDERS: Francesca Jewett, MD is PCP - Kennis Carina, MD is cardiologist [facility] Care Everywhere) - Berniece Salines, MD is neurologist [facility] Everywhere)   LABS: Preoperative labs noted.  A1c 7.7% 02/09/20 [facility]), but up to 8.3% on 04/12/20--she says she has not checked her home CBGs in a while. I notified Erie Noe at Dr. Yetta Barre' office of A1c result back in July. (all labs ordered are listed, but only abnormal results are displayed)  Labs Reviewed  GLUCOSE, CAPILLARY - Abnormal; Notable for the following components:      Result Value   Glucose-Capillary 206 (*)    All other components within normal limits  BASIC METABOLIC PANEL - Abnormal; Notable for the following components:   Glucose, Bld 245 (*)    All other components within normal limits  CBC - Abnormal; Notable for the following components:   WBC 11.2 (*)    All other components within normal limits     IMAGES: CXR 04/12/20:  FINDINGS: Lungs are adequately inflated and otherwise clear. Cardiomediastinal silhouette and remainder of the exam is unchanged. IMPRESSION: No active cardiopulmonary disease.  MRI L-spine 07/12/19: IMPRESSION: 1. Broad-based central/left subarticular disc protrusion at L5-S1, closely approximating and potentially irritating either of the descending S1 nerve roots, particularly on the left. 2. Multifactorial degenerative changes at L4-5 with resultant moderate to severe canal with right worse than left lateral recess stenosis, with moderate right worse than left L4 foraminal narrowing. 3. Disc  bulging and facet arthrosis at L3-4 with resultant moderate spinal stenosis, with moderate right L3 foraminal narrowing.   EKG: 04/12/20 09:27:07: Normal sinus rhythm Possible Left atrial enlargement Borderline ECG   CV: PET CT Myocardial Perfusion Study 04/26/20 [facility]):  Normal myocardial perfusion study   No evidence of any  significant ischemia or scar   Left ventricular systolic function is normal. Post stress the ejection  fraction is calculated at 58%. The left ventricle is normal in size.   Minimal coronary calcifications are noted    Echo 04/26/20 [facility]): Summary  1. The left ventricle is normal in size with moderately increased wall  thickness.  2. The left ventricular systolic function is normal, LVEF is visually  estimated at 65-70%.  3. There is grade I diastolic dysfunction (impaired relaxation).  4. The aortic valve is trileaflet with mildly thickened leaflets with normal  excursion.  5. The right ventricle is normal in size, with normal systolic function.   (No evidence of an interatrial communication or intrapulmonary shunt on 04/26/16 limited echo)   48 Hour Holter monitor 04/25/16-04/27/16 [facility]) Conclusions:  Analysis time: 48 hours  The predominant rhythm was sinus, from 55 BPM to 145 BPM, average 90 BPM.  Episodes of second degree AV block type 1 were noted.  IV conduction was within normal limits.  The longest R-R was 2.4 seconds.  No symptoms were reported.  Overall recording quality was good.  There were no symptoms recorded.    Nuclear stress test 03/20/16 [facility]): Impressions:  - No evidence for significant ischemia or scar is noted.  - Post stress: Global systolic function is normal. The ejection fraction  was greater than 65%.  - Mild coronary calcifications are noted    Cardiac cath 06/16/14 [facility]): [facility] cannot locate the 06/16/14 cath report (done by Arnoldo Hooker, MD), only the cath lab Procedural Report. According to the Discharge Summary by Adrian Saran, MD, it showed only "minimal CAD".    MRA neck 10/22/11 [facility]): IMPRESSION: Unremarkable noncontrast MRA of the neck.    Past Medical History:  Diagnosis Date  . Abscess of breast, right 02/04/2015  . Acid reflux   . Anxiety   . DDD (degenerative disc disease), lumbar   . Depression   .  Diabetes mellitus without complication (HCC)    Type II  . Hyperlipidemia   . Hypertension   . MI (myocardial infarction) (HCC) 2015  . Short of breath on exertion    per patient get short of breath with activity  . Stroke [facility]) 2017   patient can't remember if it was 2016 or 2017    Past Surgical History:  Procedure Laterality Date  . CARDIAC CATHETERIZATION    . TUBAL LIGATION      MEDICATIONS: . aspirin 81 MG chewable tablet  . Aspirin-Caffeine (BAYER BACK & BODY PO)  . atorvastatin (LIPITOR) 80 MG tablet  . Biotin 10 MG CAPS  . cyclobenzaprine (FLEXERIL) 10 MG tablet  . DULoxetine (CYMBALTA) 60 MG capsule  . fexofenadine (ALLEGRA) 180 MG tablet  . fluticasone (FLONASE) 50 MCG/ACT nasal spray  . glipiZIDE (GLUCOTROL) 10 MG tablet  . glucose blood (FREESTYLE TEST STRIPS) test strip  . hydrALAZINE (APRESOLINE) 100 MG tablet  . ibuprofen (ADVIL) 800 MG tablet  . loratadine (CLARITIN) 10 MG tablet  . metFORMIN (GLUCOPHAGE) 500 MG tablet  . nitroGLYCERIN (NITROSTAT) 0.4 MG SL tablet  . Omega-3 Fatty Acids (OMEGA 3 500) 500 MG CAPS  . pregabalin (  LYRICA) 150 MG capsule  . verapamil (CALAN-SR) 240 MG CR tablet  . vitamin B-12 (CYANOCOBALAMIN) 500 MCG tablet   No current facility-administered medications for this encounter.    Shonna Chock, PA-C Surgical Short Stay/Anesthesiology [facility] Phone [phone] [facility] Phone [phone] 04/26/2020 9:40 AM

## 2020-04-27 NOTE — Anesthesia Preprocedure Evaluation (Addendum)
Anesthesia Evaluation  Patient identified by MRN, date of birth, ID band Patient awake    Reviewed: Allergy & Precautions, NPO status , Patient's Chart, lab work & pertinent test results  History of Anesthesia Complications Negative for: history of anesthetic complications  Airway Mallampati: II  TM Distance: >3 FB Neck ROM: Full    Dental  (+) Teeth Intact, Dental Advisory Given   Pulmonary Current SmokerPatient did not abstain from smoking.,    Pulmonary exam normal breath sounds clear to auscultation       Cardiovascular hypertension, Pt. on medications (-) angina+ CAD and + Past MI  Normal cardiovascular exam Rhythm:Regular Rate:Normal  PET CT Myocardial Perfusion Study 04/26/20 [facility]):  Normal myocardial perfusion study   No evidence of any significant ischemia or scar   Left ventricular systolic function is normal. Post stress the ejection  fraction is calculated at 58%. The left ventricle is normal in size.   Minimal coronary calcifications are noted    Echo 04/26/20 [facility]): Summary  1. The left ventricle is normal in size with moderately increased wall  thickness.  2. The left ventricular systolic function is normal, LVEF is visually  estimated at 65-70%.  3. There is grade I diastolic dysfunction (impaired relaxation).  4. The aortic valve is trileaflet with mildly thickened leaflets with normal  excursion.  5. The right ventricle is normal in size, with normal systolic function.   (No evidence of an interatrial communication or intrapulmonary shunt on 04/26/16 limited echo)      Neuro/Psych PSYCHIATRIC DISORDERS Anxiety Depression Lumbar stenosis TIA Neuromuscular disease CVA, Residual Symptoms    GI/Hepatic Neg liver ROS, GERD  ,  Endo/Other  diabetes, Type 2, Oral Hypoglycemic AgentsMorbid obesity  Renal/GU negative Renal ROS     Musculoskeletal  (+) Arthritis ,   Abdominal   Peds   Hematology negative hematology ROS (+)   Anesthesia Other Findings Day of surgery medications reviewed with the patient.  Reproductive/Obstetrics                           Anesthesia Physical Anesthesia Plan  ASA: III  Anesthesia Plan: General   Post-op Pain Management:    Induction: Intravenous  PONV Risk Score and Plan: 2 and Midazolam, Dexamethasone and Ondansetron  Airway Management Planned: Oral ETT  Additional Equipment:   Intra-op Plan:   Post-operative Plan: Extubation in OR  Informed Consent: I have reviewed the patients History and Physical, chart, labs and discussed the procedure including the risks, benefits and alternatives for the proposed anesthesia with the patient or authorized representative who has indicated his/her understanding and acceptance.     Dental advisory given  Plan Discussed with: CRNA  Anesthesia Plan Comments:         Anesthesia Quick Evaluation

## 2020-04-30 ENCOUNTER — Encounter (HOSPITAL_COMMUNITY)
Admission: RE | Disposition: A | Discharge status: Home / Self Care | Payer: Self-pay | Source: Home / Self Care | Attending: Neurological Surgery

## 2020-04-30 ENCOUNTER — Other Ambulatory Visit: Payer: Self-pay

## 2020-04-30 ENCOUNTER — Ambulatory Visit (HOSPITAL_COMMUNITY): Payer: Medicare Other | Admitting: Vascular Surgery

## 2020-04-30 ENCOUNTER — Ambulatory Visit (HOSPITAL_COMMUNITY)
Admission: RE | Admit: 2020-04-30 | Discharge: 2020-05-01 | Disposition: A | Discharge status: Home / Self Care | Payer: Medicare Other | Attending: Neurological Surgery | Admitting: Neurological Surgery

## 2020-04-30 ENCOUNTER — Encounter (HOSPITAL_COMMUNITY): Payer: Self-pay | Admitting: Neurological Surgery

## 2020-04-30 ENCOUNTER — Ambulatory Visit (HOSPITAL_COMMUNITY): Payer: Medicare Other

## 2020-04-30 DIAGNOSIS — E785 Hyperlipidemia, unspecified: Secondary | ICD-10-CM | POA: Diagnosis not present

## 2020-04-30 DIAGNOSIS — M48061 Spinal stenosis, lumbar region without neurogenic claudication: Secondary | ICD-10-CM | POA: Diagnosis not present

## 2020-04-30 DIAGNOSIS — Z8249 Family history of ischemic heart disease and other diseases of the circulatory system: Secondary | ICD-10-CM | POA: Diagnosis not present

## 2020-04-30 DIAGNOSIS — Z7982 Long term (current) use of aspirin: Secondary | ICD-10-CM | POA: Insufficient documentation

## 2020-04-30 DIAGNOSIS — K219 Gastro-esophageal reflux disease without esophagitis: Secondary | ICD-10-CM | POA: Insufficient documentation

## 2020-04-30 DIAGNOSIS — I10 Essential (primary) hypertension: Secondary | ICD-10-CM | POA: Diagnosis not present

## 2020-04-30 DIAGNOSIS — Z833 Family history of diabetes mellitus: Secondary | ICD-10-CM | POA: Diagnosis not present

## 2020-04-30 DIAGNOSIS — Z9889 Other specified postprocedural states: Secondary | ICD-10-CM

## 2020-04-30 DIAGNOSIS — E119 Type 2 diabetes mellitus without complications: Secondary | ICD-10-CM | POA: Insufficient documentation

## 2020-04-30 DIAGNOSIS — F329 Major depressive disorder, single episode, unspecified: Secondary | ICD-10-CM | POA: Diagnosis not present

## 2020-04-30 DIAGNOSIS — Z791 Long term (current) use of non-steroidal anti-inflammatories (NSAID): Secondary | ICD-10-CM | POA: Diagnosis not present

## 2020-04-30 DIAGNOSIS — Z8673 Personal history of transient ischemic attack (TIA), and cerebral infarction without residual deficits: Secondary | ICD-10-CM | POA: Diagnosis not present

## 2020-04-30 DIAGNOSIS — Z7984 Long term (current) use of oral hypoglycemic drugs: Secondary | ICD-10-CM | POA: Diagnosis not present

## 2020-04-30 DIAGNOSIS — F419 Anxiety disorder, unspecified: Secondary | ICD-10-CM | POA: Diagnosis not present

## 2020-04-30 DIAGNOSIS — Z419 Encounter for procedure for purposes other than remedying health state, unspecified: Secondary | ICD-10-CM

## 2020-04-30 DIAGNOSIS — I252 Old myocardial infarction: Secondary | ICD-10-CM | POA: Diagnosis not present

## 2020-04-30 DIAGNOSIS — M5416 Radiculopathy, lumbar region: Secondary | ICD-10-CM | POA: Diagnosis not present

## 2020-04-30 DIAGNOSIS — F17210 Nicotine dependence, cigarettes, uncomplicated: Secondary | ICD-10-CM | POA: Insufficient documentation

## 2020-04-30 DIAGNOSIS — Z888 Allergy status to other drugs, medicaments and biological substances status: Secondary | ICD-10-CM | POA: Insufficient documentation

## 2020-04-30 DIAGNOSIS — Z79899 Other long term (current) drug therapy: Secondary | ICD-10-CM | POA: Insufficient documentation

## 2020-04-30 DIAGNOSIS — Z880 Allergy status to penicillin: Secondary | ICD-10-CM | POA: Diagnosis not present

## 2020-04-30 DIAGNOSIS — Z20822 Contact with and (suspected) exposure to covid-19: Secondary | ICD-10-CM | POA: Insufficient documentation

## 2020-04-30 HISTORY — PX: LUMBAR LAMINECTOMY/DECOMPRESSION MICRODISCECTOMY: SHX5026

## 2020-04-30 LAB — GLUCOSE, CAPILLARY
Glucose-Capillary: 204 mg/dL — ABNORMAL HIGH (ref 70–99)
Glucose-Capillary: 166 mg/dL — ABNORMAL HIGH (ref 70–99)
Glucose-Capillary: 169 mg/dL — ABNORMAL HIGH (ref 70–99)
Glucose-Capillary: 404 mg/dL — ABNORMAL HIGH (ref 70–99)

## 2020-04-30 LAB — SARS CORONAVIRUS 2 BY RT PCR (HOSPITAL ORDER, PERFORMED IN ~~LOC~~ HOSPITAL LAB): SARS Coronavirus 2: NEGATIVE

## 2020-04-30 SURGERY — LUMBAR LAMINECTOMY/DECOMPRESSION MICRODISCECTOMY 3 LEVELS
Anesthesia: General | Site: Back | Laterality: Left

## 2020-04-30 MED ORDER — MIDAZOLAM HCL 5 MG/5ML IJ SOLN
INTRAMUSCULAR | Status: DC | PRN
  Administered 2020-04-30: 2 mg via INTRAVENOUS

## 2020-04-30 MED ORDER — FENTANYL CITRATE (PF) 100 MCG/2ML IJ SOLN: 25.0000 ug | INTRAMUSCULAR | Status: DC | PRN

## 2020-04-30 MED ORDER — SODIUM CHLORIDE 0.9% FLUSH
3.0000 mL | Freq: Two times a day (BID) | INTRAVENOUS | Status: DC
  Administered 2020-04-30: 3 mL via INTRAVENOUS

## 2020-04-30 MED ORDER — DEXAMETHASONE SODIUM PHOSPHATE 10 MG/ML IJ SOLN
INTRAMUSCULAR | Status: AC
  Filled 2020-04-30: qty 1

## 2020-04-30 MED ORDER — 0.9 % SODIUM CHLORIDE (POUR BTL) OPTIME
TOPICAL | Status: DC | PRN
  Administered 2020-04-30: 1000 mL

## 2020-04-30 MED ORDER — CEFAZOLIN SODIUM 1 G IJ SOLR
INTRAMUSCULAR | Status: AC
  Filled 2020-04-30: qty 20

## 2020-04-30 MED ORDER — ACETAMINOPHEN 500 MG PO TABS
1000.0000 mg | ORAL_TABLET | Freq: Once | ORAL | Status: AC
  Administered 2020-04-30: 1000 mg via ORAL

## 2020-04-30 MED ORDER — GLIPIZIDE 5 MG PO TABS
10.0000 mg | ORAL_TABLET | Freq: Two times a day (BID) | ORAL | Status: DC
  Filled 2020-04-30: qty 2

## 2020-04-30 MED ORDER — KETOROLAC TROMETHAMINE 30 MG/ML IJ SOLN
INTRAMUSCULAR | Status: DC | PRN
  Administered 2020-04-30: 30 mg via INTRAVENOUS

## 2020-04-30 MED ORDER — PROMETHAZINE HCL 25 MG/ML IJ SOLN: 6.2500 mg | INTRAMUSCULAR | Status: DC | PRN

## 2020-04-30 MED ORDER — ROCURONIUM BROMIDE 10 MG/ML (PF) SYRINGE
PREFILLED_SYRINGE | INTRAVENOUS | Status: AC
  Filled 2020-04-30: qty 10

## 2020-04-30 MED ORDER — LACTATED RINGERS IV SOLN: INTRAVENOUS | Status: DC

## 2020-04-30 MED ORDER — CEFAZOLIN SODIUM-DEXTROSE 2-3 GM-%(50ML) IV SOLR
INTRAVENOUS | Status: DC | PRN
  Administered 2020-04-30: 2 g via INTRAVENOUS

## 2020-04-30 MED ORDER — LIDOCAINE 2% (20 MG/ML) 5 ML SYRINGE
INTRAMUSCULAR | Status: AC
  Filled 2020-04-30: qty 5

## 2020-04-30 MED ORDER — DULOXETINE HCL 30 MG PO CPEP
60.0000 mg | ORAL_CAPSULE | Freq: Two times a day (BID) | ORAL | Status: DC
  Administered 2020-04-30: 60 mg via ORAL
  Filled 2020-04-30: qty 2

## 2020-04-30 MED ORDER — CHLORHEXIDINE GLUCONATE 0.12 % MT SOLN
OROMUCOSAL | Status: AC
  Administered 2020-04-30: 15 mL via OROMUCOSAL
  Filled 2020-04-30: qty 15

## 2020-04-30 MED ORDER — BIOTIN 10 MG PO CAPS: 10.0000 mg | ORAL_CAPSULE | Freq: Every day | ORAL | Status: DC

## 2020-04-30 MED ORDER — SODIUM CHLORIDE 0.9 % IV SOLN
INTRAVENOUS | Status: DC | PRN
  Administered 2020-04-30: 500 mL

## 2020-04-30 MED ORDER — MIDAZOLAM HCL 2 MG/2ML IJ SOLN
INTRAMUSCULAR | Status: AC
  Filled 2020-04-30: qty 2

## 2020-04-30 MED ORDER — FENTANYL CITRATE (PF) 250 MCG/5ML IJ SOLN
INTRAMUSCULAR | Status: AC
  Filled 2020-04-30: qty 5

## 2020-04-30 MED ORDER — PROPOFOL 10 MG/ML IV BOLUS
INTRAVENOUS | Status: DC | PRN
  Administered 2020-04-30: 140 mg via INTRAVENOUS

## 2020-04-30 MED ORDER — ACETAMINOPHEN 500 MG PO TABS
ORAL_TABLET | ORAL | Status: AC
  Filled 2020-04-30: qty 2

## 2020-04-30 MED ORDER — MORPHINE SULFATE (PF) 2 MG/ML IV SOLN: 2.0000 mg | INTRAVENOUS | Status: DC | PRN

## 2020-04-30 MED ORDER — DEXAMETHASONE SODIUM PHOSPHATE 10 MG/ML IJ SOLN
10.0000 mg | Freq: Once | INTRAMUSCULAR | Status: AC
  Administered 2020-04-30: 10 mg via INTRAVENOUS

## 2020-04-30 MED ORDER — THROMBIN 5000 UNITS EX SOLR
OROMUCOSAL | Status: DC | PRN
  Administered 2020-04-30: 5 mL via TOPICAL

## 2020-04-30 MED ORDER — SUGAMMADEX SODIUM 200 MG/2ML IV SOLN
INTRAVENOUS | Status: DC | PRN
Start: 2020-04-30 — End: 2020-04-30
  Administered 2020-04-30: 400 mg via INTRAVENOUS

## 2020-04-30 MED ORDER — PREGABALIN 75 MG PO CAPS
150.0000 mg | ORAL_CAPSULE | Freq: Two times a day (BID) | ORAL | Status: DC
  Administered 2020-04-30: 150 mg via ORAL
  Filled 2020-04-30: qty 2

## 2020-04-30 MED ORDER — PHENOL 1.4 % MT LIQD: 1.0000 | OROMUCOSAL | Status: DC | PRN

## 2020-04-30 MED ORDER — INSULIN ASPART 100 UNIT/ML ~~LOC~~ SOLN
0.0000 [IU] | Freq: Every day | SUBCUTANEOUS | Status: DC
  Administered 2020-04-30: 5 [IU] via SUBCUTANEOUS

## 2020-04-30 MED ORDER — INSULIN ASPART 100 UNIT/ML ~~LOC~~ SOLN
0.0000 [IU] | Freq: Three times a day (TID) | SUBCUTANEOUS | Status: DC
  Administered 2020-05-01: 8 [IU] via SUBCUTANEOUS

## 2020-04-30 MED ORDER — DEXMEDETOMIDINE (PRECEDEX) IN NS 20 MCG/5ML (4 MCG/ML) IV SYRINGE
PREFILLED_SYRINGE | INTRAVENOUS | Status: DC | PRN
  Administered 2020-04-30: 20 ug via INTRAVENOUS

## 2020-04-30 MED ORDER — CHLORHEXIDINE GLUCONATE CLOTH 2 % EX PADS: 6.0000 | MEDICATED_PAD | Freq: Once | CUTANEOUS | Status: DC

## 2020-04-30 MED ORDER — HYDROCODONE-ACETAMINOPHEN 7.5-325 MG PO TABS
1.0000 | ORAL_TABLET | ORAL | Status: DC | PRN
  Administered 2020-04-30 – 2020-05-01 (×2): 1 via ORAL
  Filled 2020-04-30 (×2): qty 1

## 2020-04-30 MED ORDER — NITROGLYCERIN 0.4 MG SL SUBL: 0.4000 mg | SUBLINGUAL_TABLET | SUBLINGUAL | Status: DC | PRN

## 2020-04-30 MED ORDER — CHLORHEXIDINE GLUCONATE 0.12 % MT SOLN: 15.0000 mL | Freq: Once | OROMUCOSAL | Status: AC

## 2020-04-30 MED ORDER — CYCLOBENZAPRINE HCL 10 MG PO TABS
10.0000 mg | ORAL_TABLET | Freq: Three times a day (TID) | ORAL | Status: DC | PRN
  Administered 2020-04-30: 10 mg via ORAL
  Filled 2020-04-30: qty 1

## 2020-04-30 MED ORDER — PHENYLEPHRINE HCL (PRESSORS) 10 MG/ML IV SOLN
INTRAVENOUS | Status: DC | PRN
  Administered 2020-04-30: 120 ug via INTRAVENOUS
  Administered 2020-04-30: 80 ug via INTRAVENOUS

## 2020-04-30 MED ORDER — DEXMEDETOMIDINE (PRECEDEX) IN NS 20 MCG/5ML (4 MCG/ML) IV SYRINGE
PREFILLED_SYRINGE | INTRAVENOUS | Status: AC
  Filled 2020-04-30: qty 5

## 2020-04-30 MED ORDER — CEFAZOLIN SODIUM-DEXTROSE 2-4 GM/100ML-% IV SOLN
2.0000 g | Freq: Three times a day (TID) | INTRAVENOUS | Status: AC
  Administered 2020-04-30 – 2020-05-01 (×2): 2 g via INTRAVENOUS
  Filled 2020-04-30 (×2): qty 100

## 2020-04-30 MED ORDER — INSULIN ASPART 100 UNIT/ML ~~LOC~~ SOLN: 0.0000 [IU] | Freq: Three times a day (TID) | SUBCUTANEOUS | Status: DC

## 2020-04-30 MED ORDER — BUPIVACAINE HCL (PF) 0.25 % IJ SOLN
INTRAMUSCULAR | Status: DC | PRN
  Administered 2020-04-30: 10 mL

## 2020-04-30 MED ORDER — ROCURONIUM BROMIDE 100 MG/10ML IV SOLN
INTRAVENOUS | Status: DC | PRN
  Administered 2020-04-30: 60 mg via INTRAVENOUS
  Administered 2020-04-30: 30 mg via INTRAVENOUS
  Administered 2020-04-30: 40 mg via INTRAVENOUS

## 2020-04-30 MED ORDER — KETAMINE HCL 10 MG/ML IJ SOLN
INTRAMUSCULAR | Status: DC | PRN
  Administered 2020-04-30: 20 mg via INTRAVENOUS
  Administered 2020-04-30: 10 mg via INTRAVENOUS
  Administered 2020-04-30: 20 mg via INTRAVENOUS

## 2020-04-30 MED ORDER — VERAPAMIL HCL ER 240 MG PO TBCR
240.0000 mg | EXTENDED_RELEASE_TABLET | Freq: Two times a day (BID) | ORAL | Status: DC
  Administered 2020-04-30: 240 mg via ORAL
  Filled 2020-04-30 (×2): qty 1

## 2020-04-30 MED ORDER — METFORMIN HCL 500 MG PO TABS
1000.0000 mg | ORAL_TABLET | Freq: Two times a day (BID) | ORAL | Status: DC
  Filled 2020-04-30: qty 2

## 2020-04-30 MED ORDER — ORAL CARE MOUTH RINSE: 15.0000 mL | Freq: Once | OROMUCOSAL | Status: AC

## 2020-04-30 MED ORDER — THROMBIN 20000 UNITS EX SOLR
CUTANEOUS | Status: AC
  Filled 2020-04-30: qty 20000

## 2020-04-30 MED ORDER — POTASSIUM CHLORIDE IN NACL 20-0.9 MEQ/L-% IV SOLN: INTRAVENOUS | Status: DC

## 2020-04-30 MED ORDER — SENNA 8.6 MG PO TABS
1.0000 | ORAL_TABLET | Freq: Two times a day (BID) | ORAL | Status: DC
  Administered 2020-04-30: 8.6 mg via ORAL
  Filled 2020-04-30: qty 1

## 2020-04-30 MED ORDER — SODIUM CHLORIDE 0.9 % IV SOLN: 250.0000 mL | INTRAVENOUS | Status: DC

## 2020-04-30 MED ORDER — HYDRALAZINE HCL 50 MG PO TABS
100.0000 mg | ORAL_TABLET | Freq: Three times a day (TID) | ORAL | Status: DC
  Administered 2020-04-30: 100 mg via ORAL
  Filled 2020-04-30 (×2): qty 2

## 2020-04-30 MED ORDER — CEFAZOLIN SODIUM-DEXTROSE 2-4 GM/100ML-% IV SOLN
INTRAVENOUS | Status: AC
  Filled 2020-04-30: qty 100

## 2020-04-30 MED ORDER — PHENYLEPHRINE HCL-NACL 10-0.9 MG/250ML-% IV SOLN
INTRAVENOUS | Status: DC | PRN
  Administered 2020-04-30: 40 ug/min via INTRAVENOUS

## 2020-04-30 MED ORDER — ONDANSETRON HCL 4 MG PO TABS: 4.0000 mg | ORAL_TABLET | Freq: Four times a day (QID) | ORAL | Status: DC | PRN

## 2020-04-30 MED ORDER — SODIUM CHLORIDE 0.9% FLUSH: 3.0000 mL | INTRAVENOUS | Status: DC | PRN

## 2020-04-30 MED ORDER — ONDANSETRON HCL 4 MG/2ML IJ SOLN
INTRAMUSCULAR | Status: DC | PRN
  Administered 2020-04-30: 4 mg via INTRAVENOUS

## 2020-04-30 MED ORDER — LIDOCAINE HCL (CARDIAC) PF 100 MG/5ML IV SOSY
PREFILLED_SYRINGE | INTRAVENOUS | Status: DC | PRN
  Administered 2020-04-30: 100 mg via INTRAVENOUS

## 2020-04-30 MED ORDER — BUPIVACAINE HCL (PF) 0.25 % IJ SOLN
INTRAMUSCULAR | Status: AC
  Filled 2020-04-30: qty 30

## 2020-04-30 MED ORDER — ACETAMINOPHEN 650 MG RE SUPP: 650.0000 mg | RECTAL | Status: DC | PRN

## 2020-04-30 MED ORDER — KETOROLAC TROMETHAMINE 30 MG/ML IJ SOLN
INTRAMUSCULAR | Status: AC
  Filled 2020-04-30: qty 1

## 2020-04-30 MED ORDER — KETAMINE HCL 50 MG/5ML IJ SOSY
PREFILLED_SYRINGE | INTRAMUSCULAR | Status: AC
  Filled 2020-04-30: qty 5

## 2020-04-30 MED ORDER — ACETAMINOPHEN 325 MG PO TABS: 650.0000 mg | ORAL_TABLET | ORAL | Status: DC | PRN

## 2020-04-30 MED ORDER — ONDANSETRON HCL 4 MG/2ML IJ SOLN
INTRAMUSCULAR | Status: AC
  Filled 2020-04-30: qty 2

## 2020-04-30 MED ORDER — ONDANSETRON HCL 4 MG/2ML IJ SOLN: 4.0000 mg | Freq: Four times a day (QID) | INTRAMUSCULAR | Status: DC | PRN

## 2020-04-30 MED ORDER — MENTHOL 3 MG MT LOZG: 1.0000 | LOZENGE | OROMUCOSAL | Status: DC | PRN

## 2020-04-30 MED ORDER — PROPOFOL 10 MG/ML IV BOLUS
INTRAVENOUS | Status: AC
  Filled 2020-04-30: qty 20

## 2020-04-30 MED ORDER — FENTANYL CITRATE (PF) 250 MCG/5ML IJ SOLN
INTRAMUSCULAR | Status: DC | PRN
  Administered 2020-04-30 (×2): 50 ug via INTRAVENOUS
  Administered 2020-04-30: 100 ug via INTRAVENOUS
  Administered 2020-04-30: 50 ug via INTRAVENOUS

## 2020-04-30 MED ORDER — THROMBIN 20000 UNITS EX KIT
PACK | CUTANEOUS | Status: DC | PRN
  Administered 2020-04-30: 20 mL via TOPICAL

## 2020-04-30 MED ORDER — THROMBIN 5000 UNITS EX SOLR
CUTANEOUS | Status: AC
  Filled 2020-04-30: qty 5000

## 2020-04-30 SURGICAL SUPPLY — 47 items
BAG DECANTER FOR FLEXI CONT (MISCELLANEOUS) ×3 IMPLANT
BAND RUBBER #18 3X1/16 STRL (MISCELLANEOUS) ×6 IMPLANT
BENZOIN TINCTURE PRP APPL 2/3 (GAUZE/BANDAGES/DRESSINGS) ×3 IMPLANT
BUR CARBIDE MATCH 3.0 (BURR) ×3 IMPLANT
CANISTER SUCT 3000ML PPV (MISCELLANEOUS) ×3 IMPLANT
CARTRIDGE OIL MAESTRO DRILL (MISCELLANEOUS) ×1 IMPLANT
CLOSURE STERI-STRIP 1/2X4 (GAUZE/BANDAGES/DRESSINGS) ×1
CLOSURE WOUND 1/2 X4 (GAUZE/BANDAGES/DRESSINGS) ×1
CLSR STERI-STRIP ANTIMIC 1/2X4 (GAUZE/BANDAGES/DRESSINGS) ×2 IMPLANT
COVER WAND RF STERILE (DRAPES) IMPLANT
DIFFUSER DRILL AIR PNEUMATIC (MISCELLANEOUS) ×3 IMPLANT
DRAPE LAPAROTOMY 100X72X124 (DRAPES) ×3 IMPLANT
DRAPE MICROSCOPE LEICA (MISCELLANEOUS) ×3 IMPLANT
DRAPE SURG 17X23 STRL (DRAPES) ×3 IMPLANT
DRSG OPSITE POSTOP 4X6 (GAUZE/BANDAGES/DRESSINGS) ×3 IMPLANT
DURAPREP 26ML APPLICATOR (WOUND CARE) ×3 IMPLANT
ELECT REM PT RETURN 9FT ADLT (ELECTROSURGICAL) ×3
ELECTRODE REM PT RTRN 9FT ADLT (ELECTROSURGICAL) ×1 IMPLANT
GAUZE 4X4 16PLY RFD (DISPOSABLE) IMPLANT
GLOVE BIO SURGEON STRL SZ7 (GLOVE) IMPLANT
GLOVE BIO SURGEON STRL SZ8 (GLOVE) ×3 IMPLANT
GLOVE BIOGEL PI IND STRL 7.0 (GLOVE) IMPLANT
GLOVE BIOGEL PI INDICATOR 7.0 (GLOVE)
GOWN STRL REUS W/ TWL LRG LVL3 (GOWN DISPOSABLE) IMPLANT
GOWN STRL REUS W/ TWL XL LVL3 (GOWN DISPOSABLE) ×1 IMPLANT
GOWN STRL REUS W/TWL 2XL LVL3 (GOWN DISPOSABLE) IMPLANT
GOWN STRL REUS W/TWL LRG LVL3 (GOWN DISPOSABLE)
GOWN STRL REUS W/TWL XL LVL3 (GOWN DISPOSABLE) ×2
HEMOSTAT POWDER KIT SURGIFOAM (HEMOSTASIS) ×3 IMPLANT
KIT BASIN OR (CUSTOM PROCEDURE TRAY) ×3 IMPLANT
KIT TURNOVER KIT B (KITS) ×3 IMPLANT
NEEDLE HYPO 25X1 1.5 SAFETY (NEEDLE) ×3 IMPLANT
NEEDLE SPNL 20GX3.5 QUINCKE YW (NEEDLE) ×3 IMPLANT
NS IRRIG 1000ML POUR BTL (IV SOLUTION) ×3 IMPLANT
OIL CARTRIDGE MAESTRO DRILL (MISCELLANEOUS) ×3
PACK LAMINECTOMY NEURO (CUSTOM PROCEDURE TRAY) ×3 IMPLANT
PAD ARMBOARD 7.5X6 YLW CONV (MISCELLANEOUS) ×9 IMPLANT
SPONGE SURGIFOAM ABS GEL 100 (HEMOSTASIS) ×3 IMPLANT
SPONGE SURGIFOAM ABS GEL SZ50 (HEMOSTASIS) IMPLANT
STRIP CLOSURE SKIN 1/2X4 (GAUZE/BANDAGES/DRESSINGS) ×2 IMPLANT
SUT VIC AB 0 CT1 18XCR BRD8 (SUTURE) ×1 IMPLANT
SUT VIC AB 0 CT1 8-18 (SUTURE) ×2
SUT VIC AB 2-0 CP2 18 (SUTURE) ×3 IMPLANT
SUT VIC AB 3-0 SH 8-18 (SUTURE) ×3 IMPLANT
TOWEL GREEN STERILE (TOWEL DISPOSABLE) ×3 IMPLANT
TOWEL GREEN STERILE FF (TOWEL DISPOSABLE) ×3 IMPLANT
WATER STERILE IRR 1000ML POUR (IV SOLUTION) ×3 IMPLANT

## 2020-04-30 NOTE — Transfer of Care (Signed)
Immediate Anesthesia Transfer of Care Note  Patient: Morgan Patel  Procedure(s) Performed: Laminectomy and Foraminotomy  Lumbar Five-Sacral One - left,  Lumbar Three- Lumbar Four, Lumbar Four- Lumbar Five - bilateral (Left Back)  Patient Location: PACU  Anesthesia Type:General  Level of Consciousness: awake, alert  and oriented  Airway & Oxygen Therapy: Patient Spontanous Breathing and Patient connected to nasal cannula oxygen  Post-op Assessment: Report given to RN, Post -op Vital signs reviewed and stable and Patient moving all extremities  Post vital signs: Reviewed and stable  Last Vitals:  Vitals Value Taken Time  BP 132/85 04/30/20 1753  Temp 36.3 C 04/30/20 1753  Pulse 101 04/30/20 1802  Resp 20 04/30/20 1802  SpO2 89 % 04/30/20 1802  Vitals shown include unvalidated device data.  Last Pain:  Vitals:   04/30/20 1226  TempSrc:   PainSc: 7       Patients Stated Pain Goal: 3 (04/30/20 1226)  Complications: No complications documented.

## 2020-04-30 NOTE — H&P (Signed)
Subjective: Patient is a 49 y.o. female admitted for stenosis lumbar. Onset of symptoms was several months ago, gradually worsening since that time.  The pain is rated severe, and is located at the across the lower back and radiates to legs. The pain is described as aching and occurs all day. The symptoms have been progressive. Symptoms are exacerbated by exercise. MRI or CT showed stenosis   Past Medical History:  Diagnosis Date  . Abscess of breast, right 02/04/2015  . Acid reflux   . Anxiety   . DDD (degenerative disc disease), lumbar   . Depression   . Diabetes mellitus without complication (HCC)    Type II  . Hyperlipidemia   . Hypertension   . MI (myocardial infarction) (HCC) 2015  . Short of breath on exertion    per patient get short of breath with activity  . Stroke [facility] 2017   patient can't remember if it was 2016 or 2017    Past Surgical History:  Procedure Laterality Date  . CARDIAC CATHETERIZATION    . TUBAL LIGATION      Prior to Admission medications   Medication Sig Start Date End Date Taking? Authorizing Provider  aspirin 81 MG chewable tablet Chew 81 mg by mouth daily.   Yes [provider]  Aspirin-Caffeine (BAYER BACK & BODY PO) Take 3 tablets by mouth daily as needed (pain).   Yes [provider]  atorvastatin (LIPITOR) 80 MG tablet Take 80 mg by mouth daily. 09/29/16  Yes [provider]  Biotin 10 MG CAPS Take 10 mg by mouth daily.   Yes [provider]  cyclobenzaprine (FLEXERIL) 10 MG tablet Take 10 mg by mouth 3 (three) times daily as needed for muscle spasms.  05/01/17  Yes [provider]  DULoxetine (CYMBALTA) 60 MG capsule Take 60 mg by mouth 2 (two) times daily.   Yes [provider]  fexofenadine (ALLEGRA) 180 MG tablet Take 180 mg by mouth daily.   Yes [provider]  fluticasone (FLONASE) 50 MCG/ACT nasal spray Place 1 spray into both nostrils daily as needed for allergies.  07/21/16  04/02/20 Yes [provider]  glipiZIDE (GLUCOTROL) 10 MG tablet Take 10 mg by mouth 2 (two) times daily before a meal.   Yes [provider]  hydrALAZINE (APRESOLINE) 100 MG tablet Take 100 mg by mouth 3 (three) times daily.   Yes [provider]  ibuprofen (ADVIL) 800 MG tablet Take 800 mg by mouth 3 (three) times daily.   Yes [provider]  loratadine (CLARITIN) 10 MG tablet Take 10 mg by mouth at bedtime.   Yes [provider]  metFORMIN (GLUCOPHAGE) 500 MG tablet Take 1,000 mg by mouth 2 (two) times daily with a meal.    Yes [provider]  nitroGLYCERIN (NITROSTAT) 0.4 MG SL tablet Place 0.4 mg under the tongue every 5 (five) minutes as needed for chest pain.    Yes [provider]  Omega-3 Fatty Acids (OMEGA 3 500) 500 MG CAPS Take 500 mg by mouth daily.   Yes [provider]  pregabalin (LYRICA) 150 MG capsule Take 1 capsule (150 mg total) by mouth 3 (three) times daily. Patient taking differently: Take 150 mg by mouth 2 (two) times daily.  12/02/17 04/02/20 Yes Delano Metz, MD  verapamil (CALAN-SR) 240 MG CR tablet Take 240 mg by mouth 2 (two) times daily.   Yes [provider]  vitamin B-12 (CYANOCOBALAMIN) 500 MCG tablet Take 500  mcg by mouth daily.   Yes [provider]  glucose blood (FREESTYLE TEST STRIPS) test strip 1 strip as needed. 02/07/16   [provider]   Allergies  Allergen Reactions  . Amlodipine Rash  . Losartan Rash     rash on feet and ankles  . Quinapril Rash  . Hydrochlorothiazide Other (See Comments)    Causes headache.  . Atenolol Rash  . Penicillins Nausea And Vomiting    Social History   Tobacco Use  . Smoking status: Current Every Day Smoker    Packs/day: 0.50    Types: Cigarettes  . Smokeless tobacco: Never Used  Substance Use Topics  . Alcohol use: No    Family History  Problem Relation Age of Onset  . Hypertension Mother   . Diabetes  Mother   . Osteoarthritis Mother   . Hypertension Father   . Stroke Father   . Osteoarthritis Father   . Cancer Brother      Review of Systems  Positive ROS: neg  All other systems have been reviewed and were otherwise negative with the exception of those mentioned in the HPI and as above.  Objective: Vital signs in last 24 hours: Temp:  [97.6 F (36.4 C)] 97.6 F (36.4 C) (08/09 1133) Pulse Rate:  [94] 94 (08/09 1133) Resp:  [18] 18 (08/09 1133) BP: (182)/(96) 182/96 (08/09 1133) SpO2:  [96 %] 96 % (08/09 1133) Weight:  [115.1 kg] 115.1 kg (08/09 1133)  General Appearance: Alert, cooperative, no distress, appears stated age Head: Normocephalic, without obvious abnormality, atraumatic Eyes: PERRL, conjunctiva/corneas clear, EOM's intact    Neck: Supple, symmetrical, trachea midline Back: Symmetric, no curvature, ROM normal, no CVA tenderness Lungs:  respirations unlabored Heart: Regular rate and rhythm Abdomen: Soft, non-tender Extremities: Extremities normal, atraumatic, no cyanosis or edema Pulses: 2+ and symmetric all extremities Skin: Skin color, texture, turgor normal, no rashes or lesions  NEUROLOGIC:   Mental status: Alert and oriented x4,  no aphasia, good attention span, fund of knowledge, and memory Motor Exam - grossly normal Sensory Exam - grossly normal Reflexes: 1+ Coordination - grossly normal Gait - grossly normal Balance - grossly normal Cranial Nerves: I: smell Not tested  II: visual acuity  OS: nl    OD: nl  II: visual fields Full to confrontation  II: pupils Equal, round, reactive to light  III,VII: ptosis None  III,IV,VI: extraocular muscles  Full ROM  V: mastication Normal  V: facial light touch sensation  Normal  V,VII: corneal reflex  Present  VII: facial muscle function - upper  Normal  VII: facial muscle function - lower Normal  VIII: hearing Not tested  IX: soft palate elevation  Normal  IX,X: gag reflex Present  XI: trapezius  strength  5/5  XI: sternocleidomastoid strength 5/5  XI: neck flexion strength  5/5  XII: tongue strength  Normal    Data Review Lab Results  Component Value Date   WBC 11.2 (H) 04/25/2020   HGB 13.3 04/25/2020   HCT 41.0 04/25/2020   MCV 95.6 04/25/2020   PLT 297 04/25/2020   Lab Results  Component Value Date   NA 139 04/25/2020   K 3.5 04/25/2020   CL 105 04/25/2020   CO2 26 04/25/2020   BUN 12 04/25/2020   CREATININE 0.73 04/25/2020   GLUCOSE 245 (H) 04/25/2020   Lab Results  Component Value Date   INR 1.0 04/12/2020    Assessment/Plan:  Estimated body mass index is 46.42 kg/m  as calculated from the following:   Height as of this encounter: 5\' 2"  (1.575 m).   Weight as of this encounter: 115.1 kg. Patient admitted for LL for stenosis. Patient has failed a reasonable attempt at conservative therapy.  I explained the condition and procedure to the patient and answered any questions.  Patient wishes to proceed with procedure as planned. Understands risks/ benefits and typical outcomes of procedure.   04/30/2020 2:16 PM

## 2020-04-30 NOTE — Anesthesia Procedure Notes (Signed)
Procedure Name: Intubation Date/Time: 04/30/2020 3:04 PM Performed by: Lavonda Thal T, CRNA Pre-anesthesia Checklist: Patient identified, Emergency Drugs available, Suction available and Patient being monitored Patient Re-evaluated:Patient Re-evaluated prior to induction Oxygen Delivery Method: Circle system utilized Preoxygenation: Pre-oxygenation with 100% oxygen Induction Type: IV induction Ventilation: Mask ventilation without difficulty and Oral airway inserted - appropriate to patient size Laryngoscope Size: Glidescope and 3 Grade View: Grade II Tube type: Oral Tube size: 7.5 mm Number of attempts: 1 Airway Equipment and Method: Stylet,  Oral airway and Video-laryngoscopy Placement Confirmation: ETT inserted through vocal cords under direct vision,  positive ETCO2 and breath sounds checked- equal and bilateral Secured at: 21 cm Tube secured with: Tape Dental Injury: Teeth and Oropharynx as per pre-operative assessment  Difficulty Due To: Difficulty was unanticipated and Difficult Airway-  due to edematous airway

## 2020-04-30 NOTE — Anesthesia Postprocedure Evaluation (Signed)
Anesthesia Post Note  Patient: Morgan Patel  Procedure(s) Performed: Laminectomy and Foraminotomy  Lumbar Five-Sacral One - left,  Lumbar Three- Lumbar Four, Lumbar Four- Lumbar Five - bilateral (Left Back)     Patient location during evaluation: PACU Anesthesia Type: General Level of consciousness: awake and alert Pain management: pain level controlled Vital Signs Assessment: post-procedure vital signs reviewed and stable Respiratory status: spontaneous breathing, nonlabored ventilation, respiratory function stable and patient connected to nasal cannula oxygen Cardiovascular status: blood pressure returned to baseline and stable Postop Assessment: no apparent nausea or vomiting Anesthetic complications: no   No complications documented.  Last Vitals:  Vitals:   04/30/20 1846 04/30/20 1953  BP: 117/84 (!) 123/59  Pulse: 92 85  Resp: 20 16  Temp: 36.7 C 36.6 C  SpO2: 95% 97%    Last Pain:  Vitals:   04/30/20 1953  TempSrc: Oral  PainSc:                  Cecile Hearing

## 2020-04-30 NOTE — Op Note (Signed)
04/30/2020  5:26 PM  PATIENT:  Morgan Patel  49 y.o. female  PRE-OPERATIVE DIAGNOSIS: Lumbar spinal stenosis L3-4 L4-5 L5-S1 with back and left worse than right radiculopathy  POST-OPERATIVE DIAGNOSIS:  same  PROCEDURE: Decompressive lumbar hemilaminectomy medial facetectomy and foraminotomies L3-4 L4-5 L5-S1 left, L4-5 right, sublaminar decompression L3-4 from the left, discectomy L4-5 left  SURGEON:  Marikay Alar, MD  ASSISTANTS: Verlin Dike, FNP  ANESTHESIA:   General  EBL: 200 ml  Total I/O In: 1000 [I.V.:1000] Out: 200 [Blood:200]  BLOOD ADMINISTERED: none  DRAINS: None  SPECIMEN:  none  INDICATION FOR PROCEDURE: This patient presented with bilateral leg pain left much more than right. Imaging showed spinal stenosis L3-4 L4-5 L5-S1. The patient tried conservative measures without relief. Pain was debilitating. Recommended decompressive lumbar hemilaminectomies. Patient understood the risks, benefits, and alternatives and potential outcomes and wished to proceed.  PROCEDURE DETAILS: The patient was taken to the operating room and after induction of adequate generalized endotracheal anesthesia, the patient was rolled into the prone position on the Wilson frame and all pressure points were padded. The lumbar region was cleaned and then prepped with DuraPrep and draped in the usual sterile fashion. 5 cc of local anesthesia was injected and then a dorsal midline incision was made and carried down to the lumbo sacral fascia. The fascia was opened and the paraspinous musculature was taken down in a subperiosteal fashion to expose L3-4 L4-5 and L5-S1 on the left and L4-5 on the right. Intraoperative x-ray confirmed my level, and then I used a combination of the high-speed drill and the Kerrison punches to perform a hemilaminectomy, medial facetectomy, and foraminotomy at L3-4 L4-5 and L5-S1 on the left, and at L4-5 on the right.  At L3-4 I drilled up under the spinous process and  was able to perform a sublaminar decompression from the left to decompress the right lateral recess.  The underlying yellow ligament was opened and removed in a piecemeal fashion to expose the underlying dura and exiting nerve root at each level. I undercut the lateral recess and dissected down until I was medial to and distal to the pedicle at each level. The nerve root was well decompressed. We then gently retracted the nerve root medially with a retractor, coagulated the epidural venous vasculature, and inspected the disc space at each level.  There were disc bulges at each level but nothing overly compressive except for at L4-5 on the left where I felt there was a subannular herniation.  I performed a cruciate annular incision and used a nerve hook to try to do a subannular discectomy.  I was able to remove some disc herniation but I would have had to do a much larger annulotomy and performed a more thorough intradiscal discectomy to get more disc than this and I did not feel that the potential benefits outweighed the potential disadvantages of this procedure.  I then palpated with a coronary dilator along the nerve root and into the foramen to assure adequate decompression at each level. I felt no more compression of the nerve root. I irrigated with saline solution containing bacitracin. Achieved hemostasis with bipolar cautery, lined the dura with Gelfoam, and then closed the fascia with 0 Vicryl. I closed the subcutaneous tissues with 2-0 Vicryl and the subcuticular tissues with 3-0 Vicryl. The skin was then closed with benzoin and Steri-Strips. The drapes were removed, a sterile dressing was applied.  My nurse practitioner was involved in the exposure, safe retraction of the  neural elements, the disc work and the closure. the patient was awakened from general anesthesia and transferred to the recovery room in stable condition. At the end of the procedure all sponge, needle and instrument counts were  correct.    PLAN OF CARE: Admit for overnight observation  PATIENT DISPOSITION:  PACU - hemodynamically stable.   Delay start of Pharmacological VTE agent (>24hrs) due to surgical blood loss or risk of bleeding:  yes

## 2020-05-01 ENCOUNTER — Encounter (HOSPITAL_COMMUNITY): Payer: Self-pay | Admitting: Neurological Surgery

## 2020-05-01 DIAGNOSIS — M5416 Radiculopathy, lumbar region: Secondary | ICD-10-CM | POA: Diagnosis not present

## 2020-05-01 LAB — GLUCOSE, CAPILLARY
Glucose-Capillary: 251 mg/dL — ABNORMAL HIGH (ref 70–99)
Glucose-Capillary: 309 mg/dL — ABNORMAL HIGH (ref 70–99)
Glucose-Capillary: 360 mg/dL — ABNORMAL HIGH (ref 70–99)
Glucose-Capillary: 397 mg/dL — ABNORMAL HIGH (ref 70–99)

## 2020-05-01 MED ORDER — GLIPIZIDE 5 MG PO TABS
10.0000 mg | ORAL_TABLET | Freq: Two times a day (BID) | ORAL | Status: DC
  Administered 2020-05-01 (×2): 10 mg via ORAL
  Filled 2020-05-01: qty 2

## 2020-05-01 MED ORDER — METHOCARBAMOL 500 MG PO TABS: 500.0000 mg | ORAL_TABLET | Freq: Four times a day (QID) | ORAL | 0 refills | Status: DC

## 2020-05-01 MED ORDER — HYDROCODONE-ACETAMINOPHEN 7.5-325 MG PO TABS: 1.0000 | ORAL_TABLET | ORAL | 0 refills | Status: DC | PRN

## 2020-05-01 MED ORDER — METFORMIN HCL 500 MG PO TABS
1000.0000 mg | ORAL_TABLET | Freq: Two times a day (BID) | ORAL | Status: DC
  Administered 2020-05-01: 1000 mg via ORAL

## 2020-05-01 NOTE — Discharge Instructions (Signed)

## 2020-05-01 NOTE — Discharge Summary (Signed)
Physician Discharge Summary  Patient ID: Morgan Patel MRN: [id number] DOB/AGE: [birth year] 50 y.o.  Admit date: 04/30/2020 Discharge date: 05/01/2020  Admission Diagnoses:  Lumbar spinal stenosis L3-4 L4-5 L5-S1 with back and left worse than right radiculopathy    Discharge Diagnoses: same   Discharged Condition: good  Hospital Course: The patient was admitted on 04/30/2020 and taken to the operating room where the patient underwent left hemilam L5-S1, bilater hemilam L4-5, left hemilam with sublaminar L3-4. The patient tolerated the procedure well and was taken to the recovery room and then to the floor in stable condition. The hospital course was routine. There were no complications. The wound remained clean dry and intact. Pt had appropriate back soreness. No complaints of leg pain or new N/T/W. The patient remained afebrile with stable vital signs, and tolerated a regular diet. The patient continued to increase activities, and pain was well controlled with oral pain medications.   Consults: None  Significant Diagnostic Studies:  Results for orders placed or performed during the hospital encounter of 04/30/20  SARS Coronavirus 2 by RT PCR (hospital order, performed in [facility] hospital lab) Nasopharyngeal Nasopharyngeal Swab   Specimen: Nasopharyngeal Swab  Result Value Ref Range   SARS Coronavirus 2 NEGATIVE NEGATIVE  Glucose, capillary  Result Value Ref Range   Glucose-Capillary 204 (H) 70 - 99 mg/dL  Glucose, capillary  Result Value Ref Range   Glucose-Capillary 166 (H) 70 - 99 mg/dL  Glucose, capillary  Result Value Ref Range   Glucose-Capillary 169 (H) 70 - 99 mg/dL   Comment 1 Notify RN   Glucose, capillary  Result Value Ref Range   Glucose-Capillary 404 (H) 70 - 99 mg/dL   Comment 1 Notify RN    Comment 2 Document in Chart   Glucose, capillary  Result Value Ref Range   Glucose-Capillary 397 (H) 70 - 99 mg/dL   Comment 1 Notify RN    Comment 2 Document in Chart    Glucose, capillary  Result Value Ref Range   Glucose-Capillary 360 (H) 70 - 99 mg/dL  Glucose, capillary  Result Value Ref Range   Glucose-Capillary 309 (H) 70 - 99 mg/dL   Comment 1 Notify RN    Comment 2 Document in Chart   Glucose, capillary  Result Value Ref Range   Glucose-Capillary 251 (H) 70 - 99 mg/dL   Comment 1 Notify RN    Comment 2 Document in Chart     Chest 2 View  Result Date: 04/13/2020 CLINICAL DATA:  Low back pain. EXAM: CHEST - 2 VIEW COMPARISON:  09/19/2014 FINDINGS: Lungs are adequately inflated and otherwise clear. Cardiomediastinal silhouette and remainder of the exam is unchanged. IMPRESSION: No active cardiopulmonary disease. Electronically Signed   By: Elberta Fortis M.D.   On: 04/13/2020 12:28   DG Lumbar Spine 2-3 Views  Result Date: 04/30/2020 CLINICAL DATA:  L3-S1 laminectomy EXAM: LUMBAR SPINE - 2-3 VIEW COMPARISON:  06/15/2017 FINDINGS: 1st image demonstrates posterior needle directed at the L5-S1 disc space. Second lateral intraoperative image demonstrates posterior surgical instruments directed at L2-3 superiorly and L5 inferiorly. IMPRESSION: Intraoperative localization as above. Electronically Signed   By: Charlett Nose M.D.   On: 04/30/2020 17:45    Antibiotics:  Anti-infectives (From admission, onward)   Start     Dose/Rate Route Frequency Ordered Stop   04/30/20 2200  ceFAZolin (ANCEF) IVPB 2g/100 mL premix        2 g 200 mL/hr over 30 Minutes Intravenous Every 8 hours  04/30/20 1838 05/01/20 0530   04/30/20 1549  bacitracin 50,000 Units in sodium chloride 0.9 % 500 mL irrigation  Status:  Discontinued          As needed 04/30/20 1549 04/30/20 1746   04/30/20 1226  ceFAZolin (ANCEF) 2-4 GM/100ML-% IVPB  Status:  Discontinued       Note to Pharmacy: Gleason, Ginger   : cabinet override      04/30/20 1226 04/30/20 1235   04/16/20 0600  vancomycin (VANCOREADY) IVPB 1500 mg/300 mL        1,500 mg 150 mL/hr over 120 Minutes Intravenous On call to  O.R. 04/13/20 0711 04/17/20 0559      Discharge Exam: Blood pressure 120/86, pulse 90, temperature 98.3 F (36.8 C), temperature source Oral, resp. rate 18, height 5\' 2"  (1.575 m), weight 115.1 kg, SpO2 93 %. Neurologic: Grossly normal Ambulating and voiding well, incision cdi  Discharge Medications:   Allergies as of 05/01/2020      Reactions   Amlodipine Rash   Losartan Rash    rash on feet and ankles   Quinapril Rash   Hydrochlorothiazide Other (See Comments)   Causes headache.   Atenolol Rash   Penicillins Nausea And Vomiting      Medication List    TAKE these medications   aspirin 81 MG chewable tablet Chew 81 mg by mouth daily.   atorvastatin 80 MG tablet Commonly known as: LIPITOR Take 80 mg by mouth daily.   BAYER BACK & BODY PO Take 3 tablets by mouth daily as needed (pain).   Biotin 10 MG Caps Take 10 mg by mouth daily.   cyclobenzaprine 10 MG tablet Commonly known as: FLEXERIL Take 10 mg by mouth 3 (three) times daily as needed for muscle spasms.   DULoxetine 60 MG capsule Commonly known as: CYMBALTA Take 60 mg by mouth 2 (two) times daily.   fexofenadine 180 MG tablet Commonly known as: ALLEGRA Take 180 mg by mouth daily.   fluticasone 50 MCG/ACT nasal spray Commonly known as: FLONASE Place 1 spray into both nostrils daily as needed for allergies.   FREESTYLE TEST STRIPS test strip Generic drug: glucose blood 1 strip as needed.   glipiZIDE 10 MG tablet Commonly known as: GLUCOTROL Take 10 mg by mouth 2 (two) times daily before a meal.   hydrALAZINE 100 MG tablet Commonly known as: APRESOLINE Take 100 mg by mouth 3 (three) times daily.   HYDROcodone-acetaminophen 7.5-325 MG tablet Commonly known as: NORCO Take 1 tablet by mouth every 4 (four) hours as needed for severe pain.   ibuprofen 800 MG tablet Commonly known as: ADVIL Take 800 mg by mouth 3 (three) times daily.   loratadine 10 MG tablet Commonly known as: CLARITIN Take 10  mg by mouth at bedtime.   metFORMIN 500 MG tablet Commonly known as: GLUCOPHAGE Take 1,000 mg by mouth 2 (two) times daily with a meal.   methocarbamol 500 MG tablet Commonly known as: Robaxin Take 1 tablet (500 mg total) by mouth 4 (four) times daily.   nitroGLYCERIN 0.4 MG SL tablet Commonly known as: NITROSTAT Place 0.4 mg under the tongue every 5 (five) minutes as needed for chest pain.   Omega 3 500 500 MG Caps Take 500 mg by mouth daily.   pregabalin 150 MG capsule Commonly known as: LYRICA Take 1 capsule (150 mg total) by mouth 3 (three) times daily. What changed: when to take this   verapamil 240 MG CR tablet Commonly known  as: CALAN-SR Take 240 mg by mouth 2 (two) times daily.   vitamin B-12 500 MCG tablet Commonly known as: CYANOCOBALAMIN Take 500 mcg by mouth daily.       Disposition: home   Final Dx: left hemilam L5-S1, bilater hemilam L4-5, left hemilam with sublaminar L3-4.   Discharge Instructions     Remove dressing in 72 hours   Complete by: As directed    Call MD for:  difficulty breathing, headache or visual disturbances   Complete by: As directed    Call MD for:  hives   Complete by: As directed    Call MD for:  persistant dizziness or light-headedness   Complete by: As directed    Call MD for:  persistant nausea and vomiting   Complete by: As directed    Call MD for:  redness, tenderness, or signs of infection (pain, swelling, redness, odor or green/yellow discharge around incision site)   Complete by: As directed    Call MD for:  severe uncontrolled pain   Complete by: As directed    Call MD for:  temperature >100.4   Complete by: As directed    Diet - low sodium heart healthy   Complete by: As directed    Driving Restrictions   Complete by: As directed    No driving for 2 weeks, no riding in the car for 1 week   Increase activity slowly   Complete by: As directed    Lifting restrictions   Complete by: As directed    No lifting more  than 8 lbs         Signed: Tiana Loft Hadlee Burback 05/01/2020, 8:05 AM

## 2020-05-01 NOTE — Progress Notes (Signed)
Patient is discharged from room 3C09 at this time. Alert and in stable condition. Patient very eager to leave. Got dressed before paper work was done states she wants to go outside for fresh air and smoke a cigarette. Patient insisted on going downstairs to wait for her ride. States she feels like she is in prison and wants to leave. Instructions read to patient with understanding verbalized and all questions answered. Ambulate out of unit with all belongings at side.

## 2020-06-26 DIAGNOSIS — M542 Cervicalgia: Secondary | ICD-10-CM | POA: Insufficient documentation

## 2020-06-26 DIAGNOSIS — M48062 Spinal stenosis, lumbar region with neurogenic claudication: Secondary | ICD-10-CM | POA: Insufficient documentation

## 2020-07-26 DIAGNOSIS — M4316 Spondylolisthesis, lumbar region: Secondary | ICD-10-CM | POA: Insufficient documentation

## 2020-08-22 DIAGNOSIS — M47816 Spondylosis without myelopathy or radiculopathy, lumbar region: Secondary | ICD-10-CM | POA: Insufficient documentation

## 2020-09-10 DIAGNOSIS — L0292 Furuncle, unspecified: Secondary | ICD-10-CM | POA: Insufficient documentation

## 2020-10-23 DIAGNOSIS — M961 Postlaminectomy syndrome, not elsewhere classified: Secondary | ICD-10-CM | POA: Insufficient documentation

## 2021-01-14 ENCOUNTER — Ambulatory Visit: Payer: Medicare Other | Admitting: Pain Medicine

## 2021-02-21 ENCOUNTER — Ambulatory Visit: Payer: Medicare Other | Admitting: Pain Medicine

## 2021-04-07 NOTE — Progress Notes (Deleted)
Did not keep her appointment on 04/08/2021.

## 2021-04-08 ENCOUNTER — Ambulatory Visit: Payer: Medicare Other | Admitting: Pain Medicine

## 2021-12-23 ENCOUNTER — Other Ambulatory Visit: Payer: Self-pay

## 2021-12-23 ENCOUNTER — Emergency Department
Admission: EM | Admit: 2021-12-23 | Discharge: 2021-12-23 | Disposition: A | Discharge status: Home / Self Care | Payer: Medicare Other | Attending: Emergency Medicine | Admitting: Emergency Medicine

## 2021-12-23 DIAGNOSIS — I2510 Atherosclerotic heart disease of native coronary artery without angina pectoris: Secondary | ICD-10-CM | POA: Insufficient documentation

## 2021-12-23 DIAGNOSIS — R040 Epistaxis: Secondary | ICD-10-CM | POA: Diagnosis present

## 2021-12-23 DIAGNOSIS — E119 Type 2 diabetes mellitus without complications: Secondary | ICD-10-CM | POA: Diagnosis not present

## 2021-12-23 DIAGNOSIS — I10 Essential (primary) hypertension: Secondary | ICD-10-CM | POA: Insufficient documentation

## 2021-12-23 MED ORDER — OXYMETAZOLINE HCL 0.05 % NA SOLN: 2.0000 | Freq: Two times a day (BID) | NASAL | 0 refills | Status: AC

## 2021-12-23 MED ORDER — LIDOCAINE VISCOUS HCL 2 % MT SOLN
15.0000 mL | Freq: Once | OROMUCOSAL | Status: DC
Start: 2021-12-23 — End: 2021-12-23
  Filled 2021-12-23: qty 15

## 2021-12-23 NOTE — Discharge Instructions (Signed)
Use the Afrin as prescribed over the next few days. ? ?If you have recurrent nosebleed, apply pressure with your fingers or the clip in front of the nasal bone.  Hold pressure for at least 15 minutes.  If the bleeding does not subside, return to the ER. ? ?You should also return for any other abnormal bleeding or bruising, difficulty breathing, difficulty swallowing, or any other new or worsening symptoms that concern you. ?

## 2021-12-23 NOTE — ED Triage Notes (Signed)
Patient arrived via EMS from home after her nose began bleeding approx 1 hour ago. On arrival, pt has pressure applied to her nose, some bleeding observed. EMS administered Afrin 2 sprays in each nostril. Pt states she was getting ready for bed when it started. Denies taking any blood thinners. ?

## 2021-12-23 NOTE — ED Notes (Signed)
ED Provider at bedside. 

## 2021-12-23 NOTE — ED Provider Notes (Signed)
? ?[facility] ?Provider Note ? ? ? Event Date/Time  ? First MD Initiated Contact with Patient 12/23/21 0350   ?  (approximate) ? ? ?History  ? ?Epistaxis ? ? ?HPI ? ?Morgan Patel is a 51 y.o. female with a history of hypertension, hyperlipidemia, diabetes, CAD, CVA, and degenerative disc disease who presents with a nosebleed, cute onset, bilateral but coming more from the right.  The patient denies any other abnormal bleeding or bruising.  She has no prior history of nosebleeds.  She states that she had been somewhat stuffy and coughing previously.  EMS administered Afrin. ? ? ? ?Physical Exam  ? ?Triage Vital Signs: ?ED Triage Vitals  ?Enc Vitals Group  ?   BP 12/23/21 0355 (!) 177/98  ?   Pulse Rate 12/23/21 0355 (!) 108  ?   Resp 12/23/21 0355 (!) 21  ?   Temp 12/23/21 0355 98.3 ?F (36.8 ?C)  ?   Temp src --   ?   SpO2 12/23/21 0353 97 %  ?   Weight 12/23/21 0356 242 lb (109.8 kg)  ?   Height 12/23/21 0356 5\' 2"  (1.575 m)  ?   Head Circumference --   ?   Peak Flow --   ?   Pain Score 12/23/21 0355 0  ?   Pain Loc --   ?   Pain Edu? --   ?   Excl. in GC? --   ? ? ?Most recent vital signs: ?Vitals:  ? 12/23/21 0530 12/23/21 0637  ?BP: (!) 177/96 (!) 172/94  ?Pulse: 86 84  ?Resp: 16 18  ?Temp:    ?SpO2: 97% 97%  ? ? ? ?General: Alert, well-appearing. ?CV:  Good peripheral perfusion.  ?Resp:  Normal effort.  ?Abd:  No distention.  ?Other:  Bilateral anterior nasal mucosa have areas with evidence of recent bleeding, with no active hemorrhage.  Normal-appearing nasal septum.  Oropharynx clear. ? ? ?ED Results / Procedures / Treatments  ? ?Labs ?(all labs ordered are listed, but only abnormal results are displayed) ?Labs Reviewed - No data to display ? ? ?EKG ? ? ? ? ?RADIOLOGY ? ? ? ?PROCEDURES: ? ?Critical Care performed: No ? ?Procedures ? ? ?MEDICATIONS ORDERED IN ED: ?Medications  ?lidocaine (XYLOCAINE) 2 % viscous mouth solution 15 mL (15 mLs Mouth/Throat Not Given 12/23/21 0504)   ? ? ? ?IMPRESSION / MDM / ASSESSMENT AND PLAN / ED COURSE  ?I reviewed the triage vital signs and the nursing notes. ? ?51 year old female with PMH as noted above presents with acute onset of nosebleed, now subsiding.  She has no other abnormal bleeding or bruising.  She is on aspirin but no anticoagulation. ? ?On exam the patient is hypertensive with otherwise normal vital signs.  She has evidence of recent bleeding in bilateral anterior nasal mucosa.  Exam is otherwise unremarkable. ? ?Overall presentation is consistent with anterior epistaxis.  There have been recent significant weather changes and the patient's mucous membranes appear somewhat dry.  I had prepared to cauterize, however the patient now has no active bleeding.  We placed a nasal clamp for approximately 10 to 15 minutes and after taking it off the patient is still not bleeding.  I will observe her for an additional hour.  If there is no further bleeding anticipate discharge home. ? ?The patient is on the cardiac monitor to evaluate for evidence of arrhythmia and/or significant heart rate changes. ? ?----------------------------------------- ?6:44 AM on 12/23/2021 ?----------------------------------------- ? ?  The patient coughed up an approximately coin sized clot of blood, however she has had no further nosebleed.  She now feels that her nasal passages are clear.  I suspect that this clot was accumulating in her posterior nasopharynx and is now cleared. ? ?On repeat examination of the nasal passages, there is no visible active epistaxis.  The patient is stable for discharge home at this time. ? ?I counseled her on the results of our evaluation, how to treat recurrent epistaxis, and ED return precautions.  I will prescribe a short course of Afrin.  The patient expressed understanding and agreement. ? ? ? ?FINAL CLINICAL IMPRESSION(S) / ED DIAGNOSES  ? ?Final diagnoses:  ?Anterior epistaxis  ? ? ? ?Rx / DC Orders  ? ?ED Discharge Orders   ? ?       Ordered  ?  oxymetazoline (AFRIN) 0.05 % nasal spray  2 times daily       ? 12/23/21 0643  ? ?  ?  ? ?  ? ? ? ?Note:  This document was prepared using Dragon voice recognition software and may include unintentional dictation errors.  ?  Dionne Bucy, MD ?12/23/21 [phone] ? ?

## 2022-02-07 ENCOUNTER — Other Ambulatory Visit: Payer: Self-pay | Admitting: Student

## 2022-02-07 DIAGNOSIS — M4316 Spondylolisthesis, lumbar region: Secondary | ICD-10-CM

## 2022-02-21 ENCOUNTER — Ambulatory Visit: Payer: Medicare Other

## 2022-02-26 ENCOUNTER — Other Ambulatory Visit: Payer: Self-pay | Admitting: Physical Medicine and Rehabilitation

## 2022-02-26 DIAGNOSIS — M4802 Spinal stenosis, cervical region: Secondary | ICD-10-CM

## 2022-03-06 ENCOUNTER — Ambulatory Visit
Admission: RE | Admit: 2022-03-06 | Discharge: 2022-03-06 | Disposition: A | Discharge status: Home / Self Care | Payer: Medicare Other | Source: Ambulatory Visit | Attending: Student | Admitting: Student

## 2022-03-06 DIAGNOSIS — M4316 Spondylolisthesis, lumbar region: Secondary | ICD-10-CM | POA: Diagnosis not present

## 2022-03-06 MED ORDER — GADOBUTROL 1 MMOL/ML IV SOLN
10.0000 mL | Freq: Once | INTRAVENOUS | Status: AC | PRN
  Administered 2022-03-06: 10 mL via INTRAVENOUS

## 2022-03-10 ENCOUNTER — Ambulatory Visit
Admission: RE | Admit: 2022-03-10 | Discharge: 2022-03-10 | Disposition: A | Discharge status: Home / Self Care | Payer: Medicare Other | Source: Ambulatory Visit | Attending: Physical Medicine and Rehabilitation | Admitting: Physical Medicine and Rehabilitation

## 2022-03-10 DIAGNOSIS — M4802 Spinal stenosis, cervical region: Secondary | ICD-10-CM

## 2022-04-25 ENCOUNTER — Other Ambulatory Visit: Payer: Self-pay | Admitting: Neurological Surgery

## 2022-05-02 NOTE — Progress Notes (Signed)
Surgical Instructions    Your procedure is scheduled on Wednesday August 16.  Report to [facility] Main Entrance "A" at 10:45 A.M., then check in with the Admitting office.  Call this number if you have problems the morning of surgery:  [phone]   If you have any questions prior to your surgery date call [phone]: Open Monday-Friday 8am-4pm    Remember:  Do not eat after midnight the night before your surgery  You may drink clear liquids until 9:45 the morning of your surgery.   Clear liquids allowed are: Water, Non-Citrus Juices (without pulp), Carbonated Beverages, Clear Tea, Black Coffee ONLY (NO MILK, CREAM OR POWDERED CREAMER of any kind), and Gatorade    Take these medicines the morning of surgery with A SIP OF WATER:  atorvastatin (LIPITOR) carvedilol (COREG) DULoxetine (CYMBALTA)  fexofenadine (ALLEGRA)  fluticasone (FLONASE) if needed hydrALAZINE (APRESOLINE)  nitroGLYCERIN (NITROSTAT)if needed oxyCODONE-acetaminophen (PERCOCET/ROXICET)if needed pregabalin (LYRICA)  As of today, STOP taking any Aspirin (unless otherwise instructed by your surgeon) Aleve, Naproxen, Ibuprofen, Motrin, Advil, Goody's, BC's, all herbal medications, fish oil, and all vitamins.  WHAT DO I DO ABOUT MY DIABETES MEDICATION?  Do not take oral diabetes medicines (pills) the morning of surgery. DO NOT TAKE metFORMIN (GLUCOPHAGE) THE MORNING OF SURGERY.      The day of surgery, do not take other diabetes injectables, including Byetta (exenatide), Bydureon (exenatide ER), Victoza (liraglutide), or Trulicity (dulaglutide).   HOW TO MANAGE YOUR DIABETES BEFORE AND AFTER SURGERY  Why is it important to control my blood sugar before and after surgery? Improving blood sugar levels before and after surgery helps healing and can limit problems. A way of improving blood sugar control is eating a healthy diet by:  Eating less sugar and carbohydrates  Increasing activity/exercise  Talking with  your doctor about reaching your blood sugar goals High blood sugars (greater than 180 mg/dL) can raise your risk of infections and slow your recovery, so you will need to focus on controlling your diabetes during the weeks before surgery. Make sure that the doctor who takes care of your diabetes knows about your planned surgery including the date and location.  How do I manage my blood sugar before surgery? Check your blood sugar at least 4 times a day, starting 2 days before surgery, to make sure that the level is not too high or low.  Check your blood sugar the morning of your surgery when you wake up and every 2 hours until you get to the Short Stay unit.  If your blood sugar is less than 70 mg/dL, you will need to treat for low blood sugar: Do not take insulin. Treat a low blood sugar (less than 70 mg/dL) with  cup of clear juice (cranberry or apple), 4 glucose tablets, OR glucose gel. Recheck blood sugar in 15 minutes after treatment (to make sure it is greater than 70 mg/dL). If your blood sugar is not greater than 70 mg/dL on recheck, call [phone] for further instructions. Report your blood sugar to the short stay nurse when you get to Short Stay.  If you are admitted to the hospital after surgery: Your blood sugar will be checked by the staff and you will probably be given insulin after surgery (instead of oral diabetes medicines) to make sure you have good blood sugar levels. The goal for blood sugar control after surgery is 80-180 mg/dL.          Do not wear jewelry or makeup. Do not  wear lotions, powders, perfumes/cologne or deodorant. Do not shave 48 hours prior to surgery.  Men may shave face and neck. Do not bring valuables to the hospital. Do not wear nail polish, gel polish, artificial nails, or any other type of covering on natural nails (fingers and toes) If you have artificial nails or gel coating that need to be removed by a nail salon, please have this removed prior  to surgery. Artificial nails or gel coating may interfere with anesthesia's ability to adequately monitor your vital signs.  [facility] is not responsible for any belongings or valuables.    Do NOT Smoke (Tobacco/Vaping)  24 hours prior to your procedure  If you use a CPAP at night, you may bring your mask for your overnight stay.   Contacts, glasses, hearing aids, dentures or partials may not be worn into surgery, please bring cases for these belongings   For patients admitted to the hospital, discharge time will be determined by your treatment team.   Patients discharged the day of surgery will not be allowed to drive home, and someone needs to stay with them for 24 hours.   SURGICAL WAITING ROOM VISITATION Patients having surgery or a procedure may have no more than 2 support people in the waiting area - these visitors may rotate.   Children under the age of 46 must have an adult with them who is not the patient. If the patient needs to stay at the hospital during part of their recovery, the visitor guidelines for inpatient rooms apply. Pre-op nurse will coordinate an appropriate time for 1 support person to accompany patient in pre-op.  This support person may not rotate.   Please refer to the [facility] website for the visitor guidelines for Inpatients (after your surgery is over and you are in a regular room).    Special instructions:    Oral Hygiene is also important to reduce your risk of infection.  Remember - BRUSH YOUR TEETH THE MORNING OF SURGERY WITH YOUR REGULAR TOOTHPASTE   [facility]- Preparing For Surgery  Before surgery, you can play an important role. Because skin is not sterile, your skin needs to be as free of germs as possible. You can reduce the number of germs on your skin by washing with CHG (chlorahexidine gluconate) Soap before surgery.  CHG is an antiseptic cleaner which kills germs and bonds with the skin to continue killing germs even after washing.      Please do not use if you have an allergy to CHG or antibacterial soaps. If your skin becomes reddened/irritated stop using the CHG.  Do not shave (including legs and underarms) for at least 48 hours prior to first CHG shower. It is OK to shave your face.  Please follow these instructions carefully.     Shower the NIGHT BEFORE SURGERY and the MORNING OF SURGERY with CHG Soap.   If you chose to wash your hair, wash your hair first as usual with your normal shampoo. After you shampoo, rinse your hair and body thoroughly to remove the shampoo.  Then [employer] and genitals (private parts) with your normal soap and rinse thoroughly to remove soap.  After that Use CHG Soap as you would any other liquid soap. You can apply CHG directly to the skin and wash gently with a scrungie or a clean washcloth.   Apply the CHG Soap to your body ONLY FROM THE NECK DOWN.  Do not use on open wounds or open sores. Avoid  contact with your eyes, ears, mouth and genitals (private parts). Wash Face and genitals (private parts)  with your normal soap.   Wash thoroughly, paying special attention to the area where your surgery will be performed.  Thoroughly rinse your body with warm water from the neck down.  DO NOT shower/wash with your normal soap after using and rinsing off the CHG Soap.  Pat yourself dry with a CLEAN TOWEL.  Wear CLEAN PAJAMAS to bed the night before surgery  Place CLEAN SHEETS on your bed the night before your surgery  DO NOT SLEEP WITH PETS.   Day of Surgery:  Take a shower with CHG soap. Wear Clean/Comfortable clothing the morning of surgery Do not apply any deodorants/lotions.   Remember to brush your teeth WITH YOUR REGULAR TOOTHPASTE.    If you received a COVID test during your pre-op visit, it is requested that you wear a mask when out in public, stay away from anyone that may not be feeling well, and notify your surgeon if you develop symptoms. If you have been in contact  with anyone that has tested positive in the last 10 days, please notify your surgeon.    Please read over the following fact sheets that you were given.

## 2022-05-05 ENCOUNTER — Encounter (HOSPITAL_COMMUNITY)
Admission: RE | Admit: 2022-05-05 | Discharge: 2022-05-05 | Disposition: A | Discharge status: Home / Self Care | Payer: Medicare Other | Source: Ambulatory Visit | Attending: Neurological Surgery | Admitting: Neurological Surgery

## 2022-05-05 ENCOUNTER — Other Ambulatory Visit: Payer: Self-pay

## 2022-05-05 ENCOUNTER — Encounter (HOSPITAL_COMMUNITY): Payer: Self-pay

## 2022-05-05 VITALS — HR 103 | Temp 98.2°F | Resp 18 | Ht 62.0 in | Wt 245.9 lb

## 2022-05-05 DIAGNOSIS — Z01818 Encounter for other preprocedural examination: Secondary | ICD-10-CM | POA: Diagnosis present

## 2022-05-05 HISTORY — DX: Allergy, unspecified, initial encounter: T78.40XA

## 2022-05-05 HISTORY — DX: Dyspnea, unspecified: R06.00

## 2022-05-05 LAB — GLUCOSE, CAPILLARY: Glucose-Capillary: 227 mg/dL — ABNORMAL HIGH (ref 70–99)

## 2022-05-05 LAB — CBC
HCT: 42.5 % (ref 36.0–46.0)
Hemoglobin: 14 g/dL (ref 12.0–15.0)
MCH: 30.2 pg (ref 26.0–34.0)
MCHC: 32.9 g/dL (ref 30.0–36.0)
MCV: 91.6 fL (ref 80.0–100.0)
Platelets: 354 10*3/uL (ref 150–400)
RBC: 4.64 MIL/uL (ref 3.87–5.11)
RDW: 13.1 % (ref 11.5–15.5)
WBC: 11.2 10*3/uL — ABNORMAL HIGH (ref 4.0–10.5)
nRBC: 0 % (ref 0.0–0.2)

## 2022-05-05 LAB — TYPE AND SCREEN
ABO/RH(D): O POS
Antibody Screen: NEGATIVE

## 2022-05-05 LAB — BASIC METABOLIC PANEL
Anion gap: 8 (ref 5–15)
BUN: 7 mg/dL (ref 6–20)
CO2: 25 mmol/L (ref 22–32)
Calcium: 9.7 mg/dL (ref 8.9–10.3)
Chloride: 104 mmol/L (ref 98–111)
Creatinine, Ser: 0.69 mg/dL (ref 0.44–1.00)
GFR, Estimated: >60 mL/min (ref >60)
Glucose, Bld: 185 mg/dL — ABNORMAL HIGH (ref 70–99)
Potassium: 3.6 mmol/L (ref 3.5–5.1)
Sodium: 137 mmol/L (ref 135–145)

## 2022-05-05 LAB — SURGICAL PCR SCREEN
MRSA, PCR: NEGATIVE
Staphylococcus aureus: NEGATIVE

## 2022-05-05 LAB — PROTIME-INR
INR: 1 (ref 0.8–1.2)
Prothrombin Time: 12.8 seconds (ref 11.4–15.2)

## 2022-05-05 NOTE — Progress Notes (Addendum)
Surgical Instructions   Your procedure is scheduled on Wednesday, 05/07/22 at 12:45 PM.  Report to [facility] Main Entrance "A" at 1045 AM, then check in with the Admitting office.  Call this number if you have problems the morning of surgery: [phone]    If you have any questions prior to your surgery date call [phone]: Open Monday-Friday 8am-4pm   Remember:  Do not eat or drink after midnight the night before your surgery.    Take these medicines the morning of surgery with A SIP OF WATER:  atorvastatin (LIPITOR) carvedilol (COREG) DULoxetine (CYMBALTA)  fexofenadine (ALLEGRA)  Hydralazine (Apresoline) pregabalin (LYRICA) Verapamil (Calan-SR)  fluticasone (FLONASE) if needed nitroGLYCERIN (NITROSTAT)if needed oxyCODONE-acetaminophen (PERCOCET/ROXICET)if needed  As of today, STOP taking any Aspirin (unless otherwise instructed by your surgeon) Aleve, Naproxen, Ibuprofen, Motrin, Advil, Goody's, BC's, all herbal medications, fish oil, and all vitamins.  WHAT DO I DO ABOUT MY DIABETES MEDICATION?  Do not take oral diabetes medicines (pills) the morning of surgery. DO NOT TAKE metFORMIN (GLUCOPHAGE) THE MORNING OF SURGERY.      The day of surgery, do not take other diabetes injectables, including Byetta (exenatide), Bydureon (exenatide ER), Victoza (liraglutide), or Trulicity (dulaglutide).  HOW TO MANAGE YOUR DIABETES BEFORE AND AFTER SURGERY  Why is it important to control my blood sugar before and after surgery? Improving blood sugar levels before and after surgery helps healing and can limit problems. A way of improving blood sugar control is eating a healthy diet by:  Eating less sugar and carbohydrates  Increasing activity/exercise  Talking with your doctor about reaching your blood sugar goals High blood sugars (greater than 180 mg/dL) can raise your risk of infections and slow your recovery, so you will need to focus on controlling your diabetes during the weeks  before surgery. Make sure that the doctor who takes care of your diabetes knows about your planned surgery including the date and location.  How do I manage my blood sugar before surgery? Check your blood sugar at least 4 times a day, starting 2 days before surgery, to make sure that the level is not too high or low.  Check your blood sugar the morning of your surgery when you wake up and every 2 hours until you get to the Short Stay unit.  If your blood sugar is less than 70 mg/dL, you will need to treat for low blood sugar: Do not take insulin. Treat a low blood sugar (less than 70 mg/dL) with  cup of clear juice (cranberry or apple), 4 glucose tablets, OR glucose gel. Recheck blood sugar in 15 minutes after treatment (to make sure it is greater than 70 mg/dL). If your blood sugar is not greater than 70 mg/dL on recheck, call [phone] for further instructions. Report your blood sugar to the short stay nurse when you get to Short Stay.  If you are admitted to the hospital after surgery: Your blood sugar will be checked by the staff and you will probably be given insulin after surgery (instead of oral diabetes medicines) to make sure you have good blood sugar levels. The goal for blood sugar control after surgery is 80-180 mg/dL.          Do not wear jewelry or makeup. Do not wear lotions, powders, perfumes or deodorant. Do not shave 48 hours prior to surgery.   Do not bring valuables to the hospital. Do not wear nail polish, gel polish, artificial nails, or any other type of covering on  natural nails (fingers and toes) If you have artificial nails or gel coating that need to be removed by a nail salon, please have this removed prior to surgery. Artificial nails or gel coating may interfere with anesthesia's ability to adequately monitor your vital signs.  [facility] is not responsible for any belongings or valuables.    Do NOT Smoke (Tobacco/Vaping)  24 hours prior to your  procedure  If you use a CPAP at night, you may bring your mask for your overnight stay.   Contacts, glasses, hearing aids, dentures or partials may not be worn into surgery, please bring cases for these belongings   For patients admitted to the hospital, discharge time will be determined by your treatment team.   Patients discharged the day of surgery will not be allowed to drive home, and someone needs to stay with them for 24 hours.   SURGICAL WAITING ROOM VISITATION Patients having surgery or a procedure may have no more than 2 support people in the waiting area - these visitors may rotate.   Children under the age of 47 must have an adult with them who is not the patient. If the patient needs to stay at the hospital during part of their recovery, the visitor guidelines for inpatient rooms apply. Pre-op nurse will coordinate an appropriate time for 1 support person to accompany patient in pre-op.  This support person may not rotate.   Please refer to the [facility] website for the visitor guidelines for Inpatients (after your surgery is over and you are in a regular room).   Special instructions:    Oral Hygiene is also important to reduce your risk of infection.  Remember - BRUSH YOUR TEETH THE MORNING OF SURGERY WITH YOUR REGULAR TOOTHPASTE  [place]- Preparing For Surgery  Before surgery, you can play an important role. Because skin is not sterile, your skin needs to be as free of germs as possible. You can reduce the number of germs on your skin by washing with CHG (chlorahexidine gluconate) Soap before surgery.  CHG is an antiseptic cleaner which kills germs and bonds with the skin to continue killing germs even after washing.    Please do not use if you have an allergy to CHG or antibacterial soaps. If your skin becomes reddened/irritated stop using the CHG.  Do not shave (including legs and underarms) for at least 48 hours prior to first CHG shower. It is OK to shave your  face.  Please follow these instructions carefully.    Shower the NIGHT BEFORE SURGERY and the MORNING OF SURGERY with CHG Soap.   If you chose to wash your hair, wash your hair first as usual with your normal shampoo. After you shampoo, rinse your hair and body thoroughly to remove the shampoo. Then wash face and genitals (private parts) with your normal soap and rinse thoroughly to remove soap.  After that Use CHG Soap as you would any other liquid soap. You can apply CHG directly to the skin and wash gently with a scrungie or a clean washcloth.   Apply the CHG Soap to your body ONLY FROM THE NECK DOWN.  Do not use on open wounds or open sores. Avoid contact with your eyes, ears, mouth and genitals (private parts). Wash Face and genitals (private parts)  with your normal soap.   Wash thoroughly, paying special attention to the area where your surgery will be performed.  Thoroughly rinse your body with warm water from the neck down.  DO NOT shower/wash with your normal soap after using and rinsing off the CHG Soap.  Pat yourself dry with a CLEAN TOWEL.  Wear CLEAN PAJAMAS to bed the night before surgery  Place CLEAN SHEETS on your bed the night before your surgery  DO NOT SLEEP WITH PETS.  Day of Surgery: Take a shower with CHG soap. Wear Clean/Comfortable clothing the morning of surgery Do not apply any deodorants/lotions.   Remember to brush your teeth WITH YOUR REGULAR TOOTHPASTE.  If you received a COVID test during your pre-op visit, it is requested that you wear a mask when out in public, stay away from anyone that may not be feeling well, and notify your surgeon if you develop symptoms. If you have been in contact with anyone that has tested positive in the last 10 days, please notify your surgeon.    Please read over the fact sheets that you were given.

## 2022-05-05 NOTE — Progress Notes (Addendum)
PCP - Dr. Jacquelynn Cree  EKG - today Stress Test - 03/20/16 (CE) ECHO - 04/26/20 (CE) Cardiac Cath - 2015   Fasting Blood Sugar - 227 Checks Blood Sugar ___not daily__  A1C - 7.1 on  04/07/22  Aspirin Instructions: last dose 04/30/22 -instructed by Dr. Yetta Barre to hold for 7 days   Anesthesia review: No  Patient denies shortness of breath, fever, cough and chest pain at PAT appointment   All instructions explained to the patient, with a verbal understanding of the material. Patient agrees to go over the instructions while at home for a better understanding. Patient also instructed to self quarantine after being tested for COVID-19. The opportunity to ask questions was provided.

## 2022-05-07 ENCOUNTER — Other Ambulatory Visit: Payer: Self-pay

## 2022-05-07 ENCOUNTER — Ambulatory Visit (HOSPITAL_COMMUNITY): Payer: Medicare Other

## 2022-05-07 ENCOUNTER — Ambulatory Visit (HOSPITAL_BASED_OUTPATIENT_CLINIC_OR_DEPARTMENT_OTHER): Payer: Medicare Other | Admitting: Certified Registered Nurse Anesthetist

## 2022-05-07 ENCOUNTER — Ambulatory Visit (HOSPITAL_COMMUNITY): Payer: Medicare Other | Admitting: Certified Registered Nurse Anesthetist

## 2022-05-07 ENCOUNTER — Observation Stay (HOSPITAL_COMMUNITY)
Admission: RE | Admit: 2022-05-07 | Discharge: 2022-05-08 | Disposition: A | Discharge status: Home / Self Care | Payer: Medicare Other | Attending: Neurological Surgery | Admitting: Neurological Surgery

## 2022-05-07 ENCOUNTER — Ambulatory Visit (HOSPITAL_COMMUNITY)
Admission: RE | Disposition: A | Discharge status: Home / Self Care | Payer: Self-pay | Source: Home / Self Care | Attending: Neurological Surgery

## 2022-05-07 ENCOUNTER — Encounter (HOSPITAL_COMMUNITY): Payer: Self-pay | Admitting: Neurological Surgery

## 2022-05-07 DIAGNOSIS — F17200 Nicotine dependence, unspecified, uncomplicated: Secondary | ICD-10-CM

## 2022-05-07 DIAGNOSIS — Z7984 Long term (current) use of oral hypoglycemic drugs: Secondary | ICD-10-CM

## 2022-05-07 DIAGNOSIS — Z981 Arthrodesis status: Secondary | ICD-10-CM

## 2022-05-07 DIAGNOSIS — M4316 Spondylolisthesis, lumbar region: Secondary | ICD-10-CM | POA: Diagnosis not present

## 2022-05-07 DIAGNOSIS — M48061 Spinal stenosis, lumbar region without neurogenic claudication: Secondary | ICD-10-CM | POA: Diagnosis present

## 2022-05-07 DIAGNOSIS — Z8673 Personal history of transient ischemic attack (TIA), and cerebral infarction without residual deficits: Secondary | ICD-10-CM | POA: Diagnosis not present

## 2022-05-07 DIAGNOSIS — I10 Essential (primary) hypertension: Secondary | ICD-10-CM | POA: Diagnosis not present

## 2022-05-07 DIAGNOSIS — E119 Type 2 diabetes mellitus without complications: Secondary | ICD-10-CM | POA: Insufficient documentation

## 2022-05-07 DIAGNOSIS — Z7982 Long term (current) use of aspirin: Secondary | ICD-10-CM | POA: Insufficient documentation

## 2022-05-07 DIAGNOSIS — F17210 Nicotine dependence, cigarettes, uncomplicated: Secondary | ICD-10-CM | POA: Diagnosis not present

## 2022-05-07 DIAGNOSIS — I2510 Atherosclerotic heart disease of native coronary artery without angina pectoris: Secondary | ICD-10-CM

## 2022-05-07 DIAGNOSIS — Z79899 Other long term (current) drug therapy: Secondary | ICD-10-CM | POA: Diagnosis not present

## 2022-05-07 LAB — GLUCOSE, CAPILLARY
Glucose-Capillary: 201 mg/dL — ABNORMAL HIGH (ref 70–99)
Glucose-Capillary: 130 mg/dL — ABNORMAL HIGH (ref 70–99)
Glucose-Capillary: 160 mg/dL — ABNORMAL HIGH (ref 70–99)
Glucose-Capillary: 161 mg/dL — ABNORMAL HIGH (ref 70–99)
Glucose-Capillary: 311 mg/dL — ABNORMAL HIGH (ref 70–99)
Glucose-Capillary: 354 mg/dL — ABNORMAL HIGH (ref 70–99)

## 2022-05-07 LAB — HEMOGLOBIN A1C
Hgb A1c MFr Bld: 7.6 % — ABNORMAL HIGH (ref 4.8–5.6)
Mean Plasma Glucose: 171.42 mg/dL

## 2022-05-07 LAB — ABO/RH: ABO/RH(D): O POS

## 2022-05-07 SURGERY — POSTERIOR LUMBAR FUSION 2 LEVEL
Anesthesia: General | Site: Spine Lumbar

## 2022-05-07 MED ORDER — VANCOMYCIN HCL IN DEXTROSE 1-5 GM/200ML-% IV SOLN: 1000.0000 mg | INTRAVENOUS | Status: DC

## 2022-05-07 MED ORDER — ONDANSETRON HCL 4 MG/2ML IJ SOLN
INTRAMUSCULAR | Status: DC | PRN
  Administered 2022-05-07: 4 mg via INTRAVENOUS

## 2022-05-07 MED ORDER — HYDROMORPHONE HCL 1 MG/ML IJ SOLN
0.2500 mg | INTRAMUSCULAR | Status: DC | PRN
  Administered 2022-05-07 (×2): 0.5 mg via INTRAVENOUS

## 2022-05-07 MED ORDER — ACETAMINOPHEN 500 MG PO TABS: 1000.0000 mg | ORAL_TABLET | ORAL | Status: AC

## 2022-05-07 MED ORDER — POTASSIUM CHLORIDE IN NACL 20-0.9 MEQ/L-% IV SOLN: INTRAVENOUS | Status: DC

## 2022-05-07 MED ORDER — KETAMINE HCL 10 MG/ML IJ SOLN
INTRAMUSCULAR | Status: DC | PRN
  Administered 2022-05-07: 20 mg via INTRAVENOUS
  Administered 2022-05-07 (×3): 10 mg via INTRAVENOUS

## 2022-05-07 MED ORDER — FENTANYL CITRATE (PF) 250 MCG/5ML IJ SOLN
INTRAMUSCULAR | Status: DC | PRN
  Administered 2022-05-07: 50 ug via INTRAVENOUS
  Administered 2022-05-07: 150 ug via INTRAVENOUS
  Administered 2022-05-07: 50 ug via INTRAVENOUS

## 2022-05-07 MED ORDER — THROMBIN 5000 UNITS EX SOLR
CUTANEOUS | Status: AC
Start: 2022-05-07 — End: ?
  Filled 2022-05-07: qty 5000

## 2022-05-07 MED ORDER — ROCURONIUM BROMIDE 10 MG/ML (PF) SYRINGE
PREFILLED_SYRINGE | INTRAVENOUS | Status: AC
  Filled 2022-05-07: qty 10

## 2022-05-07 MED ORDER — PHENYLEPHRINE 80 MCG/ML (10ML) SYRINGE FOR IV PUSH (FOR BLOOD PRESSURE SUPPORT)
PREFILLED_SYRINGE | INTRAVENOUS | Status: AC
  Filled 2022-05-07: qty 20

## 2022-05-07 MED ORDER — MIDAZOLAM HCL 2 MG/2ML IJ SOLN
INTRAMUSCULAR | Status: AC
  Filled 2022-05-07: qty 2

## 2022-05-07 MED ORDER — DEXAMETHASONE SODIUM PHOSPHATE 4 MG/ML IJ SOLN
4.0000 mg | Freq: Four times a day (QID) | INTRAMUSCULAR | Status: DC
  Administered 2022-05-07 – 2022-05-08 (×3): 4 mg via INTRAVENOUS
  Filled 2022-05-07 (×3): qty 1

## 2022-05-07 MED ORDER — THROMBIN 5000 UNITS EX SOLR: OROMUCOSAL | Status: DC | PRN

## 2022-05-07 MED ORDER — THROMBIN 20000 UNITS EX SOLR
CUTANEOUS | Status: AC
  Filled 2022-05-07: qty 20000

## 2022-05-07 MED ORDER — VANCOMYCIN HCL 1000 MG IV SOLR
INTRAVENOUS | Status: DC | PRN
  Administered 2022-05-07: 1000 mg via TOPICAL

## 2022-05-07 MED ORDER — ACETAMINOPHEN 10 MG/ML IV SOLN
INTRAVENOUS | Status: DC | PRN
  Administered 2022-05-07: 1000 mg via INTRAVENOUS

## 2022-05-07 MED ORDER — METHOCARBAMOL 1000 MG/10ML IJ SOLN: 500.0000 mg | Freq: Four times a day (QID) | INTRAVENOUS | Status: DC | PRN

## 2022-05-07 MED ORDER — ONDANSETRON HCL 4 MG PO TABS: 4.0000 mg | ORAL_TABLET | Freq: Four times a day (QID) | ORAL | Status: DC | PRN

## 2022-05-07 MED ORDER — MIDAZOLAM HCL 2 MG/2ML IJ SOLN
INTRAMUSCULAR | Status: DC | PRN
  Administered 2022-05-07 (×2): 1 mg via INTRAVENOUS

## 2022-05-07 MED ORDER — MORPHINE SULFATE (PF) 2 MG/ML IV SOLN: 2.0000 mg | INTRAVENOUS | Status: DC | PRN

## 2022-05-07 MED ORDER — INSULIN ASPART 100 UNIT/ML IJ SOLN: 0.0000 [IU] | Freq: Three times a day (TID) | INTRAMUSCULAR | Status: DC

## 2022-05-07 MED ORDER — 0.9 % SODIUM CHLORIDE (POUR BTL) OPTIME
TOPICAL | Status: DC | PRN
  Administered 2022-05-07: 1000 mL

## 2022-05-07 MED ORDER — ASPIRIN 81 MG PO CHEW
81.0000 mg | CHEWABLE_TABLET | Freq: Every day | ORAL | Status: DC
  Filled 2022-05-07: qty 1

## 2022-05-07 MED ORDER — FLUTICASONE PROPIONATE 50 MCG/ACT NA SUSP: 1.0000 | Freq: Every day | NASAL | Status: DC | PRN

## 2022-05-07 MED ORDER — CARVEDILOL 3.125 MG PO TABS
ORAL_TABLET | ORAL | Status: AC
  Administered 2022-05-07: 3.125 mg via ORAL
  Filled 2022-05-07: qty 1

## 2022-05-07 MED ORDER — ROCURONIUM BROMIDE 10 MG/ML (PF) SYRINGE
PREFILLED_SYRINGE | INTRAVENOUS | Status: DC | PRN
  Administered 2022-05-07: 20 mg via INTRAVENOUS
  Administered 2022-05-07: 60 mg via INTRAVENOUS
  Administered 2022-05-07 (×3): 20 mg via INTRAVENOUS

## 2022-05-07 MED ORDER — HYDRALAZINE HCL 50 MG PO TABS
100.0000 mg | ORAL_TABLET | Freq: Three times a day (TID) | ORAL | Status: DC
  Administered 2022-05-07: 100 mg via ORAL
  Filled 2022-05-07 (×2): qty 2

## 2022-05-07 MED ORDER — PROPOFOL 10 MG/ML IV BOLUS
INTRAVENOUS | Status: AC
  Filled 2022-05-07: qty 20

## 2022-05-07 MED ORDER — SODIUM CHLORIDE 0.9% FLUSH: 3.0000 mL | Freq: Two times a day (BID) | INTRAVENOUS | Status: DC

## 2022-05-07 MED ORDER — SODIUM CHLORIDE 0.9% FLUSH: 3.0000 mL | INTRAVENOUS | Status: DC | PRN

## 2022-05-07 MED ORDER — VITAMIN B-12 1000 MCG PO TABS
500.0000 ug | ORAL_TABLET | Freq: Every day | ORAL | Status: DC
Start: 2022-05-07 — End: 2022-05-08

## 2022-05-07 MED ORDER — PHENYLEPHRINE HCL-NACL 20-0.9 MG/250ML-% IV SOLN
INTRAVENOUS | Status: DC | PRN
  Administered 2022-05-07: 25 ug/min via INTRAVENOUS

## 2022-05-07 MED ORDER — PHENOL 1.4 % MT LIQD: 1.0000 | OROMUCOSAL | Status: DC | PRN

## 2022-05-07 MED ORDER — PREGABALIN 75 MG PO CAPS
150.0000 mg | ORAL_CAPSULE | Freq: Two times a day (BID) | ORAL | Status: DC
  Administered 2022-05-07: 150 mg via ORAL
  Filled 2022-05-07: qty 2

## 2022-05-07 MED ORDER — LIDOCAINE 2% (20 MG/ML) 5 ML SYRINGE
INTRAMUSCULAR | Status: AC
  Filled 2022-05-07: qty 5

## 2022-05-07 MED ORDER — ONDANSETRON HCL 4 MG/2ML IJ SOLN
4.0000 mg | Freq: Four times a day (QID) | INTRAMUSCULAR | Status: DC | PRN
Start: 2022-05-07 — End: 2022-05-08

## 2022-05-07 MED ORDER — METHOCARBAMOL 500 MG PO TABS
500.0000 mg | ORAL_TABLET | Freq: Four times a day (QID) | ORAL | Status: DC | PRN
  Administered 2022-05-07 – 2022-05-08 (×2): 500 mg via ORAL
  Filled 2022-05-07 (×2): qty 1

## 2022-05-07 MED ORDER — BUPIVACAINE HCL (PF) 0.25 % IJ SOLN
INTRAMUSCULAR | Status: DC | PRN
  Administered 2022-05-07: 10 mL
  Administered 2022-05-07: 8 mL

## 2022-05-07 MED ORDER — VANCOMYCIN HCL 1500 MG/300ML IV SOLN
1500.0000 mg | Freq: Once | INTRAVENOUS | Status: AC
Start: 2022-05-07 — End: 2022-05-08
  Administered 2022-05-07: 1500 mg via INTRAVENOUS
  Filled 2022-05-07: qty 300

## 2022-05-07 MED ORDER — SODIUM CHLORIDE 0.9 % IV SOLN: 250.0000 mL | INTRAVENOUS | Status: DC

## 2022-05-07 MED ORDER — ACETAMINOPHEN 10 MG/ML IV SOLN
INTRAVENOUS | Status: AC
Start: 2022-05-07 — End: ?
  Filled 2022-05-07: qty 100

## 2022-05-07 MED ORDER — DEXAMETHASONE SODIUM PHOSPHATE 10 MG/ML IJ SOLN
INTRAMUSCULAR | Status: AC
  Filled 2022-05-07: qty 1

## 2022-05-07 MED ORDER — INSULIN ASPART 100 UNIT/ML IJ SOLN
0.0000 [IU] | Freq: Every day | INTRAMUSCULAR | Status: DC
  Administered 2022-05-07 – 2022-05-08 (×2): 5 [IU] via SUBCUTANEOUS

## 2022-05-07 MED ORDER — KETAMINE HCL 50 MG/5ML IJ SOSY
PREFILLED_SYRINGE | INTRAMUSCULAR | Status: AC
  Filled 2022-05-07: qty 5

## 2022-05-07 MED ORDER — ORAL CARE MOUTH RINSE: 15.0000 mL | Freq: Once | OROMUCOSAL | Status: AC

## 2022-05-07 MED ORDER — METFORMIN HCL 500 MG PO TABS
1000.0000 mg | ORAL_TABLET | Freq: Two times a day (BID) | ORAL | Status: DC
  Administered 2022-05-08: 1000 mg via ORAL
  Filled 2022-05-07: qty 2

## 2022-05-07 MED ORDER — VANCOMYCIN HCL 1000 MG IV SOLR
INTRAVENOUS | Status: AC
  Filled 2022-05-07: qty 20

## 2022-05-07 MED ORDER — ATORVASTATIN CALCIUM 80 MG PO TABS
80.0000 mg | ORAL_TABLET | Freq: Every day | ORAL | Status: DC
  Administered 2022-05-07: 80 mg via ORAL
  Filled 2022-05-07: qty 1

## 2022-05-07 MED ORDER — CHLORHEXIDINE GLUCONATE 0.12 % MT SOLN
15.0000 mL | Freq: Once | OROMUCOSAL | Status: AC
  Administered 2022-05-07: 15 mL via OROMUCOSAL
  Filled 2022-05-07: qty 15

## 2022-05-07 MED ORDER — OXYCODONE HCL 5 MG PO TABS: 5.0000 mg | ORAL_TABLET | Freq: Once | ORAL | Status: DC | PRN

## 2022-05-07 MED ORDER — INSULIN ASPART 100 UNIT/ML IJ SOLN
0.0000 [IU] | INTRAMUSCULAR | Status: AC | PRN
  Administered 2022-05-07: 2 [IU] via SUBCUTANEOUS
  Administered 2022-05-07: 4 [IU] via SUBCUTANEOUS
  Filled 2022-05-07: qty 1

## 2022-05-07 MED ORDER — HYDROMORPHONE HCL 1 MG/ML IJ SOLN
INTRAMUSCULAR | Status: AC
  Administered 2022-05-07: 0.5 mg
  Filled 2022-05-07: qty 1

## 2022-05-07 MED ORDER — PHENYLEPHRINE 80 MCG/ML (10ML) SYRINGE FOR IV PUSH (FOR BLOOD PRESSURE SUPPORT)
PREFILLED_SYRINGE | INTRAVENOUS | Status: DC | PRN
  Administered 2022-05-07: 80 ug via INTRAVENOUS

## 2022-05-07 MED ORDER — CARVEDILOL 3.125 MG PO TABS
3.1250 mg | ORAL_TABLET | Freq: Once | ORAL | Status: AC
Start: 2022-05-07 — End: 2022-05-07

## 2022-05-07 MED ORDER — KETOROLAC TROMETHAMINE 15 MG/ML IJ SOLN
INTRAMUSCULAR | Status: AC
  Filled 2022-05-07: qty 1

## 2022-05-07 MED ORDER — CHLORHEXIDINE GLUCONATE CLOTH 2 % EX PADS: 6.0000 | MEDICATED_PAD | Freq: Once | CUTANEOUS | Status: DC

## 2022-05-07 MED ORDER — GABAPENTIN 300 MG PO CAPS
300.0000 mg | ORAL_CAPSULE | ORAL | Status: AC
  Administered 2022-05-07: 300 mg via ORAL
  Filled 2022-05-07: qty 1

## 2022-05-07 MED ORDER — VERAPAMIL HCL ER 240 MG PO TBCR
240.0000 mg | EXTENDED_RELEASE_TABLET | Freq: Two times a day (BID) | ORAL | Status: DC
  Administered 2022-05-07: 240 mg via ORAL
  Filled 2022-05-07 (×2): qty 1

## 2022-05-07 MED ORDER — KETOROLAC TROMETHAMINE 15 MG/ML IJ SOLN
15.0000 mg | Freq: Four times a day (QID) | INTRAMUSCULAR | Status: DC
  Administered 2022-05-07 – 2022-05-08 (×3): 15 mg via INTRAVENOUS
  Filled 2022-05-07 (×2): qty 1

## 2022-05-07 MED ORDER — LACTATED RINGERS IV SOLN: INTRAVENOUS | Status: DC

## 2022-05-07 MED ORDER — PROPOFOL 10 MG/ML IV BOLUS
INTRAVENOUS | Status: DC | PRN
  Administered 2022-05-07: 150 mg via INTRAVENOUS
  Administered 2022-05-07: 50 mg via INTRAVENOUS

## 2022-05-07 MED ORDER — OXYCODONE HCL 5 MG PO TABS
10.0000 mg | ORAL_TABLET | ORAL | Status: DC | PRN
  Administered 2022-05-07 – 2022-05-08 (×3): 10 mg via ORAL
  Filled 2022-05-07 (×3): qty 2

## 2022-05-07 MED ORDER — LIDOCAINE 2% (20 MG/ML) 5 ML SYRINGE
INTRAMUSCULAR | Status: DC | PRN
  Administered 2022-05-07: 60 mg via INTRAVENOUS

## 2022-05-07 MED ORDER — ACETAMINOPHEN 500 MG PO TABS
ORAL_TABLET | ORAL | Status: AC
  Administered 2022-05-07: 1000 mg via ORAL
  Filled 2022-05-07: qty 2

## 2022-05-07 MED ORDER — AMISULPRIDE (ANTIEMETIC) 5 MG/2ML IV SOLN: 10.0000 mg | Freq: Once | INTRAVENOUS | Status: DC | PRN

## 2022-05-07 MED ORDER — VANCOMYCIN HCL 1500 MG/300ML IV SOLN
1500.0000 mg | INTRAVENOUS | Status: AC
  Administered 2022-05-07: 1500 mg via INTRAVENOUS
  Filled 2022-05-07: qty 300

## 2022-05-07 MED ORDER — DULOXETINE HCL 30 MG PO CPEP
60.0000 mg | ORAL_CAPSULE | Freq: Two times a day (BID) | ORAL | Status: DC
  Administered 2022-05-07: 60 mg via ORAL
  Filled 2022-05-07: qty 2

## 2022-05-07 MED ORDER — FENTANYL CITRATE (PF) 250 MCG/5ML IJ SOLN
INTRAMUSCULAR | Status: AC
  Filled 2022-05-07: qty 5

## 2022-05-07 MED ORDER — HYDROMORPHONE HCL 1 MG/ML IJ SOLN
INTRAMUSCULAR | Status: AC
  Filled 2022-05-07: qty 0.5

## 2022-05-07 MED ORDER — SENNA 8.6 MG PO TABS
1.0000 | ORAL_TABLET | Freq: Two times a day (BID) | ORAL | Status: DC
  Administered 2022-05-07: 8.6 mg via ORAL
  Filled 2022-05-07: qty 1

## 2022-05-07 MED ORDER — THROMBIN 20000 UNITS EX SOLR: CUTANEOUS | Status: DC | PRN

## 2022-05-07 MED ORDER — OXYCODONE HCL 5 MG/5ML PO SOLN: 5.0000 mg | Freq: Once | ORAL | Status: DC | PRN

## 2022-05-07 MED ORDER — DEXAMETHASONE SODIUM PHOSPHATE 10 MG/ML IJ SOLN
INTRAMUSCULAR | Status: DC | PRN
  Administered 2022-05-07: 4 mg via INTRAVENOUS

## 2022-05-07 MED ORDER — SUGAMMADEX SODIUM 200 MG/2ML IV SOLN
INTRAVENOUS | Status: DC | PRN
  Administered 2022-05-07: 400 mg via INTRAVENOUS

## 2022-05-07 MED ORDER — CARVEDILOL 3.125 MG PO TABS
3.1250 mg | ORAL_TABLET | Freq: Two times a day (BID) | ORAL | Status: DC
  Administered 2022-05-07: 3.125 mg via ORAL
  Filled 2022-05-07: qty 1

## 2022-05-07 MED ORDER — BUPIVACAINE HCL (PF) 0.25 % IJ SOLN
INTRAMUSCULAR | Status: AC
  Filled 2022-05-07: qty 30

## 2022-05-07 MED ORDER — DEXAMETHASONE 4 MG PO TABS: 4.0000 mg | ORAL_TABLET | Freq: Four times a day (QID) | ORAL | Status: DC

## 2022-05-07 MED ORDER — NITROGLYCERIN 0.4 MG SL SUBL: 0.4000 mg | SUBLINGUAL_TABLET | SUBLINGUAL | Status: DC | PRN

## 2022-05-07 MED ORDER — HYDROMORPHONE HCL 1 MG/ML IJ SOLN
INTRAMUSCULAR | Status: DC | PRN
  Administered 2022-05-07 (×2): .25 mg via INTRAVENOUS

## 2022-05-07 MED ORDER — MENTHOL 3 MG MT LOZG
1.0000 | LOZENGE | OROMUCOSAL | Status: DC | PRN
Start: 2022-05-07 — End: 2022-05-08

## 2022-05-07 MED ORDER — ACETAMINOPHEN 500 MG PO TABS
1000.0000 mg | ORAL_TABLET | Freq: Four times a day (QID) | ORAL | Status: DC
  Administered 2022-05-07 – 2022-05-08 (×2): 1000 mg via ORAL
  Filled 2022-05-07 (×3): qty 2

## 2022-05-07 MED ORDER — ONDANSETRON HCL 4 MG/2ML IJ SOLN: 4.0000 mg | Freq: Once | INTRAMUSCULAR | Status: DC | PRN

## 2022-05-07 MED ORDER — HYDROMORPHONE HCL 1 MG/ML IJ SOLN
INTRAMUSCULAR | Status: AC
  Administered 2022-05-07: 0.5 mg via INTRAVENOUS
  Filled 2022-05-07: qty 2

## 2022-05-07 MED ORDER — ONDANSETRON HCL 4 MG/2ML IJ SOLN
INTRAMUSCULAR | Status: AC
  Filled 2022-05-07: qty 2

## 2022-05-07 SURGICAL SUPPLY — 61 items
BAG COUNTER SPONGE SURGICOUNT (BAG) ×1 IMPLANT
BASKET BONE COLLECTION (BASKET) ×1 IMPLANT
BENZOIN TINCTURE PRP APPL 2/3 (GAUZE/BANDAGES/DRESSINGS) ×1 IMPLANT
BLADE BONE MILL MEDIUM (MISCELLANEOUS) ×1 IMPLANT
BUR CARBIDE MATCH 3.0 (BURR) ×1 IMPLANT
CAGE POROUS ATEC 7X9X25 5D (Cage) IMPLANT
CANISTER SUCT 3000ML PPV (MISCELLANEOUS) ×1 IMPLANT
CNTNR URN SCR LID CUP LEK RST (MISCELLANEOUS) ×1 IMPLANT
CONT SPEC 4OZ STRL OR WHT (MISCELLANEOUS) ×1
COVER BACK TABLE 60X90IN (DRAPES) ×1 IMPLANT
DERMABOND ADVANCED (GAUZE/BANDAGES/DRESSINGS) ×1
DERMABOND ADVANCED .7 DNX12 (GAUZE/BANDAGES/DRESSINGS) ×1 IMPLANT
DRAPE C-ARM 42X72 X-RAY (DRAPES) ×2 IMPLANT
DRAPE C-ARMOR (DRAPES) ×1 IMPLANT
DRAPE LAPAROTOMY 100X72X124 (DRAPES) ×1 IMPLANT
DRAPE SURG 17X23 STRL (DRAPES) ×1 IMPLANT
DRSG OPSITE POSTOP 4X6 (GAUZE/BANDAGES/DRESSINGS) IMPLANT
DURAPREP 26ML APPLICATOR (WOUND CARE) ×1 IMPLANT
ELECT REM PT RETURN 9FT ADLT (ELECTROSURGICAL) ×1
ELECTRODE REM PT RTRN 9FT ADLT (ELECTROSURGICAL) ×1 IMPLANT
EVACUATOR 1/8 PVC DRAIN (DRAIN) ×1 IMPLANT
GAUZE 4X4 16PLY ~~LOC~~+RFID DBL (SPONGE) IMPLANT
GLOVE BIO SURGEON STRL SZ7 (GLOVE) IMPLANT
GLOVE BIO SURGEON STRL SZ8 (GLOVE) ×2 IMPLANT
GLOVE BIOGEL PI IND STRL 7.0 (GLOVE) IMPLANT
GLOVE BIOGEL PI INDICATOR 7.0 (GLOVE) ×1
GOWN STRL REUS W/ TWL LRG LVL3 (GOWN DISPOSABLE) IMPLANT
GOWN STRL REUS W/ TWL XL LVL3 (GOWN DISPOSABLE) ×2 IMPLANT
GOWN STRL REUS W/TWL 2XL LVL3 (GOWN DISPOSABLE) IMPLANT
GOWN STRL REUS W/TWL LRG LVL3 (GOWN DISPOSABLE) ×1
GOWN STRL REUS W/TWL XL LVL3 (GOWN DISPOSABLE) ×3
HEMOSTAT POWDER KIT SURGIFOAM (HEMOSTASIS) ×1 IMPLANT
KIT BASIN OR (CUSTOM PROCEDURE TRAY) ×1 IMPLANT
KIT TURNOVER KIT B (KITS) ×1 IMPLANT
MATRIX SPINE STRIP NEOCORE 5CC (Putty) IMPLANT
NDL HYPO 25X1 1.5 SAFETY (NEEDLE) ×1 IMPLANT
NEEDLE HYPO 25X1 1.5 SAFETY (NEEDLE) ×1 IMPLANT
NS IRRIG 1000ML POUR BTL (IV SOLUTION) ×1 IMPLANT
PACK LAMINECTOMY NEURO (CUSTOM PROCEDURE TRAY) ×1 IMPLANT
PAD ARMBOARD 7.5X6 YLW CONV (MISCELLANEOUS) ×3 IMPLANT
ROD LORD LIPPED TI 5.5X60 (Rod) IMPLANT
ROD LORD LIPPED TI 5.5X65 (Rod) IMPLANT
SCREW ILIAC PA 5.5X40 (Screw) IMPLANT
SCREW POLYAXIAL TULIP (Screw) IMPLANT
SCREW SHANK MOD 5.5X40 (Screw) IMPLANT
SET SCREW (Screw) ×6 IMPLANT
SET SCREW SPNE (Screw) IMPLANT
SPACER IDENTI PS 9X9X25 5D (Spacer) IMPLANT
SPONGE SURGIFOAM ABS GEL 100 (HEMOSTASIS) ×1 IMPLANT
SPONGE T-LAP 4X18 ~~LOC~~+RFID (SPONGE) IMPLANT
STRIP CLOSURE SKIN 1/2X4 (GAUZE/BANDAGES/DRESSINGS) ×2 IMPLANT
STRIP MATRIX NEOCORE 5CC (Putty) ×2 IMPLANT
SUT VIC AB 0 CT1 18XCR BRD8 (SUTURE) ×1 IMPLANT
SUT VIC AB 0 CT1 8-18 (SUTURE) ×1
SUT VIC AB 2-0 CP2 18 (SUTURE) ×1 IMPLANT
SUT VIC AB 3-0 SH 8-18 (SUTURE) ×2 IMPLANT
SYR CONTROL 10ML LL (SYRINGE) ×1 IMPLANT
TOWEL GREEN STERILE (TOWEL DISPOSABLE) ×1 IMPLANT
TOWEL GREEN STERILE FF (TOWEL DISPOSABLE) ×1 IMPLANT
TRAY FOLEY MTR SLVR 16FR STAT (SET/KITS/TRAYS/PACK) ×1 IMPLANT
WATER STERILE IRR 1000ML POUR (IV SOLUTION) ×1 IMPLANT

## 2022-05-07 NOTE — Anesthesia Procedure Notes (Signed)
Procedure Name: Intubation Date/Time: 05/07/2022 12:43 PM  Performed by: Carolan Clines, CRNAPre-anesthesia Checklist: Patient identified, Emergency Drugs available, Suction available and Patient being monitored Patient Re-evaluated:Patient Re-evaluated prior to induction Oxygen Delivery Method: Circle System Utilized Preoxygenation: Pre-oxygenation with 100% oxygen Induction Type: IV induction Ventilation: Mask ventilation without difficulty and Oral airway inserted - appropriate to patient size Laryngoscope Size: Glidescope and 3 Grade View: Grade II Tube type: Oral Tube size: 7.0 mm Number of attempts: 2 Airway Equipment and Method: Oral airway, Rigid stylet and Video-laryngoscopy Placement Confirmation: ETT inserted through vocal cords under direct vision, positive ETCO2 and breath sounds checked- equal and bilateral Secured at: 22 cm Tube secured with: Tape Dental Injury: Teeth and Oropharynx as per pre-operative assessment  Comments: DL x 1 with Mac 3, grade IIb view-redundant tissue. Unable to pass ETT successfully. Glidescope 3 utilized with grade II view. ETT passed successfully.

## 2022-05-07 NOTE — Progress Notes (Signed)
Pharmacy Antibiotic Note  Morgan Patel is a 51 y.o. female admitted on 05/07/2022 with surgical prophylaxis.  Pharmacy has been consulted for vancomycin dosing.   Patient received pre-op vancomycin 1500 mg IV at 11 am on 8/16. No drain. Renal function stable.   Plan: Vancomycin 1500 mg IV x1 12 hrs post procedure Pharmacy will sign off   Height: 5\' 2"  (157.5 cm) Weight: 112.5 kg (248 lb) IBW/kg (Calculated) : 50.1  Temp (24hrs), Avg:98.2 F (36.8 C), Min:98 F (36.7 C), Max:98.4 F (36.9 C)  Recent Labs  Lab 05/05/22 1024  WBC 11.2*  CREATININE 0.69    Estimated Creatinine Clearance: 97.5 mL/min (by C-G formula based on SCr of 0.69 mg/dL).    Allergies  Allergen Reactions   Amlodipine Rash   Losartan Rash     rash on feet and ankles   Quinapril Rash   Hydrochlorothiazide Other (See Comments)    Causes headache.   Atenolol Rash   Penicillins Nausea And Vomiting     Thank you for allowing pharmacy to be a part of this patient's care.  05/07/22, PharmD Pharmacy Resident  05/07/2022 7:46 PM

## 2022-05-07 NOTE — Anesthesia Preprocedure Evaluation (Addendum)
Anesthesia Evaluation  Patient identified by MRN, date of birth, ID band Patient awake    Reviewed: Allergy & Precautions, NPO status , Patient's Chart, lab work & pertinent test results, reviewed documented beta blocker date and time   Airway Mallampati: II  TM Distance: >3 FB Neck ROM: Full    Dental  (+) Missing, Dental Advisory Given   Pulmonary shortness of breath and with exertion, asthma , Current SmokerPatient did not abstain from smoking.,    Pulmonary exam normal breath sounds clear to auscultation       Cardiovascular hypertension, Pt. on medications and Pt. on home beta blockers + CAD  Normal cardiovascular exam Rhythm:Regular Rate:Normal     Neuro/Psych PSYCHIATRIC DISORDERS Anxiety Depression Left side weakness, blurred vision and poor balance  Neuromuscular disease CVA, Residual Symptoms    GI/Hepatic Neg liver ROS, GERD  Medicated and Controlled,  Endo/Other  diabetes, Well Controlled, Type 2, Oral Hypoglycemic AgentsMorbid obesity  Renal/GU negative Renal ROS  negative genitourinary   Musculoskeletal  (+) Arthritis , Osteoarthritis,  Fibromyalgia -Spondylolisthesis L3-4 and L4-5 Low back pain   Abdominal (+) + obese,   Peds  Hematology   Anesthesia Other Findings   Reproductive/Obstetrics                             Anesthesia Physical Anesthesia Plan  ASA: 3  Anesthesia Plan: General   Post-op Pain Management: Ofirmev IV (intra-op)*, Ketamine IV* and Dilaudid IV   Induction: Intravenous and Cricoid pressure planned  PONV Risk Score and Plan: 3 and Treatment may vary due to age or medical condition, Midazolam, Ondansetron and Dexamethasone  Airway Management Planned: Oral ETT  Additional Equipment: None  Intra-op Plan:   Post-operative Plan: Extubation in OR  Informed Consent: I have reviewed the patients History and Physical, chart, labs and discussed the  procedure including the risks, benefits and alternatives for the proposed anesthesia with the patient or authorized representative who has indicated his/her understanding and acceptance.     Dental advisory given  Plan Discussed with: CRNA and Anesthesiologist  Anesthesia Plan Comments:         Anesthesia Quick Evaluation

## 2022-05-07 NOTE — H&P (Signed)
Subjective: Patient is a 51 y.o. female admitted for back and leg pain. Onset of symptoms was several months ago, gradually worsening since that time.  The pain is rated severe, and is located at the across the lower back and radiates to legs. The pain is described as aching and occurs all day. The symptoms have been progressive. Symptoms are exacerbated by exercise, standing, and walking for more than a few minutes. 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  Past Medical History:  Diagnosis Date   Abscess of breast, right 02/04/2015   Acid reflux    Allergies    Anxiety    DDD (degenerative disc disease), lumbar    Depression    Diabetes mellitus without complication (HCC)    Type II   Dyspnea    Hyperlipidemia    Hypertension    Short of breath on exertion    per patient get short of breath with activity   Stroke [facility]) 2017   patient can't remember if it was 2016 or 2017    Past Surgical History:  Procedure Laterality Date   CARDIAC CATHETERIZATION     LUMBAR LAMINECTOMY/DECOMPRESSION MICRODISCECTOMY Left 04/30/2020   Procedure: Laminectomy and Foraminotomy  Lumbar Five-Sacral One - left,  Lumbar Three- Lumbar Four, Lumbar Four- Lumbar Five - bilateral;  Surgeon: Tia Alert, MD;  Location: [facility] OR;  Service: Neurosurgery;  Laterality: Left;  Laminectomy and Foraminotomy  Lumbar Five-Sacral One - left,  Lumbar Three- Lumbar Four, Lumbar Four- Lumbar Five - bilateral   TUBAL LIGATION      Prior to Admission medications   Medication Sig Start Date End Date Taking? Authorizing Provider  aspirin 81 MG chewable tablet Chew 81 mg by mouth daily.   Yes [provider]  Aspirin-Caffeine (BAYER BACK & BODY PO) Take 3 tablets by mouth daily as needed (pain).   Yes [provider]  atorvastatin (LIPITOR) 80 MG tablet Take 80 mg by mouth daily. 09/29/16  Yes [provider]  Biotin 10 MG CAPS Take 10 mg by mouth daily.   Yes [provider]  carvedilol (COREG) 3.125 MG tablet Take 3.125 mg by mouth 2 (two) times daily. 04/18/22  Yes [provider]  cyclobenzaprine (FLEXERIL) 5 MG tablet Take 5 mg by mouth at bedtime as needed for muscle spasms.   Yes [provider]  DULoxetine (CYMBALTA) 60 MG capsule Take 60 mg by mouth 2 (two) times daily.   Yes [provider]  fexofenadine (ALLEGRA) 180 MG tablet Take 180 mg by mouth daily.   Yes [provider]  fluticasone (FLONASE) 50 MCG/ACT nasal spray Place 1 spray into both nostrils daily as needed for allergies.  07/21/16  Yes [provider]  hydrALAZINE (APRESOLINE) 100 MG tablet Take 100 mg by mouth 3 (three) times daily.   Yes [provider]  ibuprofen (ADVIL) 800 MG tablet Take 800 mg by mouth every 8 (eight) hours as needed for moderate pain.   Yes [provider]  metFORMIN (GLUCOPHAGE) 500 MG tablet Take 1,000 mg by mouth 2 (two) times daily with a meal.    Yes [provider]  nitroGLYCERIN (NITROSTAT) 0.4 MG SL tablet Place 0.4 mg under the tongue every 5 (five) minutes as needed for chest pain.    Yes [provider]  oxyCODONE-acetaminophen (PERCOCET/ROXICET) 5-325 MG tablet Take 1 tablet by mouth every 12 (twelve) hours as needed for severe pain.   Yes [provider]  pregabalin (LYRICA) 150 MG capsule Take 150 mg by mouth 2 (two) times daily.   Yes [provider]  TRULICITY 1.5 MG/0.5ML SOPN Inject 1.5 mg into the skin every Sunday. 02/25/22  Yes [provider]  verapamil (CALAN-SR) 240 MG CR tablet Take 240 mg by mouth 2 (two) times daily.   Yes [provider]  vitamin B-12 (CYANOCOBALAMIN) 500 MCG tablet Take 500 mcg by mouth daily.   Yes [provider]  glucose blood (FREESTYLE TEST STRIPS) test strip 1 strip as needed. 02/07/16   [provider]   Allergies  Allergen Reactions   Amlodipine Rash   Losartan Rash      rash on feet and ankles   Quinapril Rash   Hydrochlorothiazide Other (See Comments)    Causes headache.   Atenolol Rash   Penicillins Nausea And Vomiting    Social History   Tobacco Use   Smoking status: Every Day    Packs/day: 1.00    Types: Cigarettes   Smokeless tobacco: Never  Substance Use Topics   Alcohol use: No    Family History  Problem Relation Age of Onset   Hypertension Mother    Diabetes Mother    Osteoarthritis Mother    Hypertension Father    Stroke Father    Osteoarthritis Father    Cancer Brother      Review of Systems  Positive ROS: neg  All other systems have been reviewed and were otherwise negative with the exception of those mentioned in the HPI and as above.  Objective: Vital signs in last 24 hours: Temp:  [98.4 F (36.9 C)] 98.4 F (36.9 C) (08/16 1016) Pulse Rate:  [95] 95 (08/16 1016) Resp:  [18] 18 (08/16 1016) BP: (166)/(86) 166/86 (08/16 1016) SpO2:  [94 %] 94 % (08/16 1016) Weight:  [112.5 kg] 112.5 kg (08/16 1016)  General Appearance: Alert, cooperative, no distress, appears stated age Head: Normocephalic, without obvious abnormality, atraumatic Eyes: PERRL, conjunctiva/corneas clear, EOM's intact    Neck: Supple, symmetrical, trachea midline Back: Symmetric, no curvature, ROM normal, no CVA tenderness Lungs:  respirations unlabored Heart: Regular rate and rhythm Abdomen: Soft, non-tender Extremities: Extremities normal, atraumatic, no cyanosis or edema Pulses: 2+ and symmetric all extremities Skin: Skin color, texture, turgor normal, no rashes or lesions  NEUROLOGIC:   Mental status: Alert and oriented x4,  no aphasia, good attention span, fund of knowledge, and memory Motor Exam - grossly normal Sensory Exam - grossly normal Reflexes: trace Coordination - grossly normal Gait - grossly normal Balance - grossly normal Cranial Nerves: I: smell Not tested  II: visual acuity  OS: nl    OD: nl  II: visual fields Full to  confrontation  II: pupils Equal, round, reactive to light  III,VII: ptosis None  III,IV,VI: extraocular muscles  Full ROM  V: mastication Normal  V: facial light touch sensation  Normal  V,VII: corneal reflex  Present  VII: facial muscle function - upper  Normal  VII: facial muscle function - lower Normal  VIII: hearing Not tested  IX: soft palate elevation  Normal  IX,X: gag reflex Present  XI: trapezius strength  5/5  XI: sternocleidomastoid strength 5/5  XI: neck flexion strength  5/5  XII: tongue strength  Normal    Data Review Lab Results  Component Value Date   WBC 11.2 (H) 05/05/2022   HGB 14.0 05/05/2022   HCT 42.5 05/05/2022   MCV 91.6 05/05/2022   PLT 354 05/05/2022  Lab Results  Component Value Date   NA 137 05/05/2022   K 3.6 05/05/2022   CL 104 05/05/2022   CO2 25 05/05/2022   BUN 7 05/05/2022   CREATININE 0.69 05/05/2022   GLUCOSE 185 (H) 05/05/2022   Lab Results  Component Value Date   INR 1.0 05/05/2022    Assessment/Plan:  Estimated body mass index is 45.36 kg/m as calculated from the following:   Height as of this encounter: 5\' 2"  (1.575 m).   Weight as of this encounter: 112.5 kg. Patient admitted for PLIF L3-4 l4-5. Patient has failed a reasonable attempt at conservative therapy.  I explained the condition and procedure to the patient and answered any questions.  Patient wishes to proceed with procedure as planned. Understands risks/ benefits and typical outcomes of procedure.   05/07/2022 12:04 PM

## 2022-05-07 NOTE — Transfer of Care (Signed)
Immediate Anesthesia Transfer of Care Note  Patient: Morgan Patel  Procedure(s) Performed: LUMBAR THREE-FOUR, LUMBAR FOUR-FIVE POSTERIOR LUMBAR INTERBODY FUSION (Spine Lumbar)  Patient Location: PACU  Anesthesia Type:General  Level of Consciousness: drowsy  Airway & Oxygen Therapy: Patient Spontanous Breathing and Patient connected to face mask oxygen  Post-op Assessment: Report given to RN and Post -op Vital signs reviewed and stable  Post vital signs: Reviewed and stable  Last Vitals:  Vitals Value Taken Time  BP 151/85 05/07/22 1700  Temp    Pulse 101 05/07/22 1702  Resp 21 05/07/22 1702  SpO2 99 % 05/07/22 1702  Vitals shown include unvalidated device data.  Last Pain:  Vitals:   05/07/22 1040  TempSrc:   PainSc: 0-No pain      Patients Stated Pain Goal: 0 (05/07/22 1040)  Complications: No notable events documented.

## 2022-05-07 NOTE — Op Note (Signed)
05/07/2022  4:40 PM  PATIENT:  Morgan Patel  51 y.o. 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  Redo decompressive lumbar laminectomy, hemi facetectomy and foraminotomies L3 4 L4-5 requiring more work than would be required for a simple exposure of the disk for PLIF in order to adequately decompress the neural elements and address the spinal stenosis 2. Posterior lumbar interbody fusion L3-4 L4-5 using PTI interbody cages packed with morcellized allograft and autograft  3. Posterior fixation L3-L5 inclusive using contact cortical pedicle screws.  4. Intertransverse arthrodesis L3 to L5 using morcellized autograft and allograft.  SURGEON:  Marikay Alar, MD  ASSISTANTS: Verlin Dike FNP  ANESTHESIA:  General  EBL: 350 ml  Total I/O In: 1000 [I.V.:1000] Out: 685 [Urine:335; Blood:350]  BLOOD ADMINISTERED: None  DRAINS: none   INDICATION FOR PROCEDURE: This patient presented with back pain with leg pain. Imaging revealed postlaminectomy spondylolisthesis L3-4 with recurrent spinal stenosis L3-4 and L4-5 after previous decompressive laminectomies L3-4 and L4-5 in the past. The patient tried a reasonable attempt at conservative medical measures without relief. I recommended decompression and instrumented fusion to address the stenosis as well as the segmental  instability.  Patient understood the risks, benefits, and alternatives and potential outcomes and wished to proceed.  PROCEDURE DETAILS:  The patient was brought to the operating room. After induction of generalized endotracheal anesthesia the patient was rolled into the prone position on chest rolls and all pressure points were padded. The patient's lumbar region was cleaned and then prepped with DuraPrep and draped in the usual sterile fashion. Anesthesia was injected and then a  dorsal midline incision was made and carried down to the lumbosacral fascia. The fascia was opened and the paraspinous musculature was taken down in a subperiosteal fashion to expose L3-4 and L4-5. A self-retaining retractor was placed. Intraoperative fluoroscopy confirmed my level, and I started with placement of the L3 cortical pedicle screws. The pedicle screw entry zones were identified utilizing surface landmarks and  AP and lateral fluoroscopy. I scored the cortex with the high-speed drill and then used the hand drill to drill an upward and outward direction into the pedicle. I then tapped line to line. I then placed a 5.5 x 40 mm cortical pedicle screw into the pedicles of L3 bilaterally.    I then turned my attention to the decompression and complete lumbar laminectomies, hemi- facetectomies, and foraminotomies were performed at L3-4 and L4-5.  My nurse practitioner was directly involved in the decompression and exposure of the neural elements. the patient had significant spinal stenosis and this required more work than would be required for a simple exposure of the disc for posterior lumbar interbody fusion which would only require a limited laminotomy. Much more generous decompression and generous foraminotomy was undertaken in order to adequately decompress the neural elements and address the patient's leg pain.  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  Great care was taken to dissect between the fibrosis and the bony elements to do the facetectomies.  At no time did we see the CSF.  The yellow ligament was removed to expose the underlying dura and nerve roots, and generous foraminotomies were performed to adequately decompress the neural elements. Both the exiting and traversing nerve roots were decompressed on both sides until a coronary dilator passed easily along the nerve roots. Once the decompression was  complete, I turned my attention to the posterior lower lumbar  interbody fusion. The epidural venous vasculature was coagulated and cut sharply. Disc space was incised and the initial discectomy was performed with pituitary rongeurs. The disc space was distracted with sequential distractors to a height of 9 mm at L3-4 and 8 mm at L4-5. We then used a series of scrapers and shavers to prepare the endplates for fusion. The midline was prepared with Epstein curettes. Once the complete discectomy was finished, we packed an appropriate sized interbody cage with local autograft and morcellized allograft, gently retracted the nerve root, and tapped the cage into position at L3-4 and L4-5.  The midline between the cages was packed with morselized autograft and allograft.   We then turned our attention to the placement of the lower pedicle screws. The pedicle screw entry zones were identified utilizing surface landmarks and fluoroscopy. I drilled into each pedicle utilizing the hand drill, and tapped each pedicle with the appropriate tap. We palpated with a ball probe to assure no break in the cortex. We then placed 5.5 x 40 mm pedicle screws into the pedicles bilaterally at L4 and L5.  My nurse practitioner assisted in placement of the pedicle screws.  We then decorticated the transverse processes and laid a mixture of morcellized autograft and allograft out over these to perform intertransverse arthrodesis at L3-L5. We then placed lordotic rods into the multiaxial screw heads of the pedicle screws and locked these in position with the locking caps and anti-torque device. We then checked our construct with AP and lateral fluoroscopy. Irrigated with copious amounts of bacitracin-containing saline solution. Inspected the nerve roots once again to assure adequate decompression, lined to the dura with Gelfoam,  and then we closed the muscle and the fascia with 0 Vicryl. Closed the subcutaneous tissues with 2-0 Vicryl and subcuticular tissues with 3-0 Vicryl. The skin was closed with  benzoin and Steri-Strips. Dressing was then applied, the patient was awakened from general anesthesia and transported to the recovery room in stable condition. At the end of the procedure all sponge, needle and instrument counts were correct.   PLAN OF CARE: admit to inpatient  PATIENT DISPOSITION:  PACU - hemodynamically stable.   Delay start of Pharmacological VTE agent (>24hrs) due to surgical blood loss or risk of bleeding:  yes

## 2022-05-08 DIAGNOSIS — M48061 Spinal stenosis, lumbar region without neurogenic claudication: Secondary | ICD-10-CM | POA: Diagnosis not present

## 2022-05-08 LAB — GLUCOSE, CAPILLARY: Glucose-Capillary: 247 mg/dL — ABNORMAL HIGH (ref 70–99)

## 2022-05-08 MED ORDER — METHOCARBAMOL 500 MG PO TABS: 500.0000 mg | ORAL_TABLET | Freq: Four times a day (QID) | ORAL | 1 refills | Status: AC | PRN

## 2022-05-08 MED ORDER — OXYCODONE HCL 10 MG PO TABS: 10.0000 mg | ORAL_TABLET | ORAL | 0 refills | Status: AC | PRN

## 2022-05-08 MED FILL — Thrombin For Soln 5000 Unit: CUTANEOUS | Qty: 5000 | Status: AC

## 2022-05-08 NOTE — Evaluation (Signed)
Occupational Therapy Evaluation Patient Details Name: Morgan Patel MRN: [id number] DOB: [birth year] Today's Date: 05/08/2022   History of Present Illness 51 yo female s/p PLIF L3-5 on 8/16. PMH including stroke (2016/2017?), HTN, DM, DDD, anxiety, and lumbar lami (2021).   Clinical Impression   PTA, pt was living with her daughter and was performing BADLs, simple IADLs, and using rollator for mobility outside the home. Pt reports she has an aid who comes for 3 hours each day who assists with bathing and IADLs. Currently, pt performing at Mod I level for ADLs and functional mobility with increased time. Provided education and handout on back precautions, grooming, LB ADLs, toileting, stair management, car transfer, and tub transfer; pt demonstrated understanding. Pt also reporting her brace is at home; educating pt on need to wear brace with OOB activity and she verbalized understanding. Answered all pt questions. Recommend dc home once medically stable per physician. All acute OT needs met and will sign off. Thank you.      Recommendations for follow up therapy are one component of a multi-disciplinary discharge planning process, led by the attending physician.  Recommendations may be updated based on patient status, additional functional criteria and insurance authorization.   Follow Up Recommendations  No OT follow up    Assistance Recommended at Discharge Intermittent Supervision/Assistance  Patient can return home with the following      Functional Status Assessment  Patient has had a recent decline in their functional status and demonstrates the ability to make significant improvements in function in a reasonable and predictable amount of time.  Equipment Recommendations  None recommended by OT    Recommendations for Other Services       Precautions / Restrictions Precautions Precautions: Back Precaution Booklet Issued: Yes (comment) Precaution Comments: Reviewed in  full Required Braces or Orthoses: Spinal Brace Spinal Brace: Lumbar corset (Reports her brace is at home; educating pt on need to wear brace anytime she is OOB) Restrictions Weight Bearing Restrictions: No      Mobility Bed Mobility               General bed mobility comments: Pt verbalizing log roll technique    Transfers Overall transfer level: Modified independent                 General transfer comment: increased time      Balance Overall balance assessment: No apparent balance deficits (not formally assessed)                                         ADL either performed or assessed with clinical judgement   ADL Overall ADL's : Modified independent                                       General ADL Comments: Providing education on back precautions, bed mobility, car transfer, LB ADLs, toileting, tub transfer, and one step management. Pt performing with increased time.     Vision         Perception     Praxis      Pertinent Vitals/Pain Pain Assessment Pain Assessment: Faces Faces Pain Scale: Hurts a little bit Pain Location: Back Pain Descriptors / Indicators: Discomfort Pain Intervention(s): Monitored during session, Limited activity within patient's tolerance, Repositioned  Hand Dominance     Extremity/Trunk Assessment Upper Extremity Assessment Upper Extremity Assessment: Overall WFL for tasks assessed   Lower Extremity Assessment Lower Extremity Assessment: Overall WFL for tasks assessed   Cervical / Trunk Assessment Cervical / Trunk Assessment: Back Surgery;Other exceptions Cervical / Trunk Exceptions: Increased body habitus   Communication Communication Communication: No difficulties   Cognition Arousal/Alertness: Awake/alert Behavior During Therapy: WFL for tasks assessed/performed, Restless, Impulsive Overall Cognitive Status: Within Functional Limits for tasks assessed Area of Impairment:  Attention                   Current Attention Level: Selective, Alternating           General Comments: Slightly impulsive and restless; eager to return home. Feel pt is at baseline cognition. Requiring increased cues for attention     General Comments       Exercises     Shoulder Instructions      Home Living Family/patient expects to be discharged to:: Private residence Living Arrangements: Children Available Help at Discharge: Family;Available 24 hours/day Type of Home: Apartment (First floor) Home Access: Stairs to enter Entrance Stairs-Number of Steps: 1 Entrance Stairs-Rails: None Home Layout: One level     Bathroom Shower/Tub: Teacher, early years/pre: Standard     Home Equipment: Air cabin crew (4 wheels)   Additional Comments: Has an aide who comes 3 hours each day      Prior Functioning/Environment Prior Level of Function : Needs assist             Mobility Comments: Uses rollator for mobility outside her apartment, or to sit while doing dishes ADLs Comments: Aide assists with bathing, her hair, and IADLs. Daughter does IADLs.        OT Problem List: Decreased range of motion;Decreased knowledge of use of DME or AE;Decreased knowledge of precautions      OT Treatment/Interventions:      OT Goals(Current goals can be found in the care plan section) Acute Rehab OT Goals Patient Stated Goal: Go home OT Goal Formulation: All assessment and education complete, DC therapy  OT Frequency:      Co-evaluation              AM-PAC OT "6 Clicks" Daily Activity     Outcome Measure Help from another person eating meals?: None Help from another person taking care of personal grooming?: None Help from another person toileting, which includes using toliet, bedpan, or urinal?: None Help from another person bathing (including washing, rinsing, drying)?: None Help from another person to put on and taking off regular upper body  clothing?: None Help from another person to put on and taking off regular lower body clothing?: None 6 Click Score: 24   End of Session Nurse Communication: Mobility status  Activity Tolerance: Patient tolerated treatment well Patient left: in bed;with call bell/phone within reach (sitting EOB and wanting to go home)  OT Visit Diagnosis: Unsteadiness on feet (R26.81);Other abnormalities of gait and mobility (R26.89);Muscle weakness (generalized) (M62.81)                Time: 2130-8657 OT Time Calculation (min): 11 min Charges:  OT General Charges $OT Visit: 1 Visit OT Evaluation $OT Eval Low Complexity: 1 Low  Glora Hulgan MSOT, OTR/L Acute Rehab Office: [place] 05/08/2022, 9:01 AM

## 2022-05-08 NOTE — Plan of Care (Signed)
Pt doing well. Pt given D/C instructions with verbal understanding. Rx's were sent to the pharmacy by MD. Pt's incision is clean and dry with no sign of infection. Pt's IV was removed prior to D/C. Pt D/C'd home via wheelchair per MD order. Pt is stable @ D/C and has no other needs at this time. Verda Mehta, RN  

## 2022-05-08 NOTE — Anesthesia Postprocedure Evaluation (Signed)
Anesthesia Post Note  Patient: Morgan Patel  Procedure(s) Performed: LUMBAR THREE-FOUR, LUMBAR FOUR-FIVE POSTERIOR LUMBAR INTERBODY FUSION (Spine Lumbar)     Patient location during evaluation: PACU Anesthesia Type: General Level of consciousness: awake and alert Pain management: pain level controlled Vital Signs Assessment: post-procedure vital signs reviewed and stable Respiratory status: spontaneous breathing, nonlabored ventilation, respiratory function stable and patient connected to nasal cannula oxygen Cardiovascular status: blood pressure returned to baseline and stable Postop Assessment: no apparent nausea or vomiting Anesthetic complications: no   No notable events documented.  Last Vitals:  Vitals:   05/07/22 2318 05/08/22 0355  BP: 139/69 (!) 149/85  Pulse: (!) 104 93  Resp: 18 18  Temp: 36.9 C 36.8 C  SpO2: 92% 92%    Last Pain:  Vitals:   05/08/22 0518  TempSrc:   PainSc: Asleep                 Jabar Krysiak

## 2022-05-08 NOTE — Discharge Summary (Signed)
Physician Discharge Summary  Patient ID: Morgan Patel MRN: [id number] DOB/AGE: Dec 20, 1970 51 y.o.  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 The patient tolerated the procedure well and was taken to the recovery room and then to the floor in stable condition. The hospital course was routine. There were no complications. The wound remained clean dry and intact. Pt had appropriate mild back soreness. No complaints of leg pain or new N/T/W. The patient remained afebrile with stable vital signs, and tolerated a regular diet. The patient continued to increase activities, and pain was well controlled with oral pain medications.   Consults: None  Significant Diagnostic Studies:  Results for orders placed or performed during the hospital encounter of 05/07/22  Glucose, capillary  Result Value Ref Range   Glucose-Capillary 201 (H) 70 - 99 mg/dL  Glucose, capillary  Result Value Ref Range   Glucose-Capillary 130 (H) 70 - 99 mg/dL  Glucose, capillary  Result Value Ref Range   Glucose-Capillary 160 (H) 70 - 99 mg/dL  Glucose, capillary  Result Value Ref Range   Glucose-Capillary 161 (H) 70 - 99 mg/dL  Hemoglobin N8G  Result Value Ref Range   Hgb A1c MFr Bld 7.6 (H) 4.8 - 5.6 %   Mean Plasma Glucose 171.42 mg/dL  Glucose, capillary  Result Value Ref Range   Glucose-Capillary 311 (H) 70 - 99 mg/dL   Comment 1 Notify RN    Comment 2 Document in Chart   Glucose, capillary  Result Value Ref Range   Glucose-Capillary 354 (H) 70 - 99 mg/dL   Comment 1 Notify RN    Comment 2 Document in Chart   Glucose, capillary  Result Value Ref  Range   Glucose-Capillary 247 (H) 70 - 99 mg/dL   Comment 1 Notify RN    Comment 2 Document in Chart   ABO/Rh  Result Value Ref Range   ABO/RH(D)      O POS Performed at [facility], [address] [place], [place] [address]     DG Lumbar Spine 2-3 Views  Result Date: 05/07/2022 CLINICAL DATA:  51 year old female undergoing lumbar spine surgery. EXAM: LUMBAR SPINE - 2-3 VIEW COMPARISON:  10/28/2021 FINDINGS: Intraoperative fluoroscopic images demonstrate L3-L5 posterior spinal instrumented fusion with bilateral pedicle screws and dorsal rod constructs at each level. IMPRESSION: Intraoperative images from L3-L5 PSIF. Electronically Signed   By: Marliss Coots M.D.   On: 05/07/2022 16:35   DG C-Arm 1-60 Min-No Report  Result Date: 05/07/2022 Fluoroscopy was utilized by the requesting physician.  No radiographic interpretation.   DG C-Arm 1-60 Min-No Report  Result Date: 05/07/2022 Fluoroscopy was utilized by the requesting physician.  No radiographic interpretation.   DG C-Arm 1-60 Min-No Report  Result Date: 05/07/2022 Fluoroscopy was utilized by the requesting physician.  No radiographic interpretation.    Antibiotics:  Anti-infectives (From admission, onward)    Start     Dose/Rate Route Frequency Ordered Stop   05/07/22 2300  vancomycin (VANCOREADY) IVPB 1500 mg/300 mL        1,500 mg 150 mL/hr over 120 Minutes Intravenous  Once 05/07/22 1958 05/08/22 0002   05/07/22 1621  vancomycin (VANCOCIN) powder  Status:  Discontinued          As needed 05/07/22 1622 05/07/22 1650   05/07/22 1130  vancomycin (VANCOCIN) IVPB 1000 mg/200 mL premix  Status:  Discontinued        1,000 mg 200 mL/hr over 60 Minutes Intravenous On call to O.R. 05/07/22 1014 05/07/22 1032   05/07/22 1100  vancomycin (VANCOREADY) IVPB 1500 mg/300 mL        1,500 mg 150 mL/hr over 120 Minutes Intravenous On call to O.R. 05/07/22 1032 05/07/22 1308       Discharge Exam: Blood pressure (!) 149/85,  pulse 93, temperature 98.2 F (36.8 C), temperature source Oral, resp. rate 18, height 5\' 2"  (1.575 m), weight 112.5 kg, SpO2 92 %. Neurologic: Alert and oriented X 3, normal strength and tone. Normal symmetric reflexes. Normal coordination and gait  Incision clean dry and intact with dry dressing  Discharge Medications:   Allergies as of 05/08/2022       Reactions   Amlodipine Rash   Losartan Rash    rash on feet and ankles   Quinapril Rash   Hydrochlorothiazide Other (See Comments)   Causes headache.   Atenolol Rash   Penicillins Nausea And Vomiting        Medication List     STOP taking these medications    cyclobenzaprine 5 MG tablet Commonly known as: FLEXERIL   oxyCODONE-acetaminophen 5-325 MG tablet Commonly known as: PERCOCET/ROXICET       TAKE these medications    aspirin 81 MG chewable tablet Chew 81 mg by mouth daily.   atorvastatin 80 MG tablet Commonly known as: LIPITOR Take 80 mg by mouth daily.   BAYER BACK & BODY PO Take 3 tablets by mouth daily as needed (pain).   Biotin 10 MG Caps Take 10 mg by mouth daily.   carvedilol 3.125 MG tablet Commonly known as: COREG Take 3.125 mg by mouth 2 (two) times daily.   DULoxetine 60 MG capsule Commonly known as: CYMBALTA Take 60 mg by mouth 2 (two) times daily.   fexofenadine 180 MG tablet Commonly known as: ALLEGRA Take 180 mg by mouth daily.   fluticasone 50 MCG/ACT nasal spray Commonly known as: FLONASE Place 1 spray into both nostrils daily as needed for allergies.   FREESTYLE TEST STRIPS test strip Generic drug: glucose blood 1 strip as needed.   hydrALAZINE 100 MG tablet Commonly known as: APRESOLINE Take 100 mg by mouth 3 (three) times daily.   ibuprofen 800 MG tablet Commonly known as: ADVIL Take 800 mg by mouth every 8 (eight) hours as needed for moderate pain.   metFORMIN 500 MG tablet Commonly known as: GLUCOPHAGE Take 1,000 mg by mouth 2 (two) times daily with a meal.    methocarbamol 500 MG tablet Commonly known as: ROBAXIN Take 1 tablet (500 mg total) by mouth every 6 (six) hours as needed for muscle spasms.   nitroGLYCERIN 0.4 MG SL tablet Commonly known as: NITROSTAT Place 0.4 mg under the tongue every 5 (five) minutes as needed for chest pain.   Oxycodone HCl 10 MG Tabs Take 1 tablet (10 mg total) by mouth every 4 (four) hours as needed for severe pain ((score 7 to 10)).   pregabalin 150 MG capsule Commonly known as: LYRICA Take 150 mg by mouth 2 (two) times daily.   Trulicity 1.5 MG/0.5ML Sopn Generic drug: Dulaglutide Inject 1.5 mg into the skin every Sunday.   verapamil 240 MG CR tablet Commonly known as: CALAN-SR Take 240  mg by mouth 2 (two) times daily.   vitamin B-12 500 MCG tablet Commonly known as: CYANOCOBALAMIN Take 500 mcg by mouth daily.               Durable Medical Equipment  (From admission, onward)           Start     Ordered   05/07/22 1842  DME Walker rolling  Once       Question:  Patient needs a walker to treat with the following condition  Answer:  S/P lumbar fusion   05/07/22 1841   05/07/22 1842  DME 3 n 1  Once        05/07/22 1841            Disposition: Home   Final Dx: Posterior lumbar interbody fusion L3-4 L4-5  Discharge Instructions      Remove dressing in 72 hours   Complete by: As directed    Call MD for:  difficulty breathing, headache or visual disturbances   Complete by: As directed    Call MD for:  persistant nausea and vomiting   Complete by: As directed    Call MD for:  redness, tenderness, or signs of infection (pain, swelling, redness, odor or green/yellow discharge around incision site)   Complete by: As directed    Call MD for:  severe uncontrolled pain   Complete by: As directed    Call MD for:  temperature >100.4   Complete by: As directed    Diet - low sodium heart healthy   Complete by: As directed    Discharge instructions   Complete by: As directed     No driving, no heavy lifting, no lifting more than 8 pounds, no repetitive bending, take it easy, no exercise, may shower   Increase activity slowly   Complete by: As directed         Follow-up Information     Tia Alert, MD. Schedule an appointment as soon as possible for a visit in 2 week(s).   Specialty: Neurosurgery Contact information: [address] [place] [address] [phone]                  Signed: Tia Alert 05/08/2022, 7:42 AM

## 2022-11-03 ENCOUNTER — Other Ambulatory Visit: Payer: Self-pay

## 2022-11-03 DIAGNOSIS — I739 Peripheral vascular disease, unspecified: Secondary | ICD-10-CM

## 2022-11-10 ENCOUNTER — Encounter: Payer: 59 | Admitting: Vascular Surgery

## 2022-11-10 ENCOUNTER — Ambulatory Visit (HOSPITAL_COMMUNITY): Payer: 59

## 2022-12-18 ENCOUNTER — Encounter: Payer: 59 | Admitting: Vascular Surgery

## 2022-12-18 ENCOUNTER — Ambulatory Visit (HOSPITAL_COMMUNITY): Payer: 59

## 2023-01-15 ENCOUNTER — Ambulatory Visit (HOSPITAL_COMMUNITY): Payer: 59

## 2023-01-15 ENCOUNTER — Encounter: Payer: 59 | Admitting: Vascular Surgery

## 2023-01-28 ENCOUNTER — Ambulatory Visit (HOSPITAL_COMMUNITY): Payer: 59

## 2023-01-28 ENCOUNTER — Encounter: Payer: 59 | Admitting: Vascular Surgery

## 2023-03-12 ENCOUNTER — Encounter: Payer: 59 | Admitting: Vascular Surgery

## 2023-03-12 ENCOUNTER — Ambulatory Visit (HOSPITAL_COMMUNITY): Payer: 59

## 2023-05-04 ENCOUNTER — Ambulatory Visit (HOSPITAL_COMMUNITY): Payer: 59 | Attending: Vascular Surgery

## 2023-05-04 ENCOUNTER — Encounter: Payer: 59 | Admitting: Surgery

## 2023-10-08 ENCOUNTER — Other Ambulatory Visit: Payer: Self-pay

## 2023-10-08 DIAGNOSIS — I739 Peripheral vascular disease, unspecified: Secondary | ICD-10-CM

## 2023-10-14 ENCOUNTER — Encounter: Payer: 59 | Admitting: Vascular Surgery

## 2023-10-14 ENCOUNTER — Ambulatory Visit (HOSPITAL_COMMUNITY): Payer: 59

## 2023-11-25 ENCOUNTER — Encounter: Payer: 59 | Admitting: Vascular Surgery

## 2023-11-25 ENCOUNTER — Ambulatory Visit (HOSPITAL_COMMUNITY): Payer: 59

## 2024-01-29 ENCOUNTER — Ambulatory Visit: Admitting: Vascular Surgery

## 2024-01-29 ENCOUNTER — Ambulatory Visit (HOSPITAL_COMMUNITY)

## 2024-02-07 IMAGING — MR MR CERVICAL SPINE W/O CM
4 of 5 series · 26 of 48 positions shown · non-contrast
Comparison: Radiographs of the cervical spine 07/26/2020 (images
available, report unavailable). Cervical spine MRI 07/25/2020. head
CT 09/02/2015.

CLINICAL DATA: Provided history: Cervical stenosis of spine.
Additional history provided by scanning technologist: Patient
reports neck and back pain for 8 years. Weakness, pain and numbness
in left arm and leg.

EXAM:
MRI CERVICAL SPINE WITHOUT CONTRAST
TECHNIQUE: Multiplanar, multisequence MR imaging of the cervical spine was
performed. No intravenous contrast was administered.

[Series 6: T1 · sagittal · 3.0mm · 0.66mm/px · 7 of 15 slices shown]
[im 1/15]
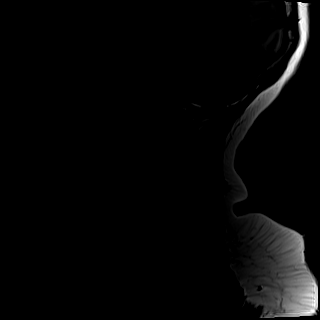
[im 3/15]
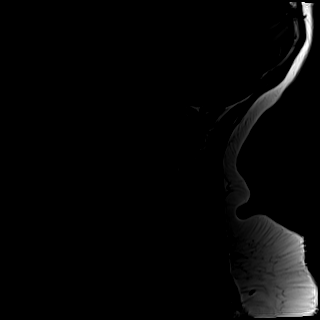
[im 5/15]
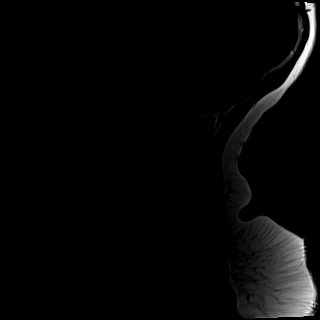
[im 8/15]
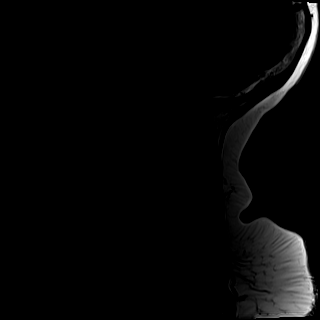
[im 10/15]
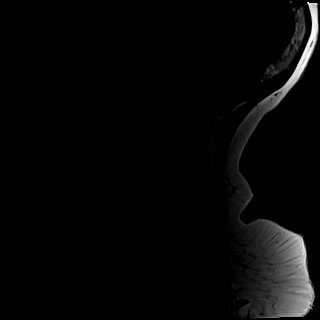
[im 12/15]
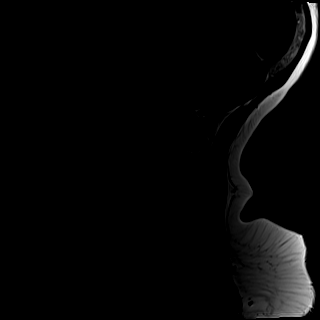
[im 15/15]
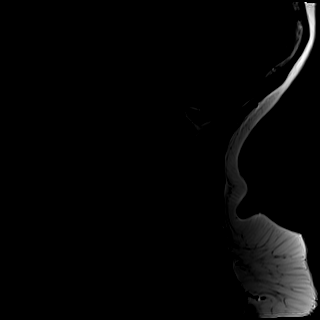

[Series 7: T2 · sagittal · 3.0mm · 0.55mm/px · 7 of 15 slices shown (1 of 2)]
[im 1/15]
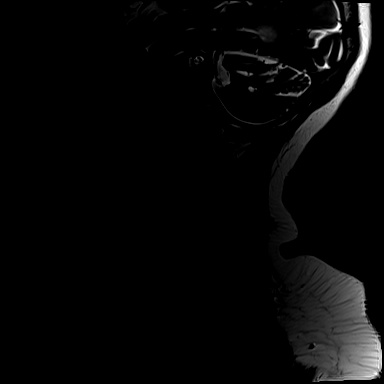
[im 3/15]
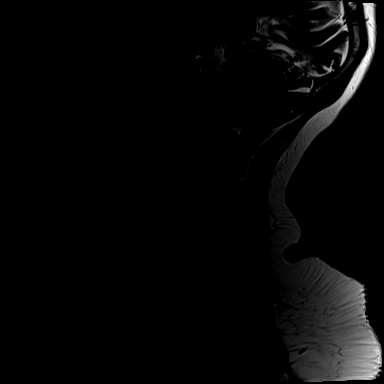
[im 5/15]
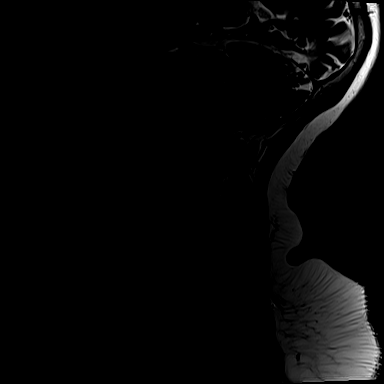
[im 8/15]
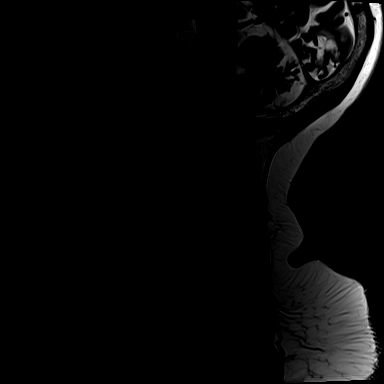
[im 10/15]
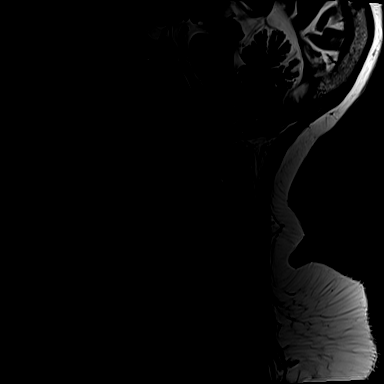
[im 12/15]
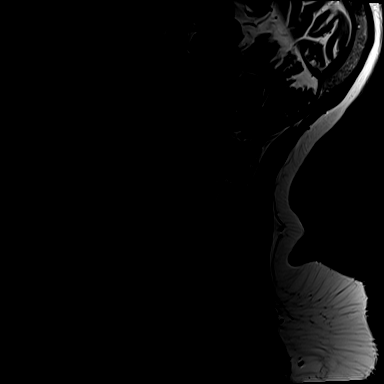
[im 15/15]
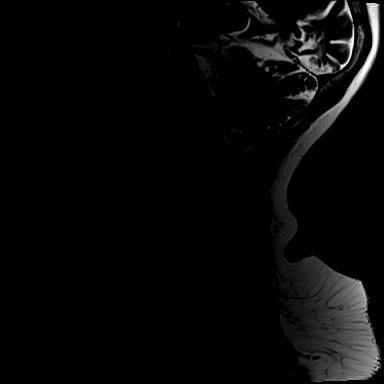

[Series 8: STIR · sagittal · 3.0mm · 0.33mm/px · 4 of 15 slices shown]
[im 1/15]
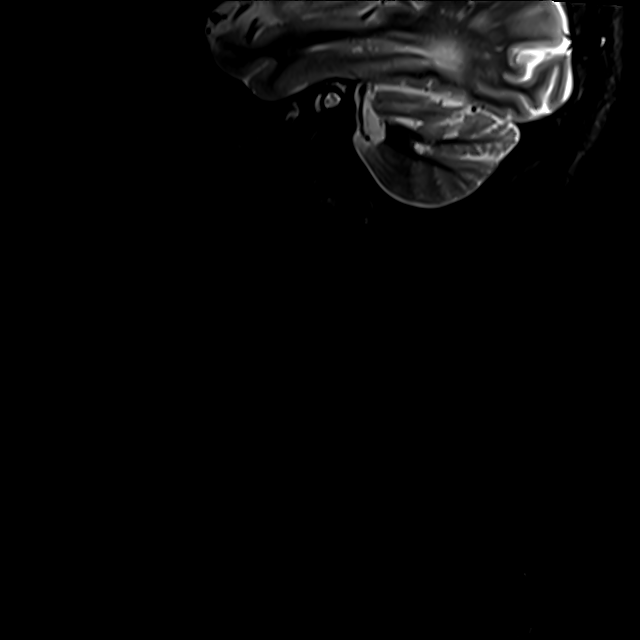
[im 3/15]
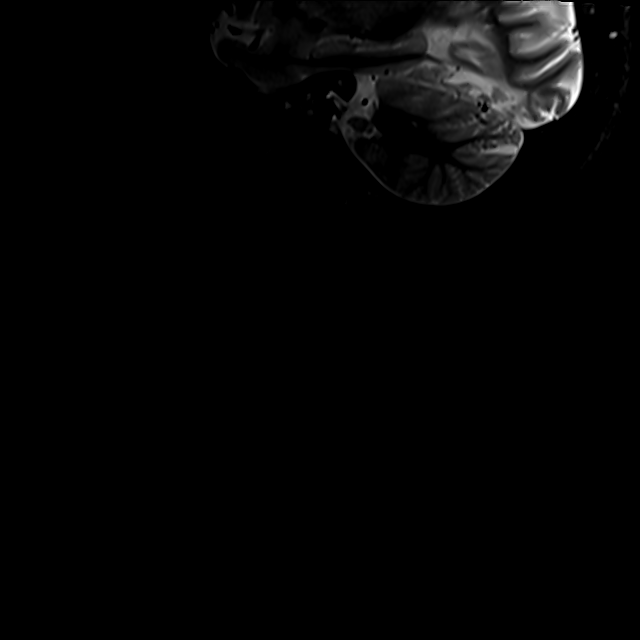
[im 9/15]
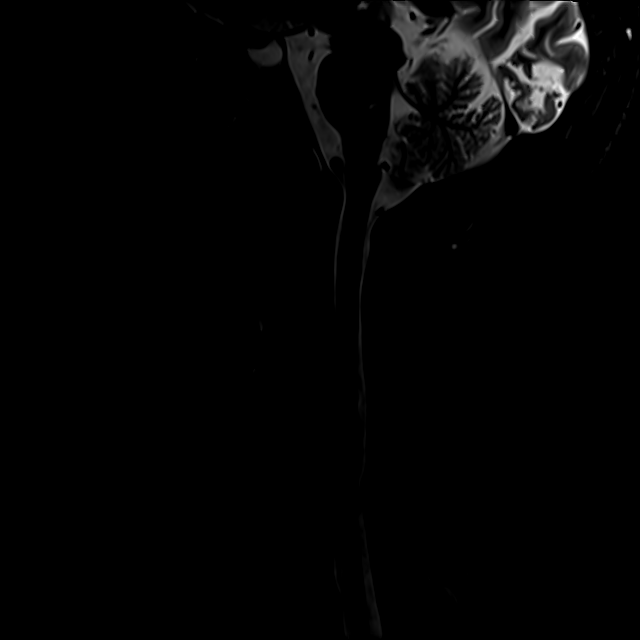
[im 15/15]
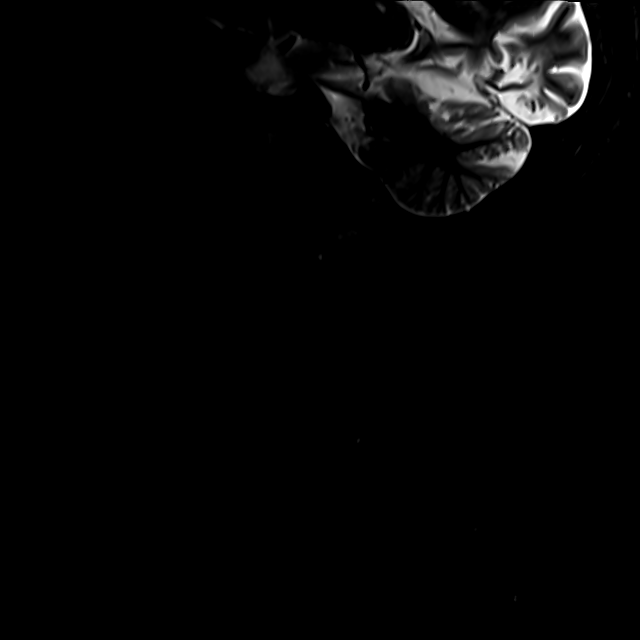

[Series 9: T2 · axial · 3.0mm · 0.50mm/px · z∈[-77,+24]mm · 8 of 33 slices shown (2 of 2)]
[im 1/33]
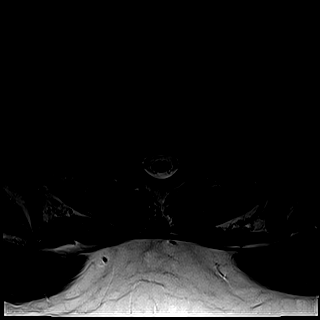
[im 5/33]
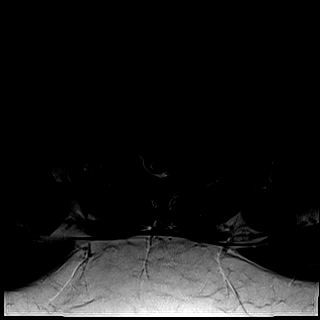
[im 10/33]
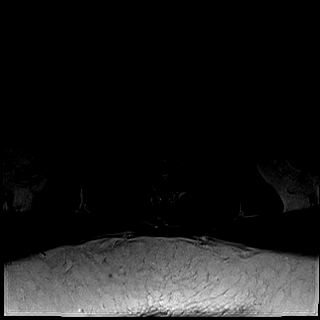
[im 15/33]
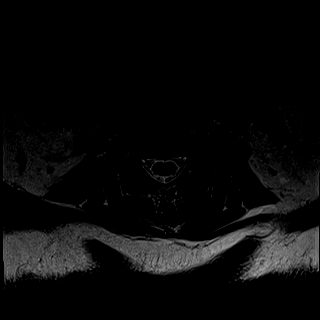
[im 18/33]
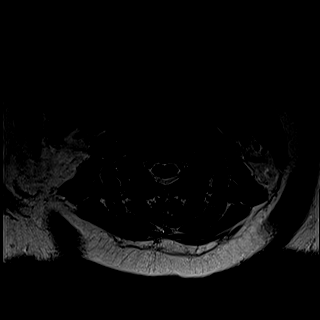
[im 23/33]
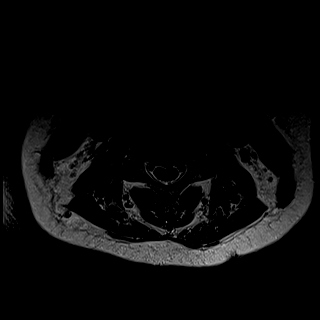
[im 28/33]
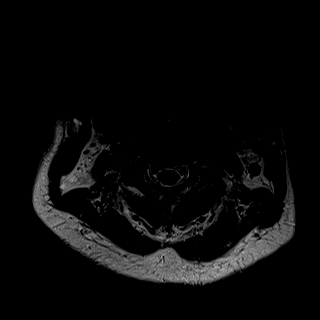
[im 33/33]
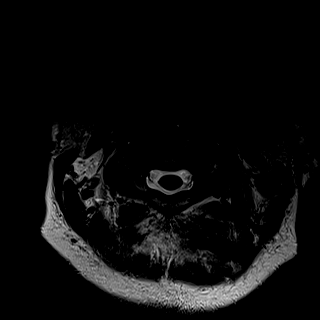

[26 of 48 positions shown; findings below may reference images not displayed]

FINDINGS: Alignment: Straightening of the expected cervical lordosis. No
significant spondylolisthesis.

Vertebrae: Body height is maintained. Mild degenerative endplate
edema at C6-C7. Elsewhere, no significant marrow edema or focal
suspicious osseous lesion is identified. Ventral osteophytes, most
prominent at C4-C5.

Cord: No signal abnormality identified within the cervical spinal
cord.

Posterior Fossa, vertebral arteries, paraspinal tissues: Chronic
bilateral PCA territory infarcts within the occipital lobes,
incompletely imaged. Chronic lacunar infarcts within the pons.
Partially imaged chronic infarcts within the right cerebellar
hemisphere. Partially empty sella turcica. Chronic nonspecific
prominence of the nasopharyngeal/adenoid soft tissues with
associated cystic changes at this site. Flow voids preserved within
the imaged cervical vertebral arteries. No paraspinal mass or
collection.

Disc levels:

Unless otherwise stated, the level by level findings below have not
significantly changed from the prior MRI of 07/25/2020.

Mild-to-moderate disc degeneration at C6-C7. No more than mild disc
degeneration at the remaining levels.

C2-C3: Very shallow central disc protrusion. No significant spinal
canal or foraminal stenosis.

C3-C4: Small left center disc protrusion. Minimal bilateral
uncovertebral hypertrophy. The disc protrusion results in mild focal
effacement of the ventral thecal sac, contacting and minimally
flattening the ventral spinal cord. However, the dorsal CSF space is
maintained within the spinal canal. No significant foraminal
stenosis.

C4-C5: Small central disc protrusion. Mild bilateral covert above
hypertrophy. No significant spinal canal stenosis. Mild relative
left neural foraminal narrowing.

C5-C6: Slight disc bulge. Uncovertebral hypertrophy (predominantly
on the right). No significant spinal canal stenosis. Mild relative
right neural foraminal narrowing.

C6-C7: Disc bulge with endplate spurring and bilateral disc
osteophyte ridge/uncinate hypertrophy. Mild spinal canal stenosis.
Severe bilateral neural foraminal narrowing.

C7-T1: Slight disc bulge. No significant spinal canal or foraminal
stenosis.

T1-T2: This level is imaged in the sagittal plane only. Shallow disc
bulge. Minimal partial effacement of the ventral thecal sac. No
significant foraminal stenosis.
IMPRESSION: Cervical spondylosis, as outlined and having not significantly
changed from the prior MRI of 07/25/2020. Findings are most notably
as follows.

At C6-C7, there is mild-to-moderate disc degeneration with mild
degenerative endplate edema. Disc bulge with endplate spurring and
bilateral disc osteophyte ridge/uncinate hypertrophy. Mild spinal
canal narrowing. Severe bilateral foraminal stenosis.

No more than mild spinal canal narrowing at the remaining levels.
Most notably at C3-C4, a small left center disc protrusion focally
effaces the ventral thecal sac, contacting and mildly flattening the
ventral aspect of the spinal cord.

Additional sites of mild foraminal stenosis, as detailed.

Nonspecific straightening of the expected cervical lordosis.

Chronic bilateral PCA territory infarcts within the occipital lobes,
incompletely imaged. Incompletely imaged chronic infarcts within the
right cerebellar hemisphere. Chronic lacunar infarcts within the
pons.

Partially empty sella turcica. While this finding often reflects
incidental anatomic variation, it can also be associated with
idiopathic intracranial hypertension (pseudotumor cerebri).
